# Patient Record
Sex: Male | Born: 1965
Health system: Southern US, Community
[De-identification: ages and names within clinical notes are randomized; demographics above are authoritative.]

## PROBLEM LIST (undated history)

## (undated) DIAGNOSIS — J309 Allergic rhinitis, unspecified: Secondary | ICD-10-CM

## (undated) DIAGNOSIS — IMO0002 Reserved for concepts with insufficient information to code with codable children: Secondary | ICD-10-CM

## (undated) DIAGNOSIS — K649 Unspecified hemorrhoids: Secondary | ICD-10-CM

## (undated) DIAGNOSIS — M722 Plantar fascial fibromatosis: Secondary | ICD-10-CM

## (undated) DIAGNOSIS — T7840XA Allergy, unspecified, initial encounter: Secondary | ICD-10-CM

## (undated) HISTORY — DX: Reserved for concepts with insufficient information to code with codable children: IMO0002

## (undated) HISTORY — PX: WISDOM TOOTH EXTRACTION: SHX21

## (undated) HISTORY — DX: Allergic rhinitis, unspecified: J30.9

## (undated) HISTORY — DX: Allergy, unspecified, initial encounter: T78.40XA

## (undated) HISTORY — DX: Unspecified hemorrhoids: K64.9

## (undated) HISTORY — DX: Plantar fascial fibromatosis: M72.2

---

## 2009-05-26 ENCOUNTER — Ambulatory Visit: Payer: Self-pay | Admitting: Family Medicine

## 2009-05-26 DIAGNOSIS — Z8711 Personal history of peptic ulcer disease: Secondary | ICD-10-CM

## 2009-05-26 DIAGNOSIS — Z87898 Personal history of other specified conditions: Secondary | ICD-10-CM | POA: Insufficient documentation

## 2009-05-26 DIAGNOSIS — R519 Headache, unspecified: Secondary | ICD-10-CM | POA: Insufficient documentation

## 2009-05-26 DIAGNOSIS — R51 Headache: Secondary | ICD-10-CM

## 2009-05-26 DIAGNOSIS — J309 Allergic rhinitis, unspecified: Secondary | ICD-10-CM | POA: Insufficient documentation

## 2010-03-21 NOTE — Assessment & Plan Note (Signed)
Summary: NEW PT TO EST/CLE   Vital Signs:  Patient profile:   45 year old male Height:      73 inches Weight:      183 pounds BMI:     24.23 Temp:     98.2 degrees F oral Pulse rate:   72 / minute Pulse rhythm:   regular BP sitting:   120 / 78  (left arm) Cuff size:   regular  Vitals Entered By: Benny Lennert CMA Duncan Dull) (May 26, 2009 9:57 AM)  History of Present Illness: Chief complaint new patient to be established   Emergency planning/management officer.  Allergies  Migraines  Very bad allergies. Took allergy shots. Started to see Dr.  Callas. Still having a lot of drainage and sneezing.  A lot of blood and dryness.   Right now doing some saline. OTC sprays. Has been on Claritin of Allegra. Zyrtec.   Has tried some of the presciption nasal. Ongoing all year long.     Preventive Screening-Counseling & Management  Alcohol-Tobacco     Smoking Status: never  Caffeine-Diet-Exercise     Does Patient Exercise: no      Drug Use:  no.    Allergies (verified): No Known Drug Allergies  Past History:  Past Medical History: ALLERGIC RHINITIS (ICD-477.9) GASTRIC ULCER, HX OF (ICD-V12.71) Migraine  Past Surgical History: none  Family History: Family History Hypertension Family History of Cardiovascular disorder  Social History: Occupation: city of Armed forces operational officer, Cabin crew Married Never Smoked Alcohol use-no Drug use-no Regular exercise-no Occupation:  employed Smoking Status:  never Drug Use:  no Does Patient Exercise:  no  Review of Systems       allergies as above ROS: GEN: No acute illnesses, no fevers, chills, sweats, fatigue, weight loss, or URI sx. GI: No n/v/d Pulm: No SOB, cough, wheezing Interactive and getting along well at home.  Otherwise, ROS is as per the HPI.   Physical Exam  General:  Well-developed,well-nourished,in no acute distress; alert,appropriate and cooperative throughout examination Head:  Normocephalic and atraumatic without obvious  abnormalities. No apparent alopecia or balding. Ears:  External ear exam shows no significant lesions or deformities.  Otoscopic examination reveals clear canals, tympanic membranes are intact bilaterally without bulging, retraction, inflammation or discharge. Hearing is grossly normal bilaterally. Nose:  boggy swollen turbinates Mouth:  Oral mucosa and oropharynx without lesions or exudates.  Teeth in good repair. Neck:  No deformities, masses, or tenderness noted. Lungs:  Normal respiratory effort, chest expands symmetrically. Lungs are clear to auscultation, no crackles or wheezes. Heart:  Normal rate and regular rhythm. S1 and S2 normal without gallop, murmur, click, rub or other extra sounds. Extremities:  No clubbing, cyanosis, edema, or deformity noted with normal full range of motion of all joints.   Neurologic:  alert & oriented X3 and gait normal.   Cervical Nodes:  No lymphadenopathy noted Psych:  Cognition and judgment appear intact. Alert and cooperative with normal attention span and concentration. No apparent delusions, illusions, hallucinations   Impression & Recommendations:  Problem # 1:  ALLERGIC RHINITIS (ICD-477.9) Assessment New poor control, vasomotor rhinitis  Clarinex, Singular, Astepro  We will obtain records from the patient's prior physicians, reviewed Dr. Blair Heys records  His updated medication list for this problem includes:    Clarinex 5 Mg Tabs (Desloratadine) .Marland Kitchen... 1 by mouth daily (failed multiple meds, zyrtec, allegra, claritin, benadryl, nasocort, flonase)    Astepro 0.15 % Soln (Azelastine hcl) .Marland Kitchen... 2 sprays each nostril two times a day  Problem # 2:  HEADACHE (ICD-784.0) Assessment: New  His updated medication list for this problem includes:    Imitrex 50 Mg Tabs (Sumatriptan succinate) .Marland Kitchen... As needed for headache  Complete Medication List: 1)  Imitrex 50 Mg Tabs (Sumatriptan succinate) .... As needed for headache 2)  Singulair 10 Mg Tabs  (Montelukast sodium) .Marland Kitchen.. 1 by mouth daily 3)  Clarinex 5 Mg Tabs (Desloratadine) .Marland Kitchen.. 1 by mouth daily (failed multiple meds, zyrtec, allegra, claritin, benadryl, nasocort, flonase) 4)  Astepro 0.15 % Soln (Azelastine hcl) .... 2 sprays each nostril two times a day  Patient Instructions: 1)  CPX at your convenience 2)  Prephysical Labs, several days before, fasting 3)  BMP, HFP, FLP, CBC with diff, TSH, PSA: v77.91, v77.1, ,780.79, v76.44  Prescriptions: ASTEPRO 0.15 % SOLN (AZELASTINE HCL) 2 sprays each nostril two times a day  #1 x 11   Entered and Authorized by:   Hannah Beat MD   Signed by:   Hannah Beat MD on 05/26/2009   Method used:   Print then Give to Patient   RxID:   [id number] CLARINEX 5 MG TABS (DESLORATADINE) 1 by mouth daily (failed multiple meds, Zyrtec, Allegra, Claritin, Benadryl, Nasocort, FLonase)  #30 x 11   Entered and Authorized by:   Hannah Beat MD   Signed by:   Hannah Beat MD on 05/26/2009   Method used:   Print then Give to Patient   RxID:   [id number] SINGULAIR 10 MG TABS (MONTELUKAST SODIUM) 1 by mouth daily  #30 x 11   Entered and Authorized by:   Hannah Beat MD   Signed by:   Hannah Beat MD on 05/26/2009   Method used:   Print then Give to Patient   RxID:   [id number]   Current Allergies (reviewed today): No known allergies

## 2010-03-21 NOTE — Letter (Signed)
Summary: Records Dated 09-11-99 thru 08-27-07/James Kindl MD  Records Dated 09-11-99 thru 08-27-07/James Kindl MD   Imported By: Lanelle Bal 10/20/2009 11:49:37  _____________________________________________________________________  External Attachment:    Type:   Image     Comment:   External Document

## 2010-05-03 ENCOUNTER — Ambulatory Visit (INDEPENDENT_AMBULATORY_CARE_PROVIDER_SITE_OTHER): Payer: Self-pay | Admitting: Family Medicine

## 2010-05-03 ENCOUNTER — Encounter: Payer: Self-pay | Admitting: Family Medicine

## 2010-05-03 DIAGNOSIS — G43009 Migraine without aura, not intractable, without status migrainosus: Secondary | ICD-10-CM | POA: Insufficient documentation

## 2010-05-03 DIAGNOSIS — J309 Allergic rhinitis, unspecified: Secondary | ICD-10-CM

## 2010-05-09 NOTE — Assessment & Plan Note (Signed)
Summary: migraines/alc   Vital Signs:  Patient profile:   45 year old male Height:      73 inches Weight:      187 pounds BMI:     24.76 Temp:     97.9 degrees F oral Pulse rate:   72 / minute Pulse rhythm:   regular BP sitting:   120 / 78  (left arm) Cuff size:   regular  Vitals Entered By: Benny Lennert CMA Duncan Dull) (May 03, 2010 10:03 AM)  History of Present Illness: Chief complaint migraines  45 year old male:  migraines recently had a migraine, out of imitrex  allergic rhinitis: has had bleeding in his nose, occ. bloody discharge. singulair did not help.  zyrtec helps, astepro helped  Headache HPI:      The patient comes in for chronic management of stable headaches.  Since the last visit, the frequency of headaches have increased.  He has approximately 1 headaches per month.        Headache quality is throbbing or pulsating.  Precipitating factors consist of stress.  The headaches are associated with nausea, vomiting, and photophobia.        Positive alarm features include change in frequency from prior H/A's.  The patient denies first or worst H/A of life, change in severity from prior H/A's, change in features from prior H/A's, new onset H/A's in middle-age or later, new or progressive H/A lasting days, H/A's with Valsalva (cough/sneeze), mylagia, fever, malaise, weight loss, scalp tenderness, jaw claudication, focal neurologic symptoms, confusion, seizures, and impaired level of consciousness.         Headache Treatment History:      He has tried sumatriptan (Imitrex) which was effective.    Allergies (verified): No Known Drug Allergies  Past History:  Past medical, surgical, family and social histories (including risk factors) reviewed, and no changes noted (except as noted below).  Past Medical History: Reviewed history from 05/26/2009 and no changes required. ALLERGIC RHINITIS (ICD-477.9) GASTRIC ULCER, HX OF (ICD-V12.71) Migraine  Past Surgical  History: Reviewed history from 05/26/2009 and no changes required. none  Family History: Reviewed history from 05/26/2009 and no changes required. Family History Hypertension Family History of Cardiovascular disorder  Social History: Reviewed history from 05/26/2009 and no changes required. Occupation: city of Armed forces operational officer, Police Married Never Smoked Alcohol use-no Drug use-no Regular exercise-no  Review of Systems       REVIEW OF SYSTEMS GEN: No acute illnesses, no fever, chills, sweats. CV: No chest pain or SOB GI: n with ha Otherwise, pertinent positives and negatives are noted in the HPI.   Physical Exam  Additional Exam:  GEN: WDWN, NAD, Non-toxic, A & O x 3 HEENT: Atraumatic, Normocephalic. Neck supple. No masses, No LAD. Ears and Nose: No external deformity. CV: RRR, No M/G/R. No JVD. No thrill. No extra heart sounds. PULM: CTA B, no wheezes, crackles, rhonchi. No retractions. No resp. distress. No accessory muscle use. EXTR: No c/c/e NEURO: Normal gait.  PSYCH: Normally interactive. Conversant. Not depressed or anxious appearing.  Calm demeanor.     Impression & Recommendations:  Problem # 1:  MIGRAINE WITHOUT AURA (ICD-346.10) Assessment Deteriorated imitrex subcutaneously working well for acute 1 ha in the last year  The following medications were removed from the medication list:    Imitrex 50 Mg Tabs (Sumatriptan succinate) .Marland Kitchen... As needed for headache His updated medication list for this problem includes:    Sumatriptan Succinate Refill 6 Mg/0.106ml Soln (Sumatriptan succinate) .Marland KitchenMarland KitchenMarland KitchenMarland Kitchen 1  subq injection at onset of ha, may repeat in 2 hours  Problem # 2:  ALLERGIC RHINITIS (ICD-477.9) Assessment: Deteriorated c/w below refilled astelin  The following medications were removed from the medication list:    Clarinex 5 Mg Tabs (Desloratadine) .Marland Kitchen... 1 by mouth daily (failed multiple meds, zyrtec, allegra, claritin, benadryl, nasocort, flonase)    Astepro 0.15  % Soln (Azelastine hcl) .Marland Kitchen... 2 sprays each nostril two times a day His updated medication list for this problem includes:    Zyrtec Hives Relief 10 Mg Tabs (Cetirizine hcl) .Marland Kitchen... Take one tablet daily for allergies    Azelastine Hcl 137 Mcg/spray Soln (Azelastine hcl) .Marland Kitchen... 1 spray each nostril two times a day  Complete Medication List: 1)  Zyrtec Hives Relief 10 Mg Tabs (Cetirizine hcl) .... Take one tablet daily for allergies 2)  Sumatriptan Succinate Refill 6 Mg/0.37ml Soln (Sumatriptan succinate) .Marland Kitchen.. 1 subq injection at onset of ha, may repeat in 2 hours 3)  Azelastine Hcl 137 Mcg/spray Soln (Azelastine hcl) .Marland Kitchen.. 1 spray each nostril two times a day  Patient Instructions: 1)  CLARITIN, ZYRTEC, OR ALLEGRA. 2)  f/u CPX Prescriptions: AZELASTINE HCL 137 MCG/SPRAY SOLN (AZELASTINE HCL) 1 spray each nostril two times a day  #1 x 11   Entered and Authorized by:   Hannah Beat MD   Signed by:   Hannah Beat MD on 05/03/2010   Method used:   Print then Give to Patient   RxID:   [id number] SUMATRIPTAN SUCCINATE REFILL 6 MG/0.5ML SOLN (SUMATRIPTAN SUCCINATE) 1 subq injection at onset of HA, may repeat in 2 hours  #11 x 11   Entered and Authorized by:   Hannah Beat MD   Signed by:   Hannah Beat MD on 05/03/2010   Method used:   Electronically to        [facility]* (retail)       [address]       [place], [place]  [address]       Ph: [phone]       Fax: [phone]   RxID:   [id number]    Orders Added: 1)  Est. Patient Level IV [06301]    Current Allergies (reviewed today): No known allergies

## 2011-04-02 ENCOUNTER — Telehealth: Payer: Self-pay | Admitting: Family Medicine

## 2011-04-02 DIAGNOSIS — Z0000 Encounter for general adult medical examination without abnormal findings: Secondary | ICD-10-CM

## 2011-04-02 DIAGNOSIS — Z209 Contact with and (suspected) exposure to unspecified communicable disease: Secondary | ICD-10-CM

## 2011-04-02 NOTE — Telephone Encounter (Signed)
Same as husband

## 2011-04-02 NOTE — Telephone Encounter (Signed)
Labs ordered for future, patient notified

## 2011-04-02 NOTE — Telephone Encounter (Signed)
Patient has a physical appointment scheduled for 04/23/11 with labs prior.  Patient does not know what labs are ordered for physicals, but he needs the following labs done for an adoption.  Could you please add these labs to the order if necessary:  HB Antigen; HIV; Blood Count HB HCT WBC; Urinalysis

## 2011-04-02 NOTE — Telephone Encounter (Signed)
Can you help with this? For the HIV, etc. Can you use  "exposure communicable disease"

## 2011-04-16 ENCOUNTER — Other Ambulatory Visit (INDEPENDENT_AMBULATORY_CARE_PROVIDER_SITE_OTHER): Payer: 59

## 2011-04-16 DIAGNOSIS — Z209 Contact with and (suspected) exposure to unspecified communicable disease: Secondary | ICD-10-CM

## 2011-04-16 DIAGNOSIS — Z0000 Encounter for general adult medical examination without abnormal findings: Secondary | ICD-10-CM

## 2011-04-16 LAB — CBC WITH DIFFERENTIAL/PLATELET
Basophils Absolute: 0 10*3/uL (ref 0.0–0.1)
HCT: 42.8 % (ref 39.0–52.0)
Lymphs Abs: 1.5 10*3/uL (ref 0.7–4.0)
Monocytes Relative: 9.6 % (ref 3.0–12.0)
Neutrophils Relative %: 55.3 % (ref 43.0–77.0)
Platelets: 174 10*3/uL (ref 150.0–400.0)
RDW: 13.5 % (ref 11.5–14.6)

## 2011-04-16 LAB — URINALYSIS, ROUTINE W REFLEX MICROSCOPIC
Bilirubin Urine: NEGATIVE
Ketones, ur: NEGATIVE
Leukocytes, UA: NEGATIVE
Specific Gravity, Urine: 1.025 (ref 1.000–1.030)
Urobilinogen, UA: 0.2 (ref 0.0–1.0)

## 2011-04-17 LAB — HIV ANTIBODY (ROUTINE TESTING W REFLEX): HIV: NONREACTIVE

## 2011-04-20 ENCOUNTER — Encounter: Payer: Self-pay | Admitting: Family Medicine

## 2011-04-23 ENCOUNTER — Encounter: Payer: Self-pay | Admitting: Family Medicine

## 2011-04-23 ENCOUNTER — Ambulatory Visit (INDEPENDENT_AMBULATORY_CARE_PROVIDER_SITE_OTHER): Payer: 59 | Admitting: Family Medicine

## 2011-04-23 VITALS — BP 120/72 | HR 61 | Temp 97.8°F | Ht 74.0 in | Wt 182.0 lb

## 2011-04-23 DIAGNOSIS — Z0000 Encounter for general adult medical examination without abnormal findings: Secondary | ICD-10-CM

## 2011-04-23 DIAGNOSIS — Z125 Encounter for screening for malignant neoplasm of prostate: Secondary | ICD-10-CM

## 2011-04-23 LAB — PSA: PSA: 0.58 ng/mL (ref 0.10–4.00)

## 2011-04-23 LAB — LIPID PANEL
Cholesterol: 232 mg/dL — ABNORMAL HIGH (ref 0–200)
Total CHOL/HDL Ratio: 6
Triglycerides: 121 mg/dL (ref 0.0–149.0)
VLDL: 24.2 mg/dL (ref 0.0–40.0)

## 2011-04-23 LAB — LDL CHOLESTEROL, DIRECT: Direct LDL: 166.2 mg/dL

## 2011-04-23 LAB — BASIC METABOLIC PANEL
CO2: 28 mEq/L (ref 19–32)
Calcium: 9.5 mg/dL (ref 8.4–10.5)
Creatinine, Ser: 1 mg/dL (ref 0.4–1.5)

## 2011-04-23 NOTE — Progress Notes (Signed)
Patient Name: Ian Golden Date of Birth: 01-26-1966 Age: 46 y.o. Medical Record Number: [id number] Gender: male Date of Encounter: 04/23/2011  History of Present Illness:  Ian Golden is a 46 y.o. very pleasant male patient who presents with the following:  Flare up of some hemorrhoids  Last week back was aching some.   Father had colon polyps but no cancer - and removed frequently.  92 and still alive  Preventative Health Maintenance Visit:  Health Maintenance Summary Reviewed and updated, unless pt declines services.  Tobacco History Reviewed. Alcohol: No concerns, no excessive use Exercise Habits: Some activity, rec at least 30 mins 5 times a week STD concerns: no risk or activity to increase risk Drug Use: None Encouraged self-testicular check  Labs reviewed with the patient.   Lipids:    Component Value Date/Time   CHOL 232* 04/23/2011 0904   TRIG 121.0 04/23/2011 0904   HDL 39.40 04/23/2011 0904   LDLDIRECT 166.2 04/23/2011 0904   VLDL 24.2 04/23/2011 0904   CHOLHDL 6 04/23/2011 0904    CBC:    Component Value Date/Time   WBC 5.1 04/16/2011 0752   HGB 14.5 04/16/2011 0752   HCT 42.8 04/16/2011 0752   PLT 174.0 04/16/2011 0752   MCV 88.5 04/16/2011 0752   NEUTROABS 2.8 04/16/2011 0752   LYMPHSABS 1.5 04/16/2011 0752   MONOABS 0.5 04/16/2011 0752   EOSABS 0.3 04/16/2011 0752   BASOSABS 0.0 04/16/2011 0752    Basic Metabolic Panel:    Component Value Date/Time   NA 141 04/23/2011 0904   K 4.3 04/23/2011 0904   CL 106 04/23/2011 0904   CO2 28 04/23/2011 0904   BUN 20 04/23/2011 0904   CREATININE 1.0 04/23/2011 0904   GLUCOSE 86 04/23/2011 0904   CALCIUM 9.5 04/23/2011 0904    Lab Results  Component Value Date   PSA 0.58 04/23/2011   Patient Active Problem List  Diagnoses  . ALLERGIC RHINITIS  . GASTRIC ULCER, HX OF  . SHINGLES, HX OF  . MIGRAINE WITHOUT AURA   Past Medical History  Diagnosis Date  . Allergic rhinitis, cause unspecified   . Ulcer   . Migraine    No  past surgical history on file. History  Substance Use Topics  . Smoking status: Never Smoker   . Smokeless tobacco: Not on file  . Alcohol Use: No   No family history on file. No Known Allergies No current outpatient prescriptions on file prior to visit.     Past Medical History, Surgical History, Social History, Family History, Problem List, Medications, and Allergies have been reviewed and updated if relevant.  Review of Systems:  General: Denies fever, chills, sweats. No significant weight loss. Eyes: Denies blurring,significant itching ENT: Denies earache, sore throat, and hoarseness. Cardiovascular: Denies chest pains, palpitations, dyspnea on exertion Respiratory: Denies cough, dyspnea at rest,wheeezing Breast: no concerns about lumps GI: Denies nausea, vomiting, diarrhea, constipation, change in bowel habits, abdominal pain, melena, hematochezia GU: Denies penile discharge, ED, urinary flow / outflow problems. No STD concerns. Musculoskeletal: Denies back pain, joint pain - back pain last week Derm: Denies rash, itching Neuro: Denies  paresthesias, frequent falls, frequent headaches Psych: Denies depression, anxiety Endocrine: Denies cold intolerance, heat intolerance, polydipsia Heme: Denies enlarged lymph nodes Allergy: No hayfever  Physical Examination: Filed Vitals:   04/23/11 0810  BP: 120/72  Pulse: 61  Temp: 97.8 F (36.6 C)  TempSrc: Oral  Height: 6\' 2"  (1.88 m)  Weight: 182 lb (82.555  kg)  SpO2: 100%    Body mass index is 23.37 kg/(m^2).   Wt Readings from Last 3 Encounters:  04/23/11 182 lb (82.555 kg)  05/03/10 187 lb (84.823 kg)  05/26/09 183 lb (83.008 kg)    GEN: well developed, well nourished, no acute distress Eyes: conjunctiva and lids normal, PERRLA, EOMI ENT: TM clear, nares clear, oral exam WNL Neck: supple, no lymphadenopathy, no thyromegaly, no JVD Pulm: clear to auscultation and percussion, respiratory effort normal CV: regular  rate and rhythm, S1-S2, no murmur, rub or gallop, no bruits, peripheral pulses normal and symmetric, no cyanosis, clubbing, edema or varicosities Chest: no scars, masses GI: soft, non-tender; no hepatosplenomegaly, masses; active bowel sounds all quadrants GU: no hernia, testicular mass, penile discharge, or prostate enlargement Lymph: no cervical, axillary or inguinal adenopathy MSK: gait normal, muscle tone and strength WNL, no joint swelling, effusions, discoloration, crepitus  SKIN: clear, good turgor, color WNL, no rashes, lesions, or ulcerations Neuro: normal mental status, normal strength, sensation, and motion Psych: alert; oriented to person, place and time, normally interactive and not anxious or depressed in appearance.   Assessment and Plan: 1. Routine general medical examination at a health care facility  Lipid panel, Basic metabolic panel, PSA  2. Special screening for malignant neoplasm of prostate  PSA    The patient's preventative maintenance and recommended screening tests for an annual wellness exam were reviewed in full today. Brought up to date unless services declined.  Counselled on the importance of diet, exercise, and its role in overall health and mortality. The patient's FH and SH was reviewed, including their home life, tobacco status, and drug and alcohol status.  Doing well. Cont to exercise.  All paperwork for adoption CPX completed and scanned.

## 2011-05-31 ENCOUNTER — Other Ambulatory Visit: Payer: Self-pay | Admitting: *Deleted

## 2011-05-31 MED ORDER — SUMATRIPTAN SUCCINATE REFILL 6 MG/0.5ML ~~LOC~~ SOCT: 1.0000 | SUBCUTANEOUS | Status: DC | PRN

## 2011-06-13 ENCOUNTER — Ambulatory Visit (INDEPENDENT_AMBULATORY_CARE_PROVIDER_SITE_OTHER): Payer: 59 | Admitting: Family Medicine

## 2011-06-13 ENCOUNTER — Encounter: Payer: Self-pay | Admitting: Family Medicine

## 2011-06-13 ENCOUNTER — Telehealth: Payer: Self-pay | Admitting: Family Medicine

## 2011-06-13 VITALS — BP 120/86 | HR 83 | Temp 98.7°F | Ht 74.0 in | Wt 193.0 lb

## 2011-06-13 DIAGNOSIS — M6788 Other specified disorders of synovium and tendon, other site: Secondary | ICD-10-CM

## 2011-06-13 DIAGNOSIS — M7660 Achilles tendinitis, unspecified leg: Secondary | ICD-10-CM

## 2011-06-13 MED ORDER — NITROGLYCERIN 0.2 MG/HR TD PT24: MEDICATED_PATCH | TRANSDERMAL | Status: DC

## 2011-06-13 NOTE — Telephone Encounter (Signed)
Billing questions with labs to be addressed.

## 2011-06-13 NOTE — Progress Notes (Signed)
  Patient Name: Ian Golden Date of Birth: [birth year] Age: 46 y.o. Medical Record Number: [id number] Gender: male Date of Encounter: 06/13/2011  History of Present Illness:  Ian Golden is a 46 y.o. very pleasant male patient who presents with the following:  R achilles tendinopathy:  Pleasant patient who presents with a several month history of posterior heel pain.  No occult, abrupt onset. Has been more insidious in character. There is a dull ache present and worse with activity:  Prior home rehab: none Prior meds: none, occ prn tylenol and nsaids Orthosis / Braces: none  He does have a prominent posterior heel bone.  Past Medical History, Surgical History, Social History, Family History, Problem List, Medications, and Allergies have been reviewed and updated if relevant.  Review of Systems:  GEN: No fevers, chills. Nontoxic. Primarily MSK c/o today. MSK: Detailed in the HPI GI: tolerating PO intake without difficulty Neuro: No numbness, parasthesias, or tingling associated. Otherwise the pertinent positives of the ROS are noted above.    Physical Examination: Filed Vitals:   06/13/11 1522  BP: 120/86  Pulse: 83  Temp: 98.7 F (37.1 C)  TempSrc: Oral  Height: 6\' 2"  (1.88 m)  Weight: 193 lb (87.544 kg)  SpO2: 98%    Body mass index is 24.78 kg/(m^2).   GEN: Well-developed,well-nourished,in no acute distress; alert,appropriate and cooperative throughout examination HEENT: Normocephalic and atraumatic without obvious abnormalities. Ears, externally no deformities PULM: Breathing comfortably in no respiratory distress EXT: No clubbing, cyanosis, or edema PSYCH: Normally interactive. Cooperative during the interview. Pleasant. Friendly and conversant. Not anxious or depressed appearing. Normal, full affect.  Foot: b Echymosis: no Edema: no ROM: full LE B Gait: heel toe, non-antalgic MT pain: no Callus pattern: none Lateral Mall: NT Medial Mall: NT Talus:  NT Navicular: NT Cuboid: NT Calcaneous: NT Metatarsals: NT 5th MT: NT Phalanges: NT Achilles: PAINFUL TO PALPATE AT INSERTION ON LEFT, moderate NODULE on the R Plantar Fascia: NT Fat Pad: NT Peroneals: NT Post Tib: NT Great Toe: Nml motion Ant Drawer: neg ATFL: NT CFL: NT Deltoid: NT Sensation: intact   Assessment and Plan:  1. Achilles tendinosis    Achilles Tendinopathy:  Pathophysiology of achilles tendinopathy reviewed.  Additionally, I have given the patient the program emphasizing eccentric overloading detailed in the instructions based on Dr. Renato Gails work and protocols.  NTG patches  Recheck 6 weeks.   Orders Today: No orders of the defined types were placed in this encounter.    Medications Today: Meds ordered this encounter  Medications  . nitroGLYCERIN (NITRODUR - DOSED IN MG/24 HR) 0.2 mg/hr    Sig: NITRODUR brand. :Apply 1/4 of a patch to the affected area and change every 24 hours (re: tendinopathy)    Dispense:  30 patch    Refill:  4

## 2011-06-13 NOTE — Patient Instructions (Signed)
Achilles Rehab  Begin with easy walking, heel, toe and backwards  Calf raises on a step First lower and then raise on 1 foot If this is painful lower on 1 foot but do the heel raise on both feet  Begin with 3 sets of 10 repetitions  Increase by 5 repetitions every 3 days  Goal is 3 sets of 30 repetitions  Do with both knee straight and knee at 20 degrees of flexion  If pain persists at 3 sets of 30 - add backpack with 5 lbs Increase by 5 lbs per week to max of 30 lbs  

## 2011-06-18 ENCOUNTER — Telehealth: Payer: Self-pay | Admitting: Family Medicine

## 2011-07-05 ENCOUNTER — Telehealth: Payer: Self-pay | Admitting: Family Medicine

## 2011-07-05 NOTE — Telephone Encounter (Signed)
The patient's questions were forwarded to billing for review and awaiting response.  Meantime, patient has called to check on status and I explained I would send to another person/supervisor for response.  Sent to Anabell Diaz and Susan Large for review. 

## 2011-07-26 ENCOUNTER — Telehealth: Payer: Self-pay | Admitting: Family Medicine

## 2011-07-26 NOTE — Telephone Encounter (Signed)
Terri assisted with contacting [facility] and their labs were going to recode and rebill his and his wife's and said it was taken care of.  Informed patient of the updates and suggested that he may want to call [facility].  Patient was appreciative and verbalized understanding.

## 2011-10-17 ENCOUNTER — Encounter: Payer: Self-pay | Admitting: Family Medicine

## 2011-10-17 ENCOUNTER — Ambulatory Visit (INDEPENDENT_AMBULATORY_CARE_PROVIDER_SITE_OTHER): Payer: 59 | Admitting: Family Medicine

## 2011-10-17 VITALS — BP 138/82 | HR 76 | Temp 98.0°F | Wt 189.0 lb

## 2011-10-17 DIAGNOSIS — M7660 Achilles tendinitis, unspecified leg: Secondary | ICD-10-CM

## 2011-10-17 MED ORDER — SUMATRIPTAN SUCCINATE REFILL 6 MG/0.5ML ~~LOC~~ SOCT: 1.0000 | SUBCUTANEOUS | Status: DC | PRN

## 2011-10-17 NOTE — Patient Instructions (Addendum)
[facility]'s HEEL CUPS (I get them on [employer])  REFERRAL: GO THE THE FRONT ROOM AT THE ENTRANCE OF OUR CLINIC, NEAR CHECK IN. ASK FOR MARION. SHE WILL HELP YOU SET UP YOUR REFERRAL. DATE: TIME:

## 2011-10-17 NOTE — Progress Notes (Signed)
Nature conservation officer at [facility] [address] [place] [address] Phone: [phone] Fax: [phone]  Date:  10/17/2011   Name:  Ian Golden   DOB:  Nov 21, 1965   MRN:  [id number] Gender: male Age: 46 y.o.  PCP:  Hannah Beat, MD    Chief Complaint: Follow up for achilles heel pain   History of Present Illness:  Ian Golden is a 46 y.o. very pleasant male patient who presents with the following:  F/u on achilles tendinopathy, seen in 05/2011 with a prominent nodule, has been doing NTG patches, rehab with some eccentrics, trying to stretch, with continued significant pain and nodule. Limiting his activity  Past Medical History, Surgical History, Social History, Family History, Problem List, Medications, and Allergies have been reviewed and updated if relevant.  Current Outpatient Prescriptions on File Prior to Visit  Medication Sig Dispense Refill  . nitroGLYCERIN (NITRODUR - DOSED IN MG/24 HR) 0.2 mg/hr NITRODUR brand. :Apply 1/4 of a patch to the affected area and change every 24 hours (re: tendinopathy)  30 patch  4  . SUMAtriptan Succinate Refill 6 MG/0.5ML SOLN Inject 1 Syringe into the skin as needed. Inject one syringe subcutaneously at onset of headache. May repeat in 2 hours if needed as directedInject one syringe subcutaneously at onset of headache. May repeat in 2 hours if needed as directed  5 Syringe  0    Review of Systems:  GEN: No fevers, chills. Nontoxic. Primarily MSK c/o today. MSK: Detailed in the HPI GI: tolerating PO intake without difficulty Neuro: No numbness, parasthesias, or tingling associated. Otherwise the pertinent positives of the ROS are noted above.    Physical Examination: Filed Vitals:   10/17/11 1520  BP: 138/82  Pulse: 76  Temp: 98 F (36.7 C)   Filed Vitals:   10/17/11 1520  Weight: 189 lb (85.73 kg)   There is no height on file to calculate BMI. Ideal Body Weight:     GEN: Well-developed,well-nourished,in no  acute distress; alert,appropriate and cooperative throughout examination HEENT: Normocephalic and atraumatic without obvious abnormalities. Ears, externally no deformities PULM: Breathing comfortably in no respiratory distress EXT: No clubbing, cyanosis, or edema PSYCH: Normally interactive. Cooperative during the interview. Pleasant. Friendly and conversant. Not anxious or depressed appearing. Normal, full affect.  Foot: R Echymosis: no Edema: no ROM: full LE B Gait: heel toe, non-antalgic MT pain: no Callus pattern: none Lateral Mall: NT Medial Mall: NT Talus: NT Navicular: NT Cuboid: NT Calcaneous: NT Metatarsals: NT 5th MT: NT Phalanges: NT Achilles: PAINFUL TO PALPATE AT INSERTION ON LEFT, large NODULE Plantar Fascia: NT Fat Pad: NT Peroneals: NT Post Tib: NT Great Toe: Nml motion Ant Drawer: neg ATFL: NT CFL: NT Deltoid: NT Cavus long arch Sensation: intact  Diagnostic Ultrasound Evaluation Terason t3000, MSK ultrasound, MSK probe Anatomy scanned: R achilles Indication: Pain Findings: no evidence of tearing. There is a large nodule near the insertion. There is also some evidence of a small amount of retrocalcaneal bursitis visualized. On transverse view, there is a classical bull's-eye appearance around the nodule compared to the tendon more proximally.   Assessment and Plan:  1. Achilles tendinitis  Ambulatory referral to Physical Therapy   Six-month history of Achilles tendinopathy. Large nodule. Discussed at it will take some time on the order of months for the nodule to progress and become normal.  Physical therapy assistance.  Focus rehabilitation on eccentric draining at this point.  I made a Poron lift and both  of his shoes he was wearing advised him to get some heel cups for his others.  Orders Today:  Orders Placed This Encounter  Procedures  . Ambulatory referral to Physical Therapy    Referral Priority:  Routine    Referral Type:  Physical  Medicine    Referral Reason:  Specialty Services Required    Requested Specialty:  Physical Therapy    Number of Visits Requested:  1    Medications Today: (Includes new updates added during medication reconciliation) Meds ordered this encounter  Medications  . SUMAtriptan Succinate Refill 6 MG/0.5ML SOLN    Sig: Inject 1 Syringe into the skin as needed. Inject one syringe subcutaneously at onset of headache. May repeat in 2 hours if needed as directedInject one syringe subcutaneously at onset of headache. May repeat in 2 hours if needed as directed    Dispense:  5 Syringe    Refill:  5    Medications Discontinued: Medications Discontinued During This Encounter  Medication Reason  . SUMAtriptan Succinate Refill 6 MG/0.5ML SOLN Reorder     Hannah Beat, MD

## 2011-11-08 ENCOUNTER — Ambulatory Visit (INDEPENDENT_AMBULATORY_CARE_PROVIDER_SITE_OTHER): Payer: 59 | Admitting: Pediatrics

## 2011-11-08 DIAGNOSIS — Z7681 Expectant parent(s) prebirth pediatrician visit: Secondary | ICD-10-CM

## 2011-11-09 NOTE — Progress Notes (Signed)
Counseled on physical findings and interpretatoin of labs for possible adoptee

## 2011-12-19 ENCOUNTER — Telehealth: Payer: Self-pay

## 2011-12-19 NOTE — Telephone Encounter (Signed)
Pt has billing issue and request contact # ;pt given [phone].

## 2012-12-19 ENCOUNTER — Other Ambulatory Visit: Payer: Self-pay | Admitting: Family Medicine

## 2012-12-19 NOTE — Telephone Encounter (Signed)
Last office visit 10/17/2011.  Ok to refill?

## 2013-04-09 ENCOUNTER — Ambulatory Visit: Payer: 59 | Admitting: Family Medicine

## 2013-05-18 ENCOUNTER — Encounter: Payer: Self-pay | Admitting: Family Medicine

## 2013-05-18 ENCOUNTER — Ambulatory Visit (INDEPENDENT_AMBULATORY_CARE_PROVIDER_SITE_OTHER): Payer: 59 | Admitting: Family Medicine

## 2013-05-18 VITALS — BP 128/92 | HR 72 | Temp 98.3°F | Ht 73.5 in | Wt 188.2 lb

## 2013-05-18 DIAGNOSIS — K649 Unspecified hemorrhoids: Secondary | ICD-10-CM

## 2013-05-18 DIAGNOSIS — Z125 Encounter for screening for malignant neoplasm of prostate: Secondary | ICD-10-CM

## 2013-05-18 DIAGNOSIS — L239 Allergic contact dermatitis, unspecified cause: Secondary | ICD-10-CM

## 2013-05-18 DIAGNOSIS — Z131 Encounter for screening for diabetes mellitus: Secondary | ICD-10-CM

## 2013-05-18 DIAGNOSIS — Z13220 Encounter for screening for lipoid disorders: Secondary | ICD-10-CM

## 2013-05-18 DIAGNOSIS — R5383 Other fatigue: Secondary | ICD-10-CM

## 2013-05-18 DIAGNOSIS — L259 Unspecified contact dermatitis, unspecified cause: Secondary | ICD-10-CM

## 2013-05-18 DIAGNOSIS — Z0000 Encounter for general adult medical examination without abnormal findings: Secondary | ICD-10-CM

## 2013-05-18 DIAGNOSIS — R5381 Other malaise: Secondary | ICD-10-CM

## 2013-05-18 MED ORDER — FLUOCINONIDE 0.05 % EX OINT: 1.0000 "application " | TOPICAL_OINTMENT | Freq: Two times a day (BID) | CUTANEOUS | Status: DC

## 2013-05-18 MED ORDER — LIDOCAINE-HYDROCORTISONE ACE 3-0.5 % RE CREA: 1.0000 | TOPICAL_CREAM | Freq: Two times a day (BID) | RECTAL | Status: DC

## 2013-05-18 NOTE — Progress Notes (Signed)
Date:  05/18/2013   Name:  Ian Golden   DOB:  02-Jan-1966   MRN:  [id number] Gender: male Age: [age] y.o.  Primary Physician:  Hannah BeatSpencer Katrell Milhorn, MD   Chief Complaint: Annual Exam   Subjective:   History of Present Illness:  Ian Golden is a 48 y.o. pleasant patient who presents with the following:  Preventative Health Maintenance Visit and acute issues.  Health Maintenance Summary Reviewed and updated, unless pt declines services.  Tobacco History Reviewed. 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 Tetanus/tdap  10/02/1983  . Influenza Vaccine  09/19/2013     There is no immunization history on file for this patient.  Feeling like going to fall apart.   Tdap - ask the city. Flu - city provides.   Hemorrhoids. Anumantle. He is having a flare-up, and he does sometimes. Tried some preparation H, which has not helped all that much.  Rash - abd. This has been there many months. He did used to wear a gun belt in the region, but no direct contact. No historic eczema.    Patient Active Problem List   Diagnosis Date Noted  . Hemorrhoids 05/19/2013  . MIGRAINE WITHOUT AURA 05/03/2010  . ALLERGIC RHINITIS 05/26/2009  . GASTRIC ULCER, HX OF 05/26/2009  . SHINGLES, HX OF 05/26/2009    Past Medical History  Diagnosis Date  . Allergic rhinitis, cause unspecified   . Ulcer   . Migraine   . Hemorrhoids     No past surgical history on file.  History   Social History  . Marital Status: Single    Spouse Name: N/A    Number of Children: 2  . Years of Education: N/A   [place]up[place] [employer] [place]    [employer] Enforcement   Social History Main Topics  . Smoking status: Never Smoker   . Smokeless tobacco: Never Used    . Alcohol Use: No  . Drug Use: No  . Sexual Activity: Yes    Partners: Female   Other Topics Concern  . Not on file   Social History Narrative   Married, 2 adopted children   Hydrographic surveyorLaw Enforcement officer   Regular exercise- no    No family history on file.  No Known Allergies  Medication list has been reviewed and updated.  Review of Systems:  General: Denies fever, chills, sweats. No significant weight loss. Eyes: Denies blurring,significant itching ENT: Denies earache, sore throat, and hoarseness. Cardiovascular: Denies chest pains, palpitations, dyspnea on exertion Respiratory: Denies cough, dyspnea at rest,wheeezing Breast: no concerns about lumps GI: Denies nausea, vomiting, diarrhea, constipation, change in bowel habits, abdominal pain, melena, hematochezia. HEMORRHOIDS GU: Denies penile discharge, ED, urinary flow / outflow problems. No STD concerns. Musculoskeletal: Denies back pain, joint pain Derm: as above Neuro: Denies  paresthesias, frequent falls, frequent headaches Psych: Denies depression, anxiety Endocrine: Denies cold intolerance, heat intolerance, polydipsia Heme: Denies enlarged lymph nodes Allergy: No hayfever  Objective:   Physical Examination: BP 128/92  Pulse 72  Temp(Src) 98.3 F (36.8 C) (Oral)  Ht 6' 1.5" (1.867 m)  Wt 188 lb 4 oz (85.39 kg)  BMI 24.50 kg/m2 Ideal Body Weight: Weight in (lb) to have BMI = 25: 191.7  GEN: well developed, well nourished, no acute distress Eyes: conjunctiva and lids normal, PERRLA, EOMI ENT: TM clear, nares clear, oral exam WNL Neck: supple, no lymphadenopathy, no thyromegaly, no JVD Pulm: clear to auscultation and percussion, respiratory effort normal CV: regular rate and rhythm, S1-S2, no murmur, rub or gallop, no bruits, peripheral pulses normal and symmetric, no cyanosis, clubbing, edema or varicosities GI: soft, non-tender; no hepatosplenomegaly, masses; active bowel sounds all quadrants GU: no  hernia, testicular mass, penile discharge Lymph: no cervical, axillary or inguinal adenopathy MSK: gait normal, muscle tone and strength WNL, no joint swelling, effusions, discoloration, crepitus  SKIN: flat, scaly rash periumbilical region, 1 small area on L leg Neuro: normal mental status, normal strength, sensation, and motion Psych: alert; oriented to person, place and time, normally interactive and not anxious or depressed in appearance.  All labs reviewed with patient.  Lipids:    Component Value Date/Time   CHOL 232* 04/23/2011 0904   TRIG 121.0 04/23/2011 0904   HDL 39.40 04/23/2011 0904   LDLDIRECT 166.2 04/23/2011 0904   VLDL 24.2 04/23/2011 0904   CHOLHDL 6 04/23/2011 0904    CBC:    Component Value Date/Time   WBC 5.1 04/16/2011 0752   HGB 14.5 04/16/2011 0752   HCT 42.8 04/16/2011 0752   PLT 174.0 04/16/2011 0752   MCV 88.5 04/16/2011 0752   NEUTROABS 2.8 04/16/2011 0752   LYMPHSABS 1.5 04/16/2011 0752   MONOABS 0.5 04/16/2011 0752   EOSABS 0.3 04/16/2011 0752   BASOSABS 0.0 04/16/2011 0752    Basic Metabolic Panel:    Component Value Date/Time   NA 141 04/23/2011 0904   K 4.3 04/23/2011 0904   CL 106 04/23/2011 0904   CO2 28 04/23/2011 0904   BUN 20 04/23/2011 0904   CREATININE 1.0 04/23/2011 0904   GLUCOSE 86 04/23/2011 0904   CALCIUM 9.5 04/23/2011 0904    No results found for this basename: ALT, AST, GGT, ALKPHOS, BILITOT    No results found for this basename: TSH    Lab Results  Component Value Date   PSA 0.58 04/23/2011    Assessment & Plan:   Health Maintenance Exam: The patient's preventative maintenance and recommended screening tests for an annual wellness exam were reviewed in full today. Brought up to date unless services declined.  Counselled on the importance of diet, exercise, and its role in overall health and mortality. The patient's FH and SH was reviewed, including their home life, tobacco status, and drug and alcohol status.  Routine general medical  examination at a health care facility  Special screening for malignant neoplasm of prostate - Plan: PSA  Screening for lipoid disorders - Plan: Lipid panel  Screening for diabetes mellitus - Plan: Basic metabolic panel  Other malaise and fatigue - Plan: CBC with Differential, Hepatic function panel  Hemorrhoids  Allergic dermatitis  Overall doing well  Likely rash dermatitis, less likely fungal. Lidex  Anamantle for hemorrhoids.  Increase exercise, work on weight.  Return in about 1 year (around 05/19/2014).  Orders Placed This Encounter  Procedures  . Basic metabolic panel  . CBC with Differential  . Hepatic function panel  . Lipid panel  . PSA   Patient's Medications  New Prescriptions   FLUOCINONIDE OINTMENT (LIDEX) 0.05 %    Apply 1 application topically 2 (two) times daily.   LIDOCAINE-HYDROCORTISONE (ANAMANTEL HC) 3-0.5 % CREA    Place 1 Applicatorful rectally 2 (two) times daily.  Previous Medications   SUMATRIPTAN SUCCINATE  REFILL 6 MG/0.5ML SOLN    Inject 1 Syringe into the skin as needed. Inject one syringe subcutaneously at onset of headache. May repeat in 2 hours if needed as directedInject one syringe subcutaneously at onset of headache. May repeat in 2 hours if needed as directed  Modified Medications   No medications on file  Discontinued Medications   NITROGLYCERIN (NITRODUR - DOSED IN MG/24 HR) 0.2 MG/HR    NITRODUR brand. :Apply 1/4 of a patch to the affected area and change every 24 hours (re: tendinopathy)   SUMATRIPTAN 6 MG/0.5ML SOAJ    INJECT ONE (1) SYRINGE SUBCUTANEOUSLY ATONSET OF HEADACHE. MAY REPEATIN TWO (2)HOURS IF NEEDED AS DIRECTED.   Patient Instructions  F/u for labs   Signed,  Karleen Hampshire T. Wynona Duhamel, MD, CAQ Sports Medicine  [employer] at [facility] [address] [place] [address] Phone: [phone] Fax: [phone]

## 2013-05-18 NOTE — Patient Instructions (Signed)
F/u for labs.

## 2013-05-18 NOTE — Progress Notes (Signed)
Pre visit review using our clinic review tool, if applicable. No additional management support is needed unless otherwise documented below in the visit note. 

## 2013-05-19 ENCOUNTER — Other Ambulatory Visit (INDEPENDENT_AMBULATORY_CARE_PROVIDER_SITE_OTHER): Payer: 59

## 2013-05-19 ENCOUNTER — Encounter: Payer: Self-pay | Admitting: Family Medicine

## 2013-05-19 DIAGNOSIS — Z131 Encounter for screening for diabetes mellitus: Secondary | ICD-10-CM

## 2013-05-19 DIAGNOSIS — K649 Unspecified hemorrhoids: Secondary | ICD-10-CM | POA: Insufficient documentation

## 2013-05-19 DIAGNOSIS — R5381 Other malaise: Secondary | ICD-10-CM

## 2013-05-19 DIAGNOSIS — Z125 Encounter for screening for malignant neoplasm of prostate: Secondary | ICD-10-CM

## 2013-05-19 DIAGNOSIS — Z13220 Encounter for screening for lipoid disorders: Secondary | ICD-10-CM

## 2013-05-19 DIAGNOSIS — R5383 Other fatigue: Principal | ICD-10-CM

## 2013-05-19 LAB — CBC WITH DIFFERENTIAL/PLATELET
Basophils Absolute: 0 10*3/uL (ref 0.0–0.1)
Basophils Relative: 0.5 % (ref 0.0–3.0)
EOS PCT: 6.5 % — AB (ref 0.0–5.0)
Eosinophils Absolute: 0.4 10*3/uL (ref 0.0–0.7)
HEMATOCRIT: 42 % (ref 39.0–52.0)
HEMOGLOBIN: 14.2 g/dL (ref 13.0–17.0)
LYMPHS ABS: 1.6 10*3/uL (ref 0.7–4.0)
LYMPHS PCT: 28.4 % (ref 12.0–46.0)
MCHC: 33.8 g/dL (ref 30.0–36.0)
MCV: 87.4 fl (ref 78.0–100.0)
MONOS PCT: 8.3 % (ref 3.0–12.0)
Monocytes Absolute: 0.5 10*3/uL (ref 0.1–1.0)
Neutro Abs: 3.2 10*3/uL (ref 1.4–7.7)
Neutrophils Relative %: 56.3 % (ref 43.0–77.0)
PLATELETS: 183 10*3/uL (ref 150.0–400.0)
RBC: 4.81 Mil/uL (ref 4.22–5.81)
RDW: 13.2 % (ref 11.5–14.6)
WBC: 5.7 10*3/uL (ref 4.5–10.5)

## 2013-05-19 LAB — HEPATIC FUNCTION PANEL
ALT: 22 U/L (ref 0–53)
AST: 25 U/L (ref 0–37)
Albumin: 3.9 g/dL (ref 3.5–5.2)
Alkaline Phosphatase: 79 U/L (ref 39–117)
BILIRUBIN TOTAL: 0.7 mg/dL (ref 0.3–1.2)
Bilirubin, Direct: 0.1 mg/dL (ref 0.0–0.3)
Total Protein: 6.7 g/dL (ref 6.0–8.3)

## 2013-05-19 LAB — LIPID PANEL
CHOL/HDL RATIO: 6
CHOLESTEROL: 203 mg/dL — AB (ref 0–200)
HDL: 36.7 mg/dL — ABNORMAL LOW (ref >39.00)
LDL Cholesterol: 155 mg/dL — ABNORMAL HIGH (ref 0–99)
Triglycerides: 57 mg/dL (ref 0.0–149.0)
VLDL: 11.4 mg/dL (ref 0.0–40.0)

## 2013-05-19 LAB — PSA: PSA: 0.58 ng/mL (ref 0.10–4.00)

## 2013-05-19 LAB — BASIC METABOLIC PANEL
BUN: 26 mg/dL — ABNORMAL HIGH (ref 6–23)
CALCIUM: 9.3 mg/dL (ref 8.4–10.5)
CO2: 25 mEq/L (ref 19–32)
Chloride: 106 mEq/L (ref 96–112)
Creatinine, Ser: 1 mg/dL (ref 0.4–1.5)
GFR: 80.8 mL/min (ref >60.00)
GLUCOSE: 87 mg/dL (ref 70–99)
POTASSIUM: 4.1 meq/L (ref 3.5–5.1)
SODIUM: 140 meq/L (ref 135–145)

## 2014-09-12 ENCOUNTER — Other Ambulatory Visit: Payer: Self-pay | Admitting: Family Medicine

## 2014-09-12 NOTE — Telephone Encounter (Signed)
Last office visit 05/18/2013. No future appointments scheduled.  Last refilled 05/18/2013 60 g with 3 refills.  Ok to refill?

## 2014-10-29 ENCOUNTER — Other Ambulatory Visit: Payer: Self-pay | Admitting: Family Medicine

## 2014-10-29 NOTE — Telephone Encounter (Signed)
Ok to refill once only, cpx in the next few months

## 2014-10-29 NOTE — Telephone Encounter (Signed)
Please call and schedule CPE with fasting labs for Dr. Copland. 

## 2014-10-29 NOTE — Telephone Encounter (Signed)
Last office visit 05/18/2013.  No future appointments scheduled. Last refilled 09/13/2014 for 60 g with no refills.  Refill?

## 2014-12-13 ENCOUNTER — Other Ambulatory Visit (INDEPENDENT_AMBULATORY_CARE_PROVIDER_SITE_OTHER): Payer: Commercial Managed Care - HMO

## 2014-12-13 DIAGNOSIS — Z0000 Encounter for general adult medical examination without abnormal findings: Secondary | ICD-10-CM

## 2014-12-13 DIAGNOSIS — Z79899 Other long term (current) drug therapy: Secondary | ICD-10-CM

## 2014-12-13 DIAGNOSIS — Z125 Encounter for screening for malignant neoplasm of prostate: Secondary | ICD-10-CM | POA: Diagnosis not present

## 2014-12-13 DIAGNOSIS — Z13220 Encounter for screening for lipoid disorders: Secondary | ICD-10-CM | POA: Diagnosis not present

## 2014-12-13 LAB — BASIC METABOLIC PANEL
BUN: 25 mg/dL — AB (ref 6–23)
CO2: 28 mEq/L (ref 19–32)
Calcium: 9.5 mg/dL (ref 8.4–10.5)
Chloride: 107 mEq/L (ref 96–112)
Creatinine, Ser: 1.01 mg/dL (ref 0.40–1.50)
GFR: 83.04 mL/min (ref >60.00)
GLUCOSE: 87 mg/dL (ref 70–99)
Potassium: 4.3 mEq/L (ref 3.5–5.1)
Sodium: 141 mEq/L (ref 135–145)

## 2014-12-13 LAB — CBC WITH DIFFERENTIAL/PLATELET
BASOS ABS: 0 10*3/uL (ref 0.0–0.1)
Basophils Relative: 0.6 % (ref 0.0–3.0)
Eosinophils Absolute: 0.3 10*3/uL (ref 0.0–0.7)
Eosinophils Relative: 5.3 % — ABNORMAL HIGH (ref 0.0–5.0)
HCT: 43.7 % (ref 39.0–52.0)
HEMOGLOBIN: 14.6 g/dL (ref 13.0–17.0)
LYMPHS ABS: 1.6 10*3/uL (ref 0.7–4.0)
Lymphocytes Relative: 29.7 % (ref 12.0–46.0)
MCHC: 33.4 g/dL (ref 30.0–36.0)
MCV: 88.2 fl (ref 78.0–100.0)
MONO ABS: 0.5 10*3/uL (ref 0.1–1.0)
Monocytes Relative: 9.3 % (ref 3.0–12.0)
Neutro Abs: 2.9 10*3/uL (ref 1.4–7.7)
Neutrophils Relative %: 55.1 % (ref 43.0–77.0)
Platelets: 181 10*3/uL (ref 150.0–400.0)
RBC: 4.95 Mil/uL (ref 4.22–5.81)
RDW: 12.8 % (ref 11.5–15.5)
WBC: 5.2 10*3/uL (ref 4.0–10.5)

## 2014-12-13 LAB — HEPATIC FUNCTION PANEL
ALT: 20 U/L (ref 0–53)
AST: 22 U/L (ref 0–37)
Albumin: 4 g/dL (ref 3.5–5.2)
Alkaline Phosphatase: 76 U/L (ref 39–117)
BILIRUBIN TOTAL: 0.4 mg/dL (ref 0.2–1.2)
Bilirubin, Direct: 0.1 mg/dL (ref 0.0–0.3)
Total Protein: 6.6 g/dL (ref 6.0–8.3)

## 2014-12-13 LAB — LIPID PANEL
Cholesterol: 217 mg/dL — ABNORMAL HIGH (ref 0–200)
HDL: 37.1 mg/dL — ABNORMAL LOW (ref >39.00)
LDL Cholesterol: 163 mg/dL — ABNORMAL HIGH (ref 0–99)
NonHDL: 180.13
TRIGLYCERIDES: 88 mg/dL (ref 0.0–149.0)
Total CHOL/HDL Ratio: 6
VLDL: 17.6 mg/dL (ref 0.0–40.0)

## 2014-12-13 LAB — PSA: PSA: 0.56 ng/mL (ref 0.10–4.00)

## 2014-12-13 NOTE — Addendum Note (Signed)
Addended by: Alvina ChouWALSH, TERRI J on: 12/13/2014 02:53 PM   Modules accepted: Orders

## 2014-12-14 ENCOUNTER — Other Ambulatory Visit (INDEPENDENT_AMBULATORY_CARE_PROVIDER_SITE_OTHER): Payer: Commercial Managed Care - HMO

## 2014-12-14 DIAGNOSIS — Z0000 Encounter for general adult medical examination without abnormal findings: Secondary | ICD-10-CM

## 2014-12-14 LAB — URINALYSIS, ROUTINE W REFLEX MICROSCOPIC
BILIRUBIN URINE: NEGATIVE
HGB URINE DIPSTICK: NEGATIVE
Ketones, ur: NEGATIVE
Leukocytes, UA: NEGATIVE
NITRITE: NEGATIVE
RBC / HPF: NONE SEEN (ref 0–?)
Specific Gravity, Urine: >=1.03 — AB (ref 1.000–1.030)
Total Protein, Urine: NEGATIVE
UROBILINOGEN UA: 0.2 (ref 0.0–1.0)
Urine Glucose: NEGATIVE
pH: 5.5 (ref 5.0–8.0)

## 2014-12-14 LAB — HEPATITIS B SURFACE ANTIGEN: HEP B S AG: NEGATIVE

## 2014-12-14 LAB — HIV ANTIBODY (ROUTINE TESTING W REFLEX): HIV: NONREACTIVE

## 2014-12-22 ENCOUNTER — Encounter: Payer: Self-pay | Admitting: Family Medicine

## 2014-12-22 ENCOUNTER — Ambulatory Visit (INDEPENDENT_AMBULATORY_CARE_PROVIDER_SITE_OTHER): Payer: Commercial Managed Care - HMO | Admitting: Family Medicine

## 2014-12-22 VITALS — BP 116/86 | HR 78 | Temp 98.2°F | Ht 73.25 in | Wt 194.0 lb

## 2014-12-22 DIAGNOSIS — Z0000 Encounter for general adult medical examination without abnormal findings: Secondary | ICD-10-CM | POA: Diagnosis not present

## 2014-12-22 NOTE — Progress Notes (Signed)
Pre visit review using our clinic review tool, if applicable. No additional management support is needed unless otherwise documented below in the visit note. 

## 2014-12-22 NOTE — Progress Notes (Signed)
Dr. Frederico Hamman T. Jordany Russett, MD, [facility] [place], [address] Phone: [phone] Fax: [phone]  12/22/2014  Patient: Ian Golden, MRN: [id number], DOB: 04/11/1965, 49 y.o.  Primary Physician:  Owens Loffler, MD   Chief Complaint  Patient presents with  . Annual Exam   Subjective:   MIHRAN LEBARRON is a 49 y.o. pleasant patient who presents with the following:  Preventative Health Maintenance Visit:  Health Maintenance Summary Reviewed and updated, unless pt declines services.  Tobacco History Reviewed. Alcohol: No concerns, no excessive use Exercise Habits: Some activity, rec at least 30 mins 5 times a week STD concerns: no risk or activity to increase risk Drug Use: None Encouraged self-testicular check  Health Maintenance  Topic Date Due  . TETANUS/TDAP  10/02/1983  . INFLUENZA VACCINE  12/23/2015 (Originally 09/20/2014)  . COLONOSCOPY  12/23/2015 (Originally 10/02/2014)  . HIV Screening  Completed    There is no immunization history on file for this patient. Patient Active Problem List   Diagnosis Date Noted  . Hemorrhoids 05/19/2013  . MIGRAINE WITHOUT AURA 05/03/2010  . ALLERGIC RHINITIS 05/26/2009  . GASTRIC ULCER, HX OF 05/26/2009  . SHINGLES, HX OF 05/26/2009   Past Medical History  Diagnosis Date  . Allergic rhinitis, cause unspecified   . Ulcer   . Migraine   . Hemorrhoids    No past surgical history on file. Social History   Social History  . Marital Status: Single    Spouse Name: N/A  . Number of Children: 2  . Years of Education: N/A   Occupational History  .  [employer] Enforcement   Social History Main Topics  . Smoking status: Never Smoker   . Smokeless tobacco: Never Used  . Alcohol Use: No  . Drug Use: No  . Sexual Activity:    Partners: Female   Other Topics Concern  . 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 No Known Allergies  Medication list has been reviewed and updated.   General: Denies fever, chills, sweats. No significant weight loss. Eyes: Denies blurring,significant itching ENT: Denies earache, sore throat, and hoarseness. Cardiovascular: Denies chest pains, palpitations, dyspnea on exertion Respiratory: Denies cough, dyspnea at rest,wheeezing Breast: no concerns about lumps GI: Denies nausea, vomiting, diarrhea, constipation, change in bowel habits, abdominal pain, melena, hematochezia GU: Denies penile discharge, ED, urinary flow / outflow problems. No STD concerns. Musculoskeletal: Denies back pain, joint pain Derm: Denies rash, itching Neuro: Denies  paresthesias, frequent falls, frequent headaches Psych: Denies depression, anxiety Endocrine: Denies cold intolerance, heat intolerance, polydipsia Heme: Denies enlarged lymph nodes Allergy: No hayfever  Objective:   BP 116/86 mmHg  Pulse 78  Temp(Src) 98.2 F (36.8 C) (Oral)  Ht 6' 1.25" (1.861 m)  Wt 194 lb (87.998 kg)  BMI 25.41 kg/m2 Ideal Body Weight: Weight in (lb) to have BMI = 25: 190.4  No exam data present  GEN: well developed, well nourished, no acute distress Eyes: conjunctiva and lids normal, PERRLA, EOMI ENT: TM clear, nares clear, oral exam WNL Neck: supple, no lymphadenopathy, no thyromegaly, no JVD Pulm: clear to auscultation and percussion, respiratory effort normal CV: regular rate and rhythm, S1-S2, no murmur, rub or gallop, no bruits, peripheral pulses normal and symmetric, no cyanosis, clubbing, edema or varicosities  GI: soft, non-tender; no hepatosplenomegaly, masses; active bowel sounds all quadrants GU: no hernia, testicular mass, penile discharge Lymph: no cervical, axillary or inguinal adenopathy MSK: gait normal, muscle tone and strength WNL, no joint swelling, effusions, discoloration, crepitus  SKIN: clear, good  turgor, color WNL, no rashes, lesions, or ulcerations Neuro: normal mental status, normal strength, sensation, and motion Psych: alert; oriented to person, place and time, normally interactive and not anxious or depressed in appearance. All labs reviewed with patient.  Lipids:    Component Value Date/Time   CHOL 217* 12/13/2014 0842   TRIG 88.0 12/13/2014 0842   HDL 37.10* 12/13/2014 0842   LDLDIRECT 166.2 04/23/2011 0904   VLDL 17.6 12/13/2014 0842   CHOLHDL 6 12/13/2014 0842   CBC: CBC Latest Ref Rng 12/13/2014 05/19/2013 04/16/2011  WBC 4.0 - 10.5 K/uL 5.2 5.7 5.1  Hemoglobin 13.0 - 17.0 g/dL 14.6 14.2 14.5  Hematocrit 39.0 - 52.0 % 43.7 42.0 42.8  Platelets 150.0 - 400.0 K/uL 181.0 183.0 102.5    Basic Metabolic Panel:    Component Value Date/Time   NA 141 12/13/2014 0842   K 4.3 12/13/2014 0842   CL 107 12/13/2014 0842   CO2 28 12/13/2014 0842   BUN 25* 12/13/2014 0842   CREATININE 1.01 12/13/2014 0842   GLUCOSE 87 12/13/2014 0842   CALCIUM 9.5 12/13/2014 0842   Hepatic Function Latest Ref Rng 12/13/2014 05/19/2013  Total Protein 6.0 - 8.3 g/dL 6.6 6.7  Albumin 3.5 - 5.2 g/dL 4.0 3.9  AST 0 - 37 U/L 22 25  ALT 0 - 53 U/L 20 22  Alk Phosphatase 39 - 117 U/L 76 79  Total Bilirubin 0.2 - 1.2 mg/dL 0.4 0.7  Bilirubin, Direct 0.0 - 0.3 mg/dL 0.1 0.1    No results found for: TSH Lab Results  Component Value Date   PSA 0.56 12/13/2014   PSA 0.58 05/19/2013   PSA 0.58 04/23/2011    Assessment and Plan:   Routine general medical examination at a health care facility  Health Maintenance Exam: The patient's preventative maintenance and recommended screening tests for an annual wellness exam were reviewed in full today. Brought up to date unless services declined.  Counselled on the importance of diet, exercise, and its role in overall health and mortality. The patient's FH and SH was reviewed, including their home life, tobacco status, and drug and alcohol  status.  Adoption paperwork completed.  Follow-up: No Follow-up on file. Unless noted, follow-up in 1 year for Health Maintenance Exam.  New Prescriptions   No medications on file   Modified Medications   No medications on file   No orders of the defined types were placed in this encounter.    Signed,  Maud Deed. Lakara Weiland, MD   Patient's Medications  New Prescriptions   No medications on file  Previous Medications   FLUOCINONIDE OINTMENT (LIDEX) 0.05 %    APPLY TO AFFECTED AREA TWICE A DAY   LIDOCAINE-HYDROCORTISONE (ANAMANTEL HC) 3-0.5 % CREA    Place 1 Applicatorful rectally 2 (two) times daily.   SUMATRIPTAN SUCCINATE REFILL 6 MG/0.5ML SOLN    Inject 1 Syringe into the skin as needed. Inject one syringe subcutaneously at onset of headache. May repeat in 2 hours if needed as directedInject one syringe subcutaneously at onset of headache. May repeat in 2 hours if needed as directed  Modified Medications   No medications on file  Discontinued Medications   No medications on file

## 2015-01-19 ENCOUNTER — Encounter: Payer: Self-pay | Admitting: Internal Medicine

## 2015-03-14 ENCOUNTER — Ambulatory Visit (AMBULATORY_SURGERY_CENTER): Payer: Self-pay | Admitting: *Deleted

## 2015-03-14 VITALS — Ht 73.0 in | Wt 200.0 lb

## 2015-03-14 DIAGNOSIS — Z1211 Encounter for screening for malignant neoplasm of colon: Secondary | ICD-10-CM

## 2015-03-14 NOTE — Progress Notes (Signed)
No egg or soy allergy known to patient  No issues with past sedation with any surgeries  or procedures, no intubation problems  No diet pills per patient No home 02 use per patient  No blood thinners   emmi video to  [email or web address]

## 2015-03-28 ENCOUNTER — Ambulatory Visit (AMBULATORY_SURGERY_CENTER): Payer: Commercial Managed Care - HMO | Admitting: Internal Medicine

## 2015-03-28 ENCOUNTER — Encounter: Payer: Self-pay | Admitting: Internal Medicine

## 2015-03-28 VITALS — BP 98/63 | HR 63 | Temp 97.1°F | Resp 13 | Ht 73.25 in | Wt 194.0 lb

## 2015-03-28 DIAGNOSIS — K5000 Crohn's disease of small intestine without complications: Secondary | ICD-10-CM | POA: Diagnosis not present

## 2015-03-28 DIAGNOSIS — K633 Ulcer of intestine: Secondary | ICD-10-CM | POA: Diagnosis not present

## 2015-03-28 DIAGNOSIS — Z1211 Encounter for screening for malignant neoplasm of colon: Secondary | ICD-10-CM | POA: Diagnosis present

## 2015-03-28 MED ORDER — SODIUM CHLORIDE 0.9 % IV SOLN: 500.0000 mL | INTRAVENOUS | Status: DC

## 2015-03-28 NOTE — Progress Notes (Signed)
Report to PACU, RN, vss, BBS= Clear.  

## 2015-03-28 NOTE — Progress Notes (Signed)
Called to room to assist during endoscopic procedure.  Patient ID and intended procedure confirmed with present staff. Received instructions for my participation in the procedure from the performing physician.  

## 2015-03-28 NOTE — Op Note (Signed)
[facility] [address] [place], [address]   COLONOSCOPY PROCEDURE REPORT  PATIENT: Ian Golden, Ian Golden  MR#: [id number] BIRTHDATE: July 23, 1965 , 49  yrs. old GENDER: male ENDOSCOPIST: Iva Boop, MD, [facility] PROCEDURE DATE:  03/28/2015 PROCEDURE:   Colonoscopy, screening and Colonoscopy with biopsy First Screening Colonoscopy - Avg.  risk and is 50 yrs.  old or older Yes.  Prior Negative Screening - Now for repeat screening. N/A  History of Adenoma - Now for follow-up colonoscopy & has been > or = to 3 yrs.  N/A  Polyps removed today? No Recommend repeat exam, <10 yrs? No ASA CLASS:   Class II INDICATIONS:Screening for colonic neoplasia and Colorectal Neoplasm Risk Assessment for this procedure is average risk. MEDICATIONS: Propofol 230 mg IV and Monitored anesthesia care  DESCRIPTION OF PROCEDURE:   After the risks benefits and alternatives of the procedure were thoroughly explained, informed consent was obtained.  The digital rectal exam revealed no abnormalities of the rectum, revealed the prostate was not enlarged, and revealed no prostatic nodules.   The [id number]  endoscope was introduced through the anus and advanced to the terminal ileum which was intubated for a short distance. No adverse events experienced.   The quality of the prep was good. (MiraLax was used)  The instrument was then slowly withdrawn as the colon was fully examined. Estimated blood loss is zero unless otherwise noted in this procedure report.    COLON FINDINGS: Multiple small non-bleeding and shallow ulcers were found at the ileocecal valve and in the terminal ileum.  Biopsies were taken at the center of the ulcers and at edge of the ulcers. The examination was otherwise normal.  Retroflexed views revealed no abnormalities. The time to cecum = 3.7 Withdrawal time = 9.5 The scope was withdrawn and the procedure completed. COMPLICATIONS: There were no immediate  complications.  ENDOSCOPIC IMPRESSION: 1.   Multiple small ulcers were found at the ileocecal valve and in the terminal ileum; biopsies were taken 2.   The examination was otherwise normal  RECOMMENDATIONS: 1.  Await pathology results - ? occult IBD, NSAID's are NOt on med list but could  be that or could be prep effect 2.  Repeat colonoscopy 10 years.  eSigned:  Iva Boop, MD, [facility] 03/28/2015 11:05 AM   cc: Dr. Hannah Beat  and The Patient

## 2015-03-28 NOTE — Patient Instructions (Addendum)
I did not find any polyps but there were some ulcers at the end of the small intestine - i took biopsies and will let you know results.  Next routine colonoscopy in 10 years - 2027  I appreciate the opportunity to care for you. Iva Boop, MD, FACG   YOU HAD AN ENDOSCOPIC PROCEDURE TODAY AT THE [facility]:   Refer to the procedure report that was given to you for any specific questions about what was found during the examination.  If the procedure report does not answer your questions, please call your gastroenterologist to clarify.  If you requested that your care partner not be given the details of your procedure findings, then the procedure report has been included in a sealed envelope for you to review at your convenience later.  YOU SHOULD EXPECT: Some feelings of bloating in the abdomen. Passage of more gas than usual.  Walking can help get rid of the air that was put into your GI tract during the procedure and reduce the bloating. If you had a lower endoscopy (such as a colonoscopy or flexible sigmoidoscopy) you may notice spotting of blood in your stool or on the toilet paper. If you underwent a bowel prep for your procedure, you may not have a normal bowel movement for a few days.  Please Note:  You might notice some irritation and congestion in your nose or some drainage.  This is from the oxygen used during your procedure.  There is no need for concern and it should clear up in a day or so.  SYMPTOMS TO REPORT IMMEDIATELY:   Following lower endoscopy (colonoscopy or flexible sigmoidoscopy):  Excessive amounts of blood in the stool  Significant tenderness or worsening of abdominal pains  Swelling of the abdomen that is new, acute  Fever of 100F or higher   For urgent or emergent issues, a gastroenterologist can be reached at any hour by calling [phone].   DIET: Your first meal following the procedure should be a small meal and then it is ok to  progress to your normal diet. Heavy or fried foods are harder to digest and may make you feel nauseous or bloated.  Likewise, meals heavy in dairy and vegetables can increase bloating.  Drink plenty of fluids but you should avoid alcoholic beverages for 24 hours.  ACTIVITY:  You should plan to take it easy for the rest of today and you should NOT DRIVE or use heavy machinery until tomorrow (because of the sedation medicines used during the test).    FOLLOW UP: Our staff will call the number listed on your records the next business day following your procedure to check on you and address any questions or concerns that you may have regarding the information given to you following your procedure. If we do not reach you, we will leave a message.  However, if you are feeling well and you are not experiencing any problems, there is no need to return our call.  We will assume that you have returned to your regular daily activities without incident.  If any biopsies were taken you will be contacted by phone or by letter within the next 1-3 weeks.  Please call us at [phone] if you have not heard about the biopsies in 3 weeks.    SIGNATURES/CONFIDENTIALITY: You and/or your care partner have signed paperwork which will be entered into your electronic medical record.  These signatures attest to the fact that that the  information above on your After Visit Summary has been reviewed and is understood.  Full responsibility of the confidentiality of this discharge information lies with you and/or your care-partner.

## 2015-03-29 ENCOUNTER — Telehealth: Payer: Self-pay

## 2015-03-29 NOTE — Telephone Encounter (Signed)
  Follow up Call-  Call back number 03/28/2015  Post procedure Call Back phone  # [phone]  Permission to leave phone message Yes     Patient questions:  Do you have a fever, pain , or abdominal swelling? No. Pain Score  0 *  Have you tolerated food without any problems? Yes.    Have you been able to return to your normal activities? Yes.    Do you have any questions about your discharge instructions: Diet   No. Medications  No. Follow up visit  No.  Do you have questions or concerns about your Care? No.  Actions: * If pain score is 4 or above: No action needed, pain <4.

## 2015-04-01 ENCOUNTER — Encounter: Payer: Self-pay | Admitting: Internal Medicine

## 2015-04-01 NOTE — Progress Notes (Signed)
Quick Note:  Ileal ulcers = ileitis - cause unclear Will observe OV 1 year ______

## 2015-06-27 ENCOUNTER — Encounter: Payer: Self-pay | Admitting: Family Medicine

## 2015-06-27 ENCOUNTER — Ambulatory Visit (INDEPENDENT_AMBULATORY_CARE_PROVIDER_SITE_OTHER): Payer: Commercial Managed Care - HMO | Admitting: Family Medicine

## 2015-06-27 VITALS — BP 104/68 | HR 75 | Temp 98.3°F | Ht 73.25 in | Wt 200.8 lb

## 2015-06-27 DIAGNOSIS — M7661 Achilles tendinitis, right leg: Secondary | ICD-10-CM | POA: Diagnosis not present

## 2015-06-27 MED ORDER — SUMATRIPTAN SUCCINATE REFILL 6 MG/0.5ML ~~LOC~~ SOCT: 1.0000 | SUBCUTANEOUS | Status: DC | PRN

## 2015-06-27 MED ORDER — NITROGLYCERIN 0.3 MG/HR TD PT24: MEDICATED_PATCH | TRANSDERMAL | Status: DC

## 2015-06-27 NOTE — Progress Notes (Signed)
Pre visit review using our clinic review tool, if applicable. No additional management support is needed unless otherwise documented below in the visit note. 

## 2015-06-27 NOTE — Progress Notes (Signed)
Dr. Karleen HampshireSpencer T. Rooney Gladwin, MD, CAQ Sports Medicine [facility] [address] [place], [id number] Phone: [phone] Fax: [phone]  06/27/2015  Patient: Ian Golden, MRN: [id number], DOB: 1965/06/05, 50 y.o.  Primary Physician:  Hannah BeatSpencer Lis Savitt, MD   Chief Complaint  Patient presents with  . Foot Pain    Right Heel x 2 months   Subjective:   Ian Golden is a 50 y.o. very pleasant male patient who presents with the following:  2 month h/o R sided achilles insertional pain. He has a history of B AT about 5 years ago, treated well with conservative measures. He was playing basketball and had pain afterwards. He is still able to plantarflex and has not had any bruising.  Past Medical History, Surgical History, Social History, Family History, Problem List, Medications, and Allergies have been reviewed and updated if relevant.  Patient Active Problem List   Diagnosis Date Noted  . Hemorrhoids 05/19/2013  . MIGRAINE WITHOUT AURA 05/03/2010  . ALLERGIC RHINITIS 05/26/2009  . GASTRIC ULCER, HX OF 05/26/2009  . SHINGLES, HX OF 05/26/2009    Past Medical History  Diagnosis Date  . Allergic rhinitis, cause unspecified   . Ulcer   . Migraine   . Hemorrhoids   . Allergy     Past Surgical History  Procedure Laterality Date  . Wisdom tooth extraction      with sedation    Social History   Social History  . Marital Status: Single    Spouse Name: N/A  . Number of Children: 2  . Years of Education: N/A   [place]up[place]  [employer]    [employer]   Social History Main Topics  . Smoking status: Never Smoker   . Smokeless tobacco: Never Used  . Alcohol Use: No  . Drug Use: No  . Sexual Activity:    Partners: Female   Other Topics Concern  . 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 Colon  polyps Father   . Colon cancer Neg Hx   . Esophageal cancer Neg Hx   . Rectal cancer Neg Hx   . Stomach cancer Neg Hx     No Known Allergies  Medication list reviewed and updated in full in [facility].  GEN: No fevers, chills. Nontoxic. Primarily MSK c/o today. MSK: Detailed in the HPI GI: tolerating PO intake without difficulty Neuro: No numbness, parasthesias, or tingling associated. Otherwise the pertinent positives of the ROS are noted above.   Objective:   BP 104/68 mmHg  Pulse 75  Temp(Src) 98.3 F (36.8 C) (Oral)  Ht 6' 1.25" (1.861 m)  Wt 200 lb 12 oz (91.06 kg)  BMI 26.29 kg/m2   GEN: Well-developed,well-nourished,in no acute distress; alert,appropriate and cooperative throughout examination HEENT: Normocephalic and atraumatic without obvious abnormalities. Ears, externally no deformities PULM: Breathing comfortably in no respiratory distress EXT: No clubbing, cyanosis, or edema PSYCH: Normally interactive. Cooperative during the interview. Pleasant. Friendly and conversant. Not anxious or depressed appearing. Normal, full affect.  Foot: R Echymosis: no Edema: no ROM: full LE B Gait: heel toe, non-antalgic MT pain: no Callus pattern: none Lateral Mall: NT Medial Mall: NT Talus: NT Navicular: NT Cuboid: NT Calcaneous: NT Metatarsals: NT 5th MT: NT Phalanges: NT Achilles: PAINFUL TO PALPATE  AT INSERTION ON R, SMALL NODULE - more laterally  Plantar Fascia: NT Fat Pad: NT Peroneals: NT Post Tib: NT Great Toe: Nml motion Ant Drawer: neg ATFL: NT CFL: NT Deltoid: NT Sensation: intact   Radiology: No results found.  Assessment and Plan:   Achilles tendinitis of right lower extremity  Based on history and exam, expect that he had a small tear or insertional injury at the outer aspect of achilles. Rest x 4 weeks. Crafted heel lift out of orthopedic felt.   In approx 4 weeks or when feeling better, start eccentric program.  Follow-up: if  having difficulties in 4-6 weeks  New Prescriptions   NITROGLYCERIN (NITRODUR - DOSED IN MG/24 HR) 0.3 MG/HR PATCH    Apply 1/4 of a patch to the affected area and change every 24 hours (re: tendinopathy)   Modified Medications   Modified Medication Previous Medication   SUMATRIPTAN SUCCINATE REFILL 6 MG/0.5ML SOCT SUMAtriptan Succinate Refill 6 MG/0.5ML SOLN      Inject 1 Syringe into the skin as needed. Inject one syringe subcutaneously at onset of headache. May repeat in 2 hours prn x 1    Inject 1 Syringe into the skin as needed. Inject one syringe subcutaneously at onset of headache. May repeat in 2 hours if needed as directedInject one syringe subcutaneously at onset of headache. May repeat in 2 hours if needed as directed   No orders of the defined types were placed in this encounter.    Signed,  Elpidio Galea. Glayds Insco, MD   Patient's Medications  New Prescriptions   NITROGLYCERIN (NITRODUR - DOSED IN MG/24 HR) 0.3 MG/HR PATCH    Apply 1/4 of a patch to the affected area and change every 24 hours (re: tendinopathy)  Previous Medications   FLUOCINONIDE OINTMENT (LIDEX) 0.05 %    APPLY TO AFFECTED AREA TWICE A DAY   LIDOCAINE-HYDROCORTISONE (ANAMANTEL HC) 3-0.5 % CREA    Place 1 Applicatorful rectally 2 (two) times daily.  Modified Medications   Modified Medication Previous Medication   SUMATRIPTAN SUCCINATE REFILL 6 MG/0.5ML SOCT SUMAtriptan Succinate Refill 6 MG/0.5ML SOLN      Inject 1 Syringe into the skin as needed. Inject one syringe subcutaneously at onset of headache. May repeat in 2 hours prn x 1    Inject 1 Syringe into the skin as needed. Inject one syringe subcutaneously at onset of headache. May repeat in 2 hours if needed as directedInject one syringe subcutaneously at onset of headache. May repeat in 2 hours if needed as directed  Discontinued Medications   No medications on file

## 2016-07-24 DIAGNOSIS — M722 Plantar fascial fibromatosis: Secondary | ICD-10-CM | POA: Diagnosis not present

## 2016-07-25 ENCOUNTER — Ambulatory Visit: Payer: Commercial Managed Care - HMO | Admitting: Family Medicine

## 2016-09-11 ENCOUNTER — Other Ambulatory Visit: Payer: Self-pay | Admitting: Nurse Practitioner

## 2016-09-11 ENCOUNTER — Ambulatory Visit
Admission: RE | Admit: 2016-09-11 | Discharge: 2016-09-11 | Disposition: A | Discharge status: Home / Self Care | Payer: Worker's Compensation | Source: Ambulatory Visit | Attending: Nurse Practitioner | Admitting: Nurse Practitioner

## 2016-09-11 DIAGNOSIS — M79672 Pain in left foot: Secondary | ICD-10-CM

## 2016-09-24 ENCOUNTER — Other Ambulatory Visit: Payer: Self-pay | Admitting: Family Medicine

## 2016-09-24 MED ORDER — FLUOCINONIDE 0.05 % EX OINT: TOPICAL_OINTMENT | Freq: Two times a day (BID) | CUTANEOUS | 0 refills | Status: DC

## 2016-09-24 NOTE — Telephone Encounter (Signed)
Last office visit 06/27/2015.  CPE scheduled for 10/28/2016.  Last refilled 10/29/2014 for 60 g with no refills.  Ok to refill?

## 2016-10-12 ENCOUNTER — Ambulatory Visit (INDEPENDENT_AMBULATORY_CARE_PROVIDER_SITE_OTHER): Payer: 59

## 2016-10-12 ENCOUNTER — Ambulatory Visit (INDEPENDENT_AMBULATORY_CARE_PROVIDER_SITE_OTHER): Payer: 59 | Admitting: Podiatry

## 2016-10-12 ENCOUNTER — Encounter: Payer: Self-pay | Admitting: Podiatry

## 2016-10-12 VITALS — BP 150/93 | HR 78 | Resp 16

## 2016-10-12 DIAGNOSIS — M722 Plantar fascial fibromatosis: Secondary | ICD-10-CM

## 2016-10-12 DIAGNOSIS — M7661 Achilles tendinitis, right leg: Secondary | ICD-10-CM

## 2016-10-12 MED ORDER — TRIAMCINOLONE ACETONIDE 10 MG/ML IJ SUSP
10.0000 mg | Freq: Once | INTRAMUSCULAR | Status: AC
  Administered 2016-10-12: 10 mg

## 2016-10-12 NOTE — Progress Notes (Signed)
   Subjective:    Patient ID: Ian Golden, male    DOB: [birth year], 51 y.o.   MRN: [id number]  HPI Chief Complaint  Patient presents with  . Foot Pain    Bilateral; Left foot-bottom of heel; Right foot; back of heel; Since March 2018; pt stated, "Thinks this is work related, I am a Emergency planning/management officer; worker's compensation was denied by the [employer]"      Review of Systems  HENT: Positive for sinus pain.   Musculoskeletal: Positive for arthralgias and gait problem.  All other systems reviewed and are negative.      Objective:   Physical Exam        Assessment & Plan:

## 2016-10-12 NOTE — Patient Instructions (Signed)
Plantar Fasciitis (Heel Spur Syndrome) with Rehab The plantar fascia is a fibrous, ligament-like, soft-tissue structure that spans the bottom of the foot. Plantar fasciitis is a condition that causes pain in the foot due to inflammation of the tissue. SYMPTOMS   Pain and tenderness on the underneath side of the foot.  Pain that worsens with standing or walking. CAUSES  Plantar fasciitis is caused by irritation and injury to the plantar fascia on the underneath side of the foot. Common mechanisms of injury include:  Direct trauma to bottom of the foot.  Damage to a small nerve that runs under the foot where the main fascia attaches to the heel bone.  Stress placed on the plantar fascia due to bone spurs. RISK INCREASES WITH:   Activities that place stress on the plantar fascia (running, jumping, pivoting, or cutting).  Poor strength and flexibility.  Improperly fitted shoes.  Tight calf muscles.  Flat feet.  Failure to warm-up properly before activity.  Obesity. PREVENTION  Warm up and stretch properly before activity.  Allow for adequate recovery between workouts.  Maintain physical fitness:  Strength, flexibility, and endurance.  Cardiovascular fitness.  Maintain a health body weight.  Avoid stress on the plantar fascia.  Wear properly fitted shoes, including arch supports for individuals who have flat feet.  PROGNOSIS  If treated properly, then the symptoms of plantar fasciitis usually resolve without surgery. However, occasionally surgery is necessary.  RELATED COMPLICATIONS   Recurrent symptoms that may result in a chronic condition.  Problems of the lower back that are caused by compensating for the injury, such as limping.  Pain or weakness of the foot during push-off following surgery.  Chronic inflammation, scarring, and partial or complete fascia tear, occurring more often from repeated injections.  TREATMENT  Treatment initially involves the  use of ice and medication to help reduce pain and inflammation. The use of strengthening and stretching exercises may help reduce pain with activity, especially stretches of the Achilles tendon. These exercises may be performed at home or with a therapist. Your caregiver may recommend that you use heel cups of arch supports to help reduce stress on the plantar fascia. Occasionally, corticosteroid injections are given to reduce inflammation. If symptoms persist for greater than 6 months despite non-surgical (conservative), then surgery may be recommended.   MEDICATION   If pain medication is necessary, then nonsteroidal anti-inflammatory medications, such as aspirin and ibuprofen, or other minor pain relievers, such as acetaminophen, are often recommended.  Do not take pain medication within 7 days before surgery.  Prescription pain relievers may be given if deemed necessary by your caregiver. Use only as directed and only as much as you need.  Corticosteroid injections may be given by your caregiver. These injections should be reserved for the most serious cases, because they may only be given a certain number of times.  HEAT AND COLD  Cold treatment (icing) relieves pain and reduces inflammation. Cold treatment should be applied for 10 to 15 minutes every 2 to 3 hours for inflammation and pain and immediately after any activity that aggravates your symptoms. Use ice packs or massage the area with a piece of ice (ice massage).  Heat treatment may be used prior to performing the stretching and strengthening activities prescribed by your caregiver, physical therapist, or athletic trainer. Use a heat pack or soak the injury in warm water.  SEEK IMMEDIATE MEDICAL CARE IF:  Treatment seems to offer no benefit, or the condition worsens.  Any medications   produce adverse side effects.  EXERCISES- RANGE OF MOTION (ROM) AND STRETCHING EXERCISES - Plantar Fasciitis (Heel Spur Syndrome) These exercises  may help you when beginning to rehabilitate your injury. Your symptoms may resolve with or without further involvement from your physician, physical therapist or athletic trainer. While completing these exercises, remember:   Restoring tissue flexibility helps normal motion to return to the joints. This allows healthier, less painful movement and activity.  An effective stretch should be held for at least 30 seconds.  A stretch should never be painful. You should only feel a gentle lengthening or release in the stretched tissue.  RANGE OF MOTION - Toe Extension, Flexion  Sit with your right / left leg crossed over your opposite knee.  Grasp your toes and gently pull them back toward the top of your foot. You should feel a stretch on the bottom of your toes and/or foot.  Hold this stretch for 10 seconds.  Now, gently pull your toes toward the bottom of your foot. You should feel a stretch on the top of your toes and or foot.  Hold this stretch for 10 seconds. Repeat  times. Complete this stretch 3 times per day.   RANGE OF MOTION - Ankle Dorsiflexion, Active Assisted  Remove shoes and sit on a chair that is preferably not on a carpeted surface.  Place right / left foot under knee. Extend your opposite leg for support.  Keeping your heel down, slide your right / left foot back toward the chair until you feel a stretch at your ankle or calf. If you do not feel a stretch, slide your bottom forward to the edge of the chair, while still keeping your heel down.  Hold this stretch for 10 seconds. Repeat 3 times. Complete this stretch 2 times per day.   STRETCH  Gastroc, Standing  Place hands on wall.  Extend right / left leg, keeping the front knee somewhat bent.  Slightly point your toes inward on your back foot.  Keeping your right / left heel on the floor and your knee straight, shift your weight toward the wall, not allowing your back to arch.  You should feel a gentle stretch  in the right / left calf. Hold this position for 10 seconds. Repeat 3 times. Complete this stretch 2 times per day.  STRETCH  Soleus, Standing  Place hands on wall.  Extend right / left leg, keeping the other knee somewhat bent.  Slightly point your toes inward on your back foot.  Keep your right / left heel on the floor, bend your back knee, and slightly shift your weight over the back leg so that you feel a gentle stretch deep in your back calf.  Hold this position for 10 seconds. Repeat 3 times. Complete this stretch 2 times per day.  STRETCH  Gastrocsoleus, Standing  Note: This exercise can place a lot of stress on your foot and ankle. Please complete this exercise only if specifically instructed by your caregiver.   Place the ball of your right / left foot on a step, keeping your other foot firmly on the same step.  Hold on to the wall or a rail for balance.  Slowly lift your other foot, allowing your body weight to press your heel down over the edge of the step.  You should feel a stretch in your right / left calf.  Hold this position for 10 seconds.  Repeat this exercise with a slight bend in your right /   left knee. Repeat 3 times. Complete this stretch 2 times per day.   STRENGTHENING EXERCISES - Plantar Fasciitis (Heel Spur Syndrome)  These exercises may help you when beginning to rehabilitate your injury. They may resolve your symptoms with or without further involvement from your physician, physical therapist or athletic trainer. While completing these exercises, remember:   Muscles can gain both the endurance and the strength needed for everyday activities through controlled exercises.  Complete these exercises as instructed by your physician, physical therapist or athletic trainer. Progress the resistance and repetitions only as guided.  STRENGTH - Towel Curls  Sit in a chair positioned on a non-carpeted surface.  Place your foot on a towel, keeping your heel  on the floor.  Pull the towel toward your heel by only curling your toes. Keep your heel on the floor. Repeat 3 times. Complete this exercise 2 times per day.  STRENGTH - Ankle Inversion  Secure one end of a rubber exercise band/tubing to a fixed object (table, pole). Loop the other end around your foot just before your toes.  Place your fists between your knees. This will focus your strengthening at your ankle.  Slowly, pull your big toe up and in, making sure the band/tubing is positioned to resist the entire motion.  Hold this position for 10 seconds.  Have your muscles resist the band/tubing as it slowly pulls your foot back to the starting position. Repeat 3 times. Complete this exercises 2 times per day.  Document Released: 02/05/2005 Document Revised: 04/30/2011 Document Reviewed: 05/20/2008 [employer] Patient Information 2014 [employer]. Achilles Tendinitis  with Rehab Achilles tendinitis is a disorder of the Achilles tendon. The Achilles tendon connects the large calf muscles (Gastrocnemius and Soleus) to the heel bone (calcaneus). This tendon is sometimes called the heel cord. It is important for pushing-off and standing on your toes and is important for walking, running, or jumping. Tendinitis is often caused by overuse and repetitive microtrauma. SYMPTOMS  Pain, tenderness, swelling, warmth, and redness may occur over the Achilles tendon even at rest.  Pain with pushing off, or flexing or extending the ankle.  Pain that is worsened after or during activity. CAUSES   Overuse sometimes seen with rapid increase in exercise programs or in sports requiring running and jumping.  Poor physical conditioning (strength and flexibility or endurance).  Running sports, especially training running down hills.  Inadequate warm-up before practice or play or failure to stretch before participation.  Injury to the tendon. PREVENTION   Warm up and stretch before practice or  competition.  Allow time for adequate rest and recovery between practices and competition.  Keep up conditioning.  Keep up ankle and leg flexibility.  Improve or keep muscle strength and endurance.  Improve cardiovascular fitness.  Use proper technique.  Use proper equipment (shoes, skates).  To help prevent recurrence, taping, protective strapping, or an adhesive bandage may be recommended for several weeks after healing is complete. PROGNOSIS   Recovery may take weeks to several months to heal.  Longer recovery is expected if symptoms have been prolonged.  Recovery is usually quicker if the inflammation is due to a direct blow as compared with overuse or sudden strain. RELATED COMPLICATIONS   Healing time will be prolonged if the condition is not correctly treated. The injury must be given plenty of time to heal.  Symptoms can reoccur if activity is resumed too soon.  Untreated, tendinitis may increase the risk of tendon rupture requiring additional time for recovery   and possibly surgery. TREATMENT   The first treatment consists of rest anti-inflammatory medication, and ice to relieve the pain.  Stretching and strengthening exercises after resolution of pain will likely help reduce the risk of recurrence. Referral to a physical therapist or athletic trainer for further evaluation and treatment may be helpful.  A walking boot or cast may be recommended to rest the Achilles tendon. This can help break the cycle of inflammation and microtrauma.  Arch supports (orthotics) may be prescribed or recommended by your caregiver as an adjunct to therapy and rest.  Surgery to remove the inflamed tendon lining or degenerated tendon tissue is rarely necessary and has shown less than predictable results. MEDICATION   Nonsteroidal anti-inflammatory medications, such as aspirin and ibuprofen, may be used for pain and inflammation relief. Do not take within 7 days before surgery. Take  these as directed by your caregiver. Contact your caregiver immediately if any bleeding, stomach upset, or signs of allergic reaction occur. Other minor pain relievers, such as acetaminophen, may also be used.  Pain relievers may be prescribed as necessary by your caregiver. Do not take prescription pain medication for longer than 4 to 7 days. Use only as directed and only as much as you need.  Cortisone injections are rarely indicated. Cortisone injections may weaken tendons and predispose to rupture. It is better to give the condition more time to heal than to use them. HEAT AND COLD  Cold is used to relieve pain and reduce inflammation for acute and chronic Achilles tendinitis. Cold should be applied for 10 to 15 minutes every 2 to 3 hours for inflammation and pain and immediately after any activity that aggravates your symptoms. Use ice packs or an ice massage.  Heat may be used before performing stretching and strengthening activities prescribed by your caregiver. Use a heat pack or a warm soak. SEEK MEDICAL CARE IF:  Symptoms get worse or do not improve in 2 weeks despite treatment.  New, unexplained symptoms develop. Drugs used in treatment may produce side effects.  EXERCISES:  RANGE OF MOTION (ROM) AND STRETCHING EXERCISES - Achilles Tendinitis  These exercises may help you when beginning to rehabilitate your injury. Your symptoms may resolve with or without further involvement from your physician, physical therapist or athletic trainer. While completing these exercises, remember:   Restoring tissue flexibility helps normal motion to return to the joints. This allows healthier, less painful movement and activity.  An effective stretch should be held for at least 30 seconds.  A stretch should never be painful. You should only feel a gentle lengthening or release in the stretched tissue.  STRETCH  Gastroc, Standing   Place hands on wall.  Extend right / left leg, keeping the  front knee somewhat bent.  Slightly point your toes inward on your back foot.  Keeping your right / left heel on the floor and your knee straight, shift your weight toward the wall, not allowing your back to arch.  You should feel a gentle stretch in the right / left calf. Hold this position for 10 seconds. Repeat 3 times. Complete this stretch 2 times per day.  STRETCH  Soleus, Standing   Place hands on wall.  Extend right / left leg, keeping the other knee somewhat bent.  Slightly point your toes inward on your back foot.  Keep your right / left heel on the floor, bend your back knee, and slightly shift your weight over the back leg so that you feel a   gentle stretch deep in your back calf.  Hold this position for 10 seconds. Repeat 3 times. Complete this stretch 2 times per day.  STRETCH  Gastrocsoleus, Standing  Note: This exercise can place a lot of stress on your foot and ankle. Please complete this exercise only if specifically instructed by your caregiver.   Place the ball of your right / left foot on a step, keeping your other foot firmly on the same step.  Hold on to the wall or a rail for balance.  Slowly lift your other foot, allowing your body weight to press your heel down over the edge of the step.  You should feel a stretch in your right / left calf.  Hold this position for 10 seconds.  Repeat this exercise with a slight bend in your knee. Repeat 3 times. Complete this stretch 2 times per day.   STRENGTHENING EXERCISES - Achilles Tendinitis These exercises may help you when beginning to rehabilitate your injury. They may resolve your symptoms with or without further involvement from your physician, physical therapist or athletic trainer. While completing these exercises, remember:   Muscles can gain both the endurance and the strength needed for everyday activities through controlled exercises.  Complete these exercises as instructed by your physician,  physical therapist or athletic trainer. Progress the resistance and repetitions only as guided.  You may experience muscle soreness or fatigue, but the pain or discomfort you are trying to eliminate should never worsen during these exercises. If this pain does worsen, stop and make certain you are following the directions exactly. If the pain is still present after adjustments, discontinue the exercise until you can discuss the trouble with your clinician.  STRENGTH - Plantar-flexors   Sit with your right / left leg extended. Holding onto both ends of a rubber exercise band/tubing, loop it around the ball of your foot. Keep a slight tension in the band.  Slowly push your toes away from you, pointing them downward.  Hold this position for 10 seconds. Return slowly, controlling the tension in the band/tubing. Repeat 3 times. Complete this exercise 2 times per day.   STRENGTH - Plantar-flexors   Stand with your feet shoulder width apart. Steady yourself with a wall or table using as little support as needed.  Keeping your weight evenly spread over the width of your feet, rise up on your toes.*  Hold this position for 10 seconds. Repeat 3 times. Complete this exercise 2 times per day.  *If this is too easy, shift your weight toward your right / left leg until you feel challenged. Ultimately, you may be asked to do this exercise with your right / left foot only.  STRENGTH  Plantar-flexors, Eccentric  Note: This exercise can place a lot of stress on your foot and ankle. Please complete this exercise only if specifically instructed by your caregiver.   Place the balls of your feet on a step. With your hands, use only enough support from a wall or rail to keep your balance.  Keep your knees straight and rise up on your toes.  Slowly shift your weight entirely to your right / left toes and pick up your opposite foot. Gently and with controlled movement, lower your weight through your right /  left foot so that your heel drops below the level of the step. You will feel a slight stretch in the back of your calf at the end position.  Use the healthy leg to help rise up onto   the balls of both feet, then lower weight only on the right / left leg again. Build up to 15 repetitions. Then progress to 3 consecutive sets of 15 repetitions.*  After completing the above exercise, complete the same exercise with a slight knee bend (about 30 degrees). Again, build up to 15 repetitions. Then progress to 3 consecutive sets of 15 repetitions.* Perform this exercise 2 times per day.  *When you easily complete 3 sets of 15, your physician, physical therapist or athletic trainer may advise you to add resistance by wearing a backpack filled with additional weight.  STRENGTH - Plantar Flexors, Seated   Sit on a chair that allows your feet to rest flat on the ground. If necessary, sit at the edge of the chair.  Keeping your toes firmly on the ground, lift your right / left heel as far as you can without increasing any discomfort in your ankle. Repeat 3 times. Complete this exercise 2 times a day.  

## 2016-10-12 NOTE — Progress Notes (Signed)
Subjective:    Patient ID: Ian Golden, male   DOB: 51 y.o.   MRN: [id number]   HPI patient presents with acute plantar fascial pain left 4 months duration and on the right is noted to have pain in the outside of the right Achilles tendon that's tender when pressed and making walking difficult. 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       Objective:  Physical Exam  Constitutional: He appears well-developed and well-nourished.  Cardiovascular: Intact distal pulses.   Pulmonary/Chest: Breath sounds normal.  Musculoskeletal: Normal range of motion.  Neurological: He is alert.  Skin: Skin is warm.  Nursing note and vitals reviewed.  neurovascular status intact muscle strength adequate range of motion within normal limits with patient noted to have exquisite discomfort plantar aspect left heel at the insertional point tendon into the calcaneus and moderate discomfort in the posterior lateral aspect of the right Achilles at the insertion into the calcaneus. Patient has moderate depression of the arch is found to have good digital perfusion well oriented 3     Assessment:   Acute plantar fasciitis left with Achilles tendinitis right lateral side      Plan:    H&P conditions reviewed and at this time for the left I went ahead and injected the plantar fascia 3 mg Kenalog 5 mg Xylocaine and I did discuss injection of the right explaining the chances for rupture associated with injection and he wants to take risk and I injected the lateral side of the Achilles right 3 mg dexamethasone Kenalog 5 mg Xylocaine and advised on reduced activity. I then applied fascial brace left and patient be seen back to recheck again in the next several weeks  X-rays reviewed of on the right for posterior heel spur formation with no indication of stress fracture

## 2016-10-23 ENCOUNTER — Other Ambulatory Visit: Payer: Self-pay | Admitting: Family Medicine

## 2016-10-23 DIAGNOSIS — Z125 Encounter for screening for malignant neoplasm of prostate: Secondary | ICD-10-CM

## 2016-10-23 DIAGNOSIS — Z0000 Encounter for general adult medical examination without abnormal findings: Secondary | ICD-10-CM

## 2016-10-25 ENCOUNTER — Ambulatory Visit: Payer: 59 | Admitting: Podiatry

## 2016-10-29 ENCOUNTER — Other Ambulatory Visit: Payer: Self-pay

## 2016-11-05 ENCOUNTER — Encounter: Payer: Self-pay | Admitting: Family Medicine

## 2016-11-07 ENCOUNTER — Encounter: Payer: Self-pay | Admitting: Family Medicine

## 2016-11-14 ENCOUNTER — Other Ambulatory Visit (INDEPENDENT_AMBULATORY_CARE_PROVIDER_SITE_OTHER): Payer: 59

## 2016-11-14 DIAGNOSIS — Z125 Encounter for screening for malignant neoplasm of prostate: Secondary | ICD-10-CM | POA: Diagnosis not present

## 2016-11-14 DIAGNOSIS — Z0000 Encounter for general adult medical examination without abnormal findings: Secondary | ICD-10-CM | POA: Diagnosis not present

## 2016-11-14 LAB — CBC WITH DIFFERENTIAL/PLATELET
Basophils Absolute: 0 10*3/uL (ref 0.0–0.1)
Basophils Relative: 0.7 % (ref 0.0–3.0)
EOS PCT: 7.9 % — AB (ref 0.0–5.0)
Eosinophils Absolute: 0.4 10*3/uL (ref 0.0–0.7)
HCT: 42.8 % (ref 39.0–52.0)
HEMOGLOBIN: 14 g/dL (ref 13.0–17.0)
Lymphocytes Relative: 26.3 % (ref 12.0–46.0)
Lymphs Abs: 1.3 10*3/uL (ref 0.7–4.0)
MCHC: 32.8 g/dL (ref 30.0–36.0)
MCV: 85.4 fl (ref 78.0–100.0)
MONO ABS: 0.4 10*3/uL (ref 0.1–1.0)
Monocytes Relative: 8.5 % (ref 3.0–12.0)
Neutro Abs: 2.9 10*3/uL (ref 1.4–7.7)
Neutrophils Relative %: 56.6 % (ref 43.0–77.0)
Platelets: 192 10*3/uL (ref 150.0–400.0)
RBC: 5.01 Mil/uL (ref 4.22–5.81)
RDW: 14 % (ref 11.5–15.5)
WBC: 5.1 10*3/uL (ref 4.0–10.5)

## 2016-11-14 LAB — BASIC METABOLIC PANEL
BUN: 23 mg/dL (ref 6–23)
CO2: 30 mEq/L (ref 19–32)
Calcium: 9.4 mg/dL (ref 8.4–10.5)
Chloride: 106 mEq/L (ref 96–112)
Creatinine, Ser: 1.06 mg/dL (ref 0.40–1.50)
GFR: 77.94 mL/min (ref >60.00)
Glucose, Bld: 99 mg/dL (ref 70–99)
POTASSIUM: 4.8 meq/L (ref 3.5–5.1)
SODIUM: 140 meq/L (ref 135–145)

## 2016-11-14 LAB — LIPID PANEL
CHOLESTEROL: 230 mg/dL — AB (ref 0–200)
HDL: 42.9 mg/dL (ref >39.00)
LDL CALC: 172 mg/dL — AB (ref 0–99)
NonHDL: 187.52
TRIGLYCERIDES: 79 mg/dL (ref 0.0–149.0)
Total CHOL/HDL Ratio: 5
VLDL: 15.8 mg/dL (ref 0.0–40.0)

## 2016-11-14 LAB — HEPATIC FUNCTION PANEL
ALBUMIN: 4.1 g/dL (ref 3.5–5.2)
ALT: 24 U/L (ref 0–53)
AST: 22 U/L (ref 0–37)
Alkaline Phosphatase: 78 U/L (ref 39–117)
BILIRUBIN DIRECT: 0 mg/dL (ref 0.0–0.3)
TOTAL PROTEIN: 6.6 g/dL (ref 6.0–8.3)
Total Bilirubin: 0.3 mg/dL (ref 0.2–1.2)

## 2016-11-14 LAB — PSA: PSA: 0.64 ng/mL (ref 0.10–4.00)

## 2016-11-21 ENCOUNTER — Encounter: Payer: Self-pay | Admitting: Family Medicine

## 2016-12-17 ENCOUNTER — Encounter: Payer: Self-pay | Admitting: Family Medicine

## 2016-12-17 ENCOUNTER — Ambulatory Visit (INDEPENDENT_AMBULATORY_CARE_PROVIDER_SITE_OTHER): Payer: 59 | Admitting: Family Medicine

## 2016-12-17 VITALS — BP 130/82 | HR 81 | Temp 98.3°F | Ht 73.25 in | Wt 202.8 lb

## 2016-12-17 DIAGNOSIS — Z0000 Encounter for general adult medical examination without abnormal findings: Secondary | ICD-10-CM | POA: Diagnosis not present

## 2016-12-17 MED ORDER — SUMATRIPTAN SUCCINATE REFILL 6 MG/0.5ML ~~LOC~~ SOCT: 1.0000 | SUBCUTANEOUS | 5 refills | Status: DC | PRN

## 2016-12-17 NOTE — Progress Notes (Signed)
Dr. Frederico Hamman T. Shakeeta Godette, MD, [facility] [place], [address] Phone: [phone] Fax: [phone]  12/17/2016  Patient: Ian Golden, MRN: [id number], DOB: September 08, 1965, 51 y.o.  Primary Physician:  Owens Loffler, MD   Chief Complaint  Patient presents with  . Annual Exam   Subjective:   Ian Golden is a 51 y.o. pleasant patient who presents with the following:  Preventative Health Maintenance Visit:  Health Maintenance Summary Reviewed and updated, unless pt declines services.  Tobacco History Reviewed. Alcohol: No concerns, no excessive use Exercise Habits: Some activity, rec at least 30 mins 5 times a week STD concerns: no risk or activity to increase risk Drug Use: None Encouraged self-testicular check  Has a L bone spur - heel Achilles tendinopathy  The 10-year ASCVD risk score Mikey Bussing DC Jr., et al., 2013) is: 6%   Values used to calculate the score:     Age: 16 years     Sex: Male     Is Non-Hispanic African American: No     Diabetic: No     Tobacco smoker: No     Systolic Blood Pressure: 008 mmHg     Is BP treated: No     HDL Cholesterol: 42.9 mg/dL     Total Cholesterol: 230 mg/dL  Health Maintenance  Topic Date Due  . INFLUENZA VACCINE  05/19/2017 (Originally 09/19/2016)  . TETANUS/TDAP  05/19/2017 (Originally 10/02/1983)  . COLONOSCOPY  03/27/2025  . HIV Screening  Completed    There is no immunization history on file for this patient. Patient Active Problem List   Diagnosis Date Noted  . Hemorrhoids 05/19/2013  . MIGRAINE WITHOUT AURA 05/03/2010  . ALLERGIC RHINITIS 05/26/2009  . GASTRIC ULCER, HX OF 05/26/2009  . SHINGLES, HX OF 05/26/2009   Past Medical History:  Diagnosis Date  . Allergic rhinitis, cause unspecified   . Allergy   . Hemorrhoids   . Migraine   . Ulcer    Past Surgical History:  Procedure Laterality Date  . WISDOM TOOTH EXTRACTION     with sedation    Social History   Social History  . Marital status: Single    Spouse name: N/A  . Number of children: 2  . Years of education: N/A   Occupational History  .  [employer]   Social History Main Topics  . Smoking status: Never Smoker  . Smokeless tobacco: Never Used  . Alcohol use No  . Drug use: No  . Sexual activity: Yes    Partners: Female   Other Topics Concern  . Not on file   Social History Narrative   Married, 2 adopted children   Curator   Regular exercise- no   Family History  Problem Relation Age of Onset  . Colon polyps Father   . Colon cancer Neg Hx   . Esophageal cancer Neg Hx   . Rectal cancer Neg Hx   . Stomach cancer Neg Hx    No Known Allergies  Medication list has been reviewed and updated.   General: Denies fever, chills, sweats. No significant weight loss. Eyes: Denies blurring,significant itching ENT: Denies earache, sore throat, and hoarseness. Cardiovascular: Denies chest pains, palpitations, dyspnea on exertion Respiratory: Denies cough, dyspnea at rest,wheeezing Breast: no concerns about lumps GI: Denies nausea, vomiting, diarrhea, constipation, change in bowel habits, abdominal pain, melena, hematochezia GU: Denies penile discharge, ED, urinary flow /  outflow problems. No STD concerns. Musculoskeletal: Denies back pain, joint pain Derm: Denies rash, itching Neuro: Denies  paresthesias, frequent falls, frequent headaches Psych: Denies depression, anxiety Endocrine: Denies cold intolerance, heat intolerance, polydipsia Heme: Denies enlarged lymph nodes Allergy: No hayfever  Objective:   BP 130/82   Pulse 81   Temp 98.3 F (36.8 C) (Oral)   Ht 6' 1.25" (1.861 m)   Wt 202 lb 12 oz (92 kg)   BMI 26.57 kg/m  Ideal Body Weight: Weight in (lb) to have BMI = 25: 190.4  No exam data present  GEN: well developed, well nourished, no acute distress Eyes: conjunctiva and lids normal, PERRLA,  EOMI ENT: TM clear, nares clear, oral exam WNL Neck: supple, no lymphadenopathy, no thyromegaly, no JVD Pulm: clear to auscultation and percussion, respiratory effort normal CV: regular rate and rhythm, S1-S2, no murmur, rub or gallop, no bruits, peripheral pulses normal and symmetric, no cyanosis, clubbing, edema or varicosities GI: soft, non-tender; no hepatosplenomegaly, masses; active bowel sounds all quadrants GU: no hernia, testicular mass, penile discharge Lymph: no cervical, axillary or inguinal adenopathy MSK: gait normal, muscle tone and strength WNL, no joint swelling, effusions, discoloration, crepitus  SKIN: clear, good turgor, color WNL, no rashes, lesions, or ulcerations Neuro: normal mental status, normal strength, sensation, and motion Psych: alert; oriented to person, place and time, normally interactive and not anxious or depressed in appearance. All labs reviewed with patient.  Lipids:    Component Value Date/Time   CHOL 230 (H) 11/14/2016 0916   TRIG 79.0 11/14/2016 0916   HDL 42.90 11/14/2016 0916   LDLDIRECT 166.2 04/23/2011 0904   VLDL 15.8 11/14/2016 0916   CHOLHDL 5 11/14/2016 0916   CBC: CBC Latest Ref Rng & Units 11/14/2016 12/13/2014 05/19/2013  WBC 4.0 - 10.5 K/uL 5.1 5.2 5.7  Hemoglobin 13.0 - 17.0 g/dL 14.0 14.6 14.2  Hematocrit 39.0 - 52.0 % 42.8 43.7 42.0  Platelets 150.0 - 400.0 K/uL 192.0 181.0 956.3    Basic Metabolic Panel:    Component Value Date/Time   NA 140 11/14/2016 0916   K 4.8 11/14/2016 0916   CL 106 11/14/2016 0916   CO2 30 11/14/2016 0916   BUN 23 11/14/2016 0916   CREATININE 1.06 11/14/2016 0916   GLUCOSE 99 11/14/2016 0916   CALCIUM 9.4 11/14/2016 0916   Hepatic Function Latest Ref Rng & Units 11/14/2016 12/13/2014 05/19/2013  Total Protein 6.0 - 8.3 g/dL 6.6 6.6 6.7  Albumin 3.5 - 5.2 g/dL 4.1 4.0 3.9  AST 0 - 37 U/L '22 22 25  '$ ALT 0 - 53 U/L '24 20 22  '$ Alk Phosphatase 39 - 117 U/L 78 76 79  Total Bilirubin 0.2 - 1.2  mg/dL 0.3 0.4 0.7  Bilirubin, Direct 0.0 - 0.3 mg/dL 0.0 0.1 0.1    No results found for: TSH Lab Results  Component Value Date   PSA 0.64 11/14/2016   PSA 0.56 12/13/2014   PSA 0.58 05/19/2013    Assessment and Plan:   Routine general medical examination at a health care facility   Health Maintenance Exam: The patient's preventative maintenance and recommended screening tests for an annual wellness exam were reviewed in full today. Brought up to date unless services declined.  Counselled on the importance of diet, exercise, and its role in overall health and mortality. The patient's FH and SH was reviewed, including their home life, tobacco status, and drug and alcohol status.  Follow-up in 1 year for physical exam or additional  follow-up below.  Follow-up: No Follow-up on file. Or follow-up in 1 year if not noted.  Meds ordered this encounter  Medications  . SUMAtriptan Succinate Refill 6 MG/0.5ML SOCT    Sig: Inject 1 Syringe into the skin as needed. Inject one syringe subcutaneously at onset of headache. May repeat in 2 hours prn x 1    Dispense:  5 Syringe    Refill:  5   Medications Discontinued During This Encounter  Medication Reason  . SUMAtriptan Succinate Refill 6 MG/0.5ML SOCT Reorder   Signed,  Vidhi Delellis T. Chanon Loney, MD   Allergies as of 12/17/2016   No Known Allergies     Medication List       Accurate as of 12/17/16 11:12 AM. Always use your most recent med list.          fluocinonide ointment 0.05 % Commonly known as:  LIDEX Apply topically 2 (two) times daily.   SUMAtriptan Succinate Refill 6 MG/0.5ML Soct Inject 1 Syringe into the skin as needed. Inject one syringe subcutaneously at onset of headache. May repeat in 2 hours prn x 1

## 2017-01-02 DIAGNOSIS — Z23 Encounter for immunization: Secondary | ICD-10-CM | POA: Diagnosis not present

## 2017-01-24 ENCOUNTER — Telehealth: Payer: Self-pay

## 2017-01-24 NOTE — Telephone Encounter (Signed)
Pt had CPX on 12/17/16.Please advise.

## 2017-01-24 NOTE — Telephone Encounter (Signed)
Copied from CRM [phone]. Topic: Inquiry >> Jan 24, 2017  2:45 PM Yvonna Alanisobinson, Andra M wrote: Reason for CRM: Patient called requesting for Dr. Patsy Lageropland to order a special lab for patient to be tested for Prothrombin Gene Mutation. Patient stated that his sister and brother have been diagnosed with it, and he wants to make sure if he has it or not. Patient stated that he saw Dr. Patsy Lageropland recently.

## 2017-01-29 ENCOUNTER — Other Ambulatory Visit: Payer: Self-pay | Admitting: Family Medicine

## 2017-01-29 DIAGNOSIS — Z832 Family history of diseases of the blood and blood-forming organs and certain disorders involving the immune mechanism: Secondary | ICD-10-CM

## 2017-01-29 NOTE — Telephone Encounter (Signed)
done

## 2017-01-29 NOTE — Progress Notes (Unsigned)
I set as a future order - can we schedule for lab draw at his convenience

## 2017-01-30 NOTE — Progress Notes (Signed)
Spoke with Mrs. Ian Golden and ask her to have Mr. Ian Golden call the office and schedule lab appointment.  Orders are in Epic.

## 2017-02-05 DIAGNOSIS — H524 Presbyopia: Secondary | ICD-10-CM | POA: Diagnosis not present

## 2017-02-05 DIAGNOSIS — H5203 Hypermetropia, bilateral: Secondary | ICD-10-CM | POA: Diagnosis not present

## 2017-02-07 ENCOUNTER — Other Ambulatory Visit: Payer: Self-pay

## 2017-02-07 ENCOUNTER — Other Ambulatory Visit (INDEPENDENT_AMBULATORY_CARE_PROVIDER_SITE_OTHER): Payer: 59

## 2017-02-07 DIAGNOSIS — Z832 Family history of diseases of the blood and blood-forming organs and certain disorders involving the immune mechanism: Secondary | ICD-10-CM | POA: Diagnosis not present

## 2017-02-11 LAB — PROTHROMBIN GENE MUTATION

## 2017-05-03 ENCOUNTER — Ambulatory Visit (INDEPENDENT_AMBULATORY_CARE_PROVIDER_SITE_OTHER): Payer: 59

## 2017-05-03 ENCOUNTER — Ambulatory Visit: Payer: 59 | Admitting: Podiatry

## 2017-05-03 ENCOUNTER — Encounter: Payer: Self-pay | Admitting: Podiatry

## 2017-05-03 DIAGNOSIS — M7661 Achilles tendinitis, right leg: Secondary | ICD-10-CM | POA: Diagnosis not present

## 2017-05-03 DIAGNOSIS — M7662 Achilles tendinitis, left leg: Secondary | ICD-10-CM

## 2017-05-03 DIAGNOSIS — M722 Plantar fascial fibromatosis: Secondary | ICD-10-CM

## 2017-05-03 MED ORDER — TRIAMCINOLONE ACETONIDE 10 MG/ML IJ SUSP
10.0000 mg | Freq: Once | INTRAMUSCULAR | Status: AC
  Administered 2017-05-03: 10 mg

## 2017-05-03 NOTE — Progress Notes (Signed)
Subjective:   Patient ID: Ian GottronHenry W Golden, male   DOB: 52 y.o.   MRN: [id number]   HPI Patient states he did real well for 4 months but over the last few months he started to develop increased discomfort in his right back of the heel and the left bottom of the heel.  States that he knew he should have come back earlier but he has not followed through as we had asked   ROS      Objective:  Physical Exam  Neurovascular status intact with acute inflammation posterior lateral aspect right Achilles at its insertion calcaneus with discomfort in the plantar heel left at the insertional point of the tendon into the calcaneus.     Assessment:  Acute Achilles tendinitis right lateral side and plantar fasciitis left     Plan:  H&P discussed both conditions and discussed careful injection right explaining chances for rupture.  Patient wants procedure understanding risk and I did a careful injection lateral side of the posterior insertion Achilles 3 mg Dexasone Kenalog 5 mm Xylocaine being careful not to put it into the central medial portion of the tendon and also he will begin wearing cast boot that he has at home.  I injected the left plantar fascia 3 mg Kenalog pyelogram Xylocaine and scanned for custom orthotic devices to reduce plantar pressure on his feet with quarter inch heel lifts.  Reappoint when ready or earlier if any issues should occur.  X-rays indicate minimal spur formation posterior heel with mild plantar spur formation bilateral

## 2017-05-05 ENCOUNTER — Emergency Department (HOSPITAL_COMMUNITY): Payer: 59

## 2017-05-05 ENCOUNTER — Encounter (HOSPITAL_COMMUNITY): Payer: Self-pay

## 2017-05-05 ENCOUNTER — Emergency Department (HOSPITAL_COMMUNITY)
Admission: EM | Admit: 2017-05-05 | Discharge: 2017-05-05 | Disposition: A | Discharge status: Home / Self Care | Payer: 59 | Attending: Physician Assistant | Admitting: Physician Assistant

## 2017-05-05 DIAGNOSIS — M5126 Other intervertebral disc displacement, lumbar region: Secondary | ICD-10-CM | POA: Insufficient documentation

## 2017-05-05 DIAGNOSIS — R200 Anesthesia of skin: Secondary | ICD-10-CM | POA: Diagnosis not present

## 2017-05-05 DIAGNOSIS — M546 Pain in thoracic spine: Secondary | ICD-10-CM | POA: Diagnosis not present

## 2017-05-05 DIAGNOSIS — M545 Low back pain: Secondary | ICD-10-CM | POA: Diagnosis not present

## 2017-05-05 MED ORDER — BUPIVACAINE HCL (PF) 0.5 % IJ SOLN
20.0000 mL | Freq: Once | INTRAMUSCULAR | Status: DC
  Filled 2017-05-05: qty 20

## 2017-05-05 MED ORDER — LIDOCAINE 5 % EX PTCH: 1.0000 | MEDICATED_PATCH | CUTANEOUS | 0 refills | Status: DC

## 2017-05-05 MED ORDER — METHOCARBAMOL 500 MG PO TABS
500.0000 mg | ORAL_TABLET | Freq: Once | ORAL | Status: AC
Start: 2017-05-05 — End: 2017-05-05
  Administered 2017-05-05: 500 mg via ORAL
  Filled 2017-05-05: qty 1

## 2017-05-05 MED ORDER — BUPIVACAINE HCL 0.5 % IJ SOLN: 20.0000 mL | Freq: Once | INTRAMUSCULAR | Status: DC

## 2017-05-05 MED ORDER — METHOCARBAMOL 500 MG PO TABS: 500.0000 mg | ORAL_TABLET | Freq: Every evening | ORAL | 0 refills | Status: DC | PRN

## 2017-05-05 MED ORDER — FENTANYL CITRATE (PF) 100 MCG/2ML IJ SOLN
50.0000 ug | Freq: Once | INTRAMUSCULAR | Status: AC
  Administered 2017-05-05: 50 ug via INTRAVENOUS
  Filled 2017-05-05: qty 2

## 2017-05-05 MED ORDER — DEXAMETHASONE SODIUM PHOSPHATE 10 MG/ML IJ SOLN
10.0000 mg | Freq: Once | INTRAMUSCULAR | Status: AC
  Administered 2017-05-05: 10 mg via INTRAVENOUS
  Filled 2017-05-05: qty 1

## 2017-05-05 MED ORDER — METHYLPREDNISOLONE SODIUM SUCC 125 MG IJ SOLR: 125.0000 mg | Freq: Once | INTRAMUSCULAR | Status: DC

## 2017-05-05 MED ORDER — KETOROLAC TROMETHAMINE 30 MG/ML IJ SOLN: 15.0000 mg | Freq: Once | INTRAMUSCULAR | Status: DC

## 2017-05-05 MED ORDER — HYDROCODONE-ACETAMINOPHEN 5-325 MG PO TABS: 1.0000 | ORAL_TABLET | ORAL | 0 refills | Status: DC | PRN

## 2017-05-05 NOTE — ED Notes (Signed)
Pt and family verbalized understanding of discharge paperwork.

## 2017-05-05 NOTE — Discharge Instructions (Signed)
Take Naproxen twice daily for pain for the next 5 days Use a Lidocaine patch over painful areas Use Robaxin and Norco for severe pain. This medicine can make you sleepy Follow up with orthopedics

## 2017-05-05 NOTE — ED Notes (Signed)
ED Provider at bedside. 

## 2017-05-05 NOTE — ED Provider Notes (Signed)
[facility] EMERGENCY DEPARTMENT Provider Note   CSN: [id number] Arrival date & time: 05/05/17  4098     History   Chief Complaint Chief Complaint  Patient presents with  . Back Pain    HPI Ian Golden is a 52 y.o. male who presents with back pain. 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 Over the day the pain progressively worsened and became severe. It started radiating down the left thigh to the knee. He reports associated paresthesias of the left thigh. He has had pain in this area but it's never been this severe. Usually will improve with heat and OTC meds. He tried a Norco without significant relief of his pain. He called EMS this morning who gave him Fentanyl which helped but the pain is returning. No fever, syncope, trauma, unexplained weight loss, hx of cancer, loss of bowel/bladder function, saddle anesthesia, urinary retention, IVDU. He has an appointment with orthopedics tomorrow.  HPI  Past Medical History:  Diagnosis Date  . Allergic rhinitis, cause unspecified   . Allergy   . Hemorrhoids   . Migraine   . Ulcer     Patient Active Problem List   Diagnosis Date Noted  . Hemorrhoids 05/19/2013  . MIGRAINE WITHOUT AURA 05/03/2010  . ALLERGIC RHINITIS 05/26/2009  . GASTRIC ULCER, HX OF 05/26/2009  . SHINGLES, HX OF 05/26/2009    Past Surgical History:  Procedure Laterality Date  . WISDOM TOOTH EXTRACTION     with sedation     Home Medications    Prior to Admission medications   Medication Sig Start Date End Date Taking? Authorizing Provider  B-D TB SYRINGE 1CC/25GX5/8" 25G X 5/8" 1 ML MISC  04/22/17   [provider]  fluocinonide ointment (LIDEX) 0.05 % Apply topically 2 (two) times daily. 09/24/16   Copland, Karleen Hampshire, MD  SUMAtriptan Succinate Refill 6 MG/0.5ML SOCT  Inject 1 Syringe into the skin as needed. Inject one syringe subcutaneously at onset of headache. May repeat in 2 hours prn x 1 12/17/16   Copland, Karleen Hampshire, MD    Family History Family History  Problem Relation Age of Onset  . Colon polyps Father   . Colon cancer Neg Hx   . Esophageal cancer Neg Hx   . Rectal cancer Neg Hx   . Stomach cancer Neg Hx     Social History Social History   Tobacco Use  . Smoking status: Never Smoker  . Smokeless tobacco: Never Used  Substance Use Topics  . Alcohol use: No    Alcohol/week: 0.0 oz  . Drug use: No     Allergies   Patient has no known allergies.   Review of Systems Review of Systems  Constitutional: Negative for fever.  Musculoskeletal: Positive for back pain and myalgias.  Neurological: Positive for numbness. Negative for weakness.  All other systems reviewed and are negative.    Physical Exam Updated Vital Signs BP 130/68 (BP Location: Right Arm)   Pulse 65   Temp 98.6 F (37 C) (Oral)   Resp 20   SpO2 98%   Physical Exam  Constitutional: He is oriented to person, place, and time. He appears well-developed and well-nourished. No distress.  Lying flat on the stretcher. Uncomfortable with movement.  HENT:  Head: Normocephalic and atraumatic.  Eyes: Conjunctivae are  normal. Pupils are equal, round, and reactive to light. Right eye exhibits no discharge. Left eye exhibits no discharge. No scleral icterus.  Neck: Normal range of motion.  Cardiovascular: Normal rate.  Pulmonary/Chest: Effort normal. No respiratory distress.  Abdominal: Soft. Bowel sounds are normal. He exhibits no distension. There is no tenderness.  Musculoskeletal:  Back: Inspection: No masses, deformity, or rash Palpation: No midline spinal tenderness. Left lumbar paraspinal muscle tenderness. ROM: Deferred. He is able to roll on his side independently Strength: 5/5 in right lower extremity and 4/5 in left lower extremity Sensation: Subjective  decreased sensation over the left thigh Gait: Deferred   Neurological: He is alert and oriented to person, place, and time.  Skin: Skin is warm and dry.  Psychiatric: He has a normal mood and affect. His behavior is normal.  Nursing note and vitals reviewed.    ED Treatments / Results  Labs (all labs ordered are listed, but only abnormal results are displayed) Labs Reviewed - No data to display  EKG  EKG Interpretation None       Radiology Dg Lumbar Spine Complete  Result Date: 05/05/2017 CLINICAL DATA:  [facility] pain that started yesterday after lifting objects while working in yard. States that the pain is radiating down left arm. Alert and oriented, received fentanyl 150 by EMS, States pain now 7/10 EXAM: LUMBAR SPINE - COMPLETE 4+ VIEW COMPARISON:  None FINDINGS: Normal alignment of lumbar vertebral bodies. No loss of vertebral body height or disc height. No pars fracture. No subluxation. IMPRESSION: No acute findings lumbar spine. Electronically Signed   By: Genevive BiStewart  Edmunds M.D.   On: 05/05/2017 08:56   Mr Lumbar Spine Wo Contrast  Result Date: 05/05/2017 CLINICAL DATA:  52 year old male with lumbar back pain after heavy lifting yesterday. Left side radiculopathy. EXAM: MRI LUMBAR SPINE WITHOUT CONTRAST TECHNIQUE: Multiplanar, multisequence MR imaging of the lumbar spine was performed. No intravenous contrast was administered. COMPARISON:  Lumbar radiographs 0835 hours today. FINDINGS: Segmentation:  Normal on today's radiographs. Alignment: Increased straightening of lumbar lordosis compared to the earlier radiographs. No spondylolisthesis. Vertebrae: Visualized bone marrow signal is within normal limits. No marrow edema or evidence of acute osseous abnormality. Intact visible sacrum and SI joints. Conus medullaris and cauda equina: Conus extends to the L1-L2 level. Conus and cauda equina appear normal. Paraspinal and other soft tissues: Partially  visible mild urinary bladder distention (series 4, image 10). Visualized abdominal viscera and paraspinal soft tissues are within normal limits. Disc levels: T11-T12: Negative. T12-L1:  Negative. L1-L2:  Negative. L2-L3: Subtle left foraminal/extraforaminal disc protrusion (series 7, image 12). This abuts the exiting left L2 nerve which appears mildly swollen. Otherwise negative. L3-L4:  Mild bilateral facet hypertrophy.  Otherwise negative. L4-L5: Disc desiccation. Mild circumferential disc bulge. Small central annular fissure (series 7, image 26). Mild bilateral facet hypertrophy. No spinal or lateral recess stenosis. Borderline to mild bilateral L4 foraminal stenosis. L5-S1:  Negative. IMPRESSION: 1. Symptomatic abnormality with regard to left side radiculopathy is favored to be a subtle left foraminal disc herniation at L2-L3. Query left L2 radiculitis. 2. Mild disc degeneration at L4-L5 with annular fissure but no associated disc herniation or neural impingement. 3. Mild facet degeneration at L3-L4 and L4-L5. No acute osseous abnormality. Electronically Signed   By: Odessa FlemingH  Hall M.D.   On: 05/05/2017 15:10    Procedures Trigger point injection Date/Time: 05/05/2017 1:00 PM Performed by: Bethel BornGekas, Pattye Meda Marie, PA-C Authorized by: Bethel BornGekas, Ashyla Luth Marie,  PA-C  Consent: Verbal consent obtained. Risks and benefits: risks, benefits and alternatives were discussed Consent given by: patient Patient understanding: patient states understanding of the procedure being performed Patient consent: the patient's understanding of the procedure matches consent given Test results: test results available and properly labeled Site marked: the operative site was marked Imaging studies: imaging studies available Patient identity confirmed: verbally with patient Time out: Immediately prior to procedure a "time out" was called to verify the correct patient, procedure, equipment, support staff and site/side marked as  required. Preparation: Patient was prepped and draped in the usual sterile fashion. Local anesthesia used: yes  Anesthesia: Local anesthesia used: yes Local Anesthetic: bupivacaine 0.5% without epinephrine Anesthetic total: 3 mL  Sedation: Patient sedated: no  Patient tolerance: Patient tolerated the procedure well with no immediate complications  Injection tendon or ligament Date/Time: 05/05/2017 2:00 PM Performed by: Bethel Born, PA-C Authorized by: Bethel Born, PA-C  Consent: Verbal consent obtained. Written consent obtained. Consent given by: patient Patient understanding: patient states understanding of the procedure being performed Patient consent: the patient's understanding of the procedure matches consent given Site marked: the operative site was marked Imaging studies: imaging studies available Patient identity confirmed: verbally with patient Time out: Immediately prior to procedure a "time out" was called to verify the correct patient, procedure, equipment, support staff and site/side marked as required. Preparation: Patient was prepped and draped in the usual sterile fashion. Local anesthesia used: yes  Anesthesia: Local anesthesia used: yes Local Anesthetic: bupivacaine 0.5% without epinephrine Anesthetic total: 3 mL  Sedation: Patient sedated: no  Patient tolerance: Patient tolerated the procedure well with no immediate complications      (including critical care time)    Medications Ordered in ED Medications  bupivacaine (MARCAINE) 0.5 % injection 20 mL (not administered)  fentaNYL (SUBLIMAZE) injection 50 mcg (50 mcg Intravenous Given 05/05/17 0821)  methocarbamol (ROBAXIN) tablet 500 mg (500 mg Oral Given 05/05/17 0821)  dexamethasone (DECADRON) injection 10 mg (10 mg Intravenous Given 05/05/17 1011)  fentaNYL (SUBLIMAZE) injection 50 mcg (50 mcg Intravenous Given 05/05/17 1120)     Initial Impression / Assessment and Plan / ED Course   I have reviewed the triage vital signs and the nursing notes.  Pertinent labs & imaging results that were available during my care of the patient were reviewed by me and considered in my medical decision making (see chart for details).  52 year old male presents with acute severe back pain. Vitals are normal. Exam is remarkable for severe back pain but he is able to roll back and forth in bed without assistance. He received two doses of Fentanyl, Robaxin, and Decadron without significant relief of symptoms. Will attempt trigger point injection.  3mL of Bupivacaine were injected in two separate areas in the back. This did provide better relief and he was able to walk but had a lot of difficulty. Shared visit with Dr. Corlis Leak. Will order MRI.  3:50 PM MRI is remarkable for L2-L3 disc herniation, L4-L5 disc degeneration, mild facet degeneration. Discussed results with the family. They have an appointment with ortho tomorrow. They were given rx's for home and a copy of the MRI report.   Final Clinical Impressions(s) / ED Diagnoses   Final diagnoses:  Lumbar herniated disc    ED Discharge Orders    None       Bethel Born, PA-C 05/05/17 1558    Mackuen, Cindee Salt, MD 05/06/17 2259

## 2017-05-05 NOTE — ED Notes (Signed)
Walked pt in the hallway. Pt walked gingerly. Pt stated that he felt dizzy and that the pain was still present in his back. Informed Tresa EndoKelly - PA and Dr. Corlis LeakMacKuen.

## 2017-05-05 NOTE — ED Triage Notes (Signed)
[facility] pain that started yesterday after lifting objects while working in yard. States that the pain is radiating down left arm. Alert and oriented, received fentanyl 150 by EMS, States pain now 7/10

## 2017-05-06 DIAGNOSIS — M5416 Radiculopathy, lumbar region: Secondary | ICD-10-CM | POA: Diagnosis not present

## 2017-05-06 DIAGNOSIS — M5417 Radiculopathy, lumbosacral region: Secondary | ICD-10-CM | POA: Diagnosis not present

## 2017-05-21 DIAGNOSIS — M5136 Other intervertebral disc degeneration, lumbar region: Secondary | ICD-10-CM | POA: Diagnosis not present

## 2017-05-27 ENCOUNTER — Ambulatory Visit (INDEPENDENT_AMBULATORY_CARE_PROVIDER_SITE_OTHER): Payer: 59 | Admitting: Orthotics

## 2017-05-27 DIAGNOSIS — M722 Plantar fascial fibromatosis: Secondary | ICD-10-CM

## 2017-05-27 NOTE — Progress Notes (Signed)
Patient came in today to pick up custom made foot orthotics.  The goals were accomplished and the patient reported no dissatisfaction with said orthotics.  Patient was advised of breakin period and how to report any issues. 

## 2017-06-12 DIAGNOSIS — M5416 Radiculopathy, lumbar region: Secondary | ICD-10-CM | POA: Diagnosis not present

## 2017-06-17 DIAGNOSIS — M5136 Other intervertebral disc degeneration, lumbar region: Secondary | ICD-10-CM | POA: Diagnosis not present

## 2017-06-20 DIAGNOSIS — M5136 Other intervertebral disc degeneration, lumbar region: Secondary | ICD-10-CM | POA: Diagnosis not present

## 2017-08-07 DIAGNOSIS — M5136 Other intervertebral disc degeneration, lumbar region: Secondary | ICD-10-CM | POA: Diagnosis not present

## 2017-11-07 DIAGNOSIS — Z23 Encounter for immunization: Secondary | ICD-10-CM | POA: Diagnosis not present

## 2017-12-17 ENCOUNTER — Telehealth: Payer: Self-pay | Admitting: Family Medicine

## 2017-12-17 NOTE — Telephone Encounter (Signed)
Copied from CRM [id number]. Topic: Appointment Scheduling - Scheduling Inquiry for Clinic >> Dec 17, 2017 10:14 AM Stephannie Li, NT wrote: Reason for CRM: Patient needs a physical before the end of the year ,Dr Patsy Lager only had 02/17/18 available at this time ,he would like to come in before this time and he is ok to see someone else for the physical .please call him at [phone] to schedule him before then

## 2018-01-02 ENCOUNTER — Encounter: Payer: Self-pay | Admitting: Family Medicine

## 2018-01-02 ENCOUNTER — Ambulatory Visit: Payer: 59 | Admitting: Family Medicine

## 2018-01-02 VITALS — BP 110/80 | HR 88 | Temp 98.7°F | Ht 73.5 in | Wt 195.5 lb

## 2018-01-02 DIAGNOSIS — R05 Cough: Secondary | ICD-10-CM | POA: Diagnosis not present

## 2018-01-02 DIAGNOSIS — R059 Cough, unspecified: Secondary | ICD-10-CM | POA: Insufficient documentation

## 2018-01-02 MED ORDER — SUMATRIPTAN SUCCINATE REFILL 6 MG/0.5ML ~~LOC~~ SOCT: 1.0000 | SUBCUTANEOUS | 5 refills | Status: DC | PRN

## 2018-01-02 NOTE — Patient Instructions (Addendum)
Start cetrizine( zyrtec) at bedtime.  Increase flonase to 2 sprays per nostril daily.  If not improving in 3-5 days, new fever or shortness of breath.. Call for further evaluation.

## 2018-01-02 NOTE — Progress Notes (Signed)
   Subjective:    Patient ID: Ian GottronHenry W Bohle, male    DOB: July 20, 1965, 52 y.o.   MRN: [id number]  Cough  This is a new problem. The current episode started 1 to 4 weeks ago ( 3 weeks). The problem has been unchanged. The cough is productive of sputum (brownish green). Associated symptoms include myalgias and a sore throat. Pertinent negatives include no chills, ear congestion, ear pain, fever, nasal congestion, shortness of breath or wheezing. Nothing aggravates the symptoms. Risk factors: nonsmoker. He has tried OTC cough suppressant for the symptoms. The treatment provided no relief. His past medical history is significant for environmental allergies. There is no history of asthma or COPD.  fall ans summer allergies   He has been using flonase 1 spray per nostril for sneeze.  Needs migraine imitrex refill..  None in last year.  Blood pressure 110/80, pulse 88, temperature 98.7 F (37.1 C), temperature source Oral, height 6' 1.5" (1.867 m), weight 195 lb 8 oz (88.7 kg), SpO2 97 %.    Review of Systems  Constitutional: Negative for chills and fever.  HENT: Positive for sore throat. Negative for ear pain.   Respiratory: Positive for cough. Negative for shortness of breath and wheezing.   Musculoskeletal: Positive for myalgias.  Allergic/Immunologic: Positive for environmental allergies.       Objective:   Physical Exam  Constitutional: Vital signs are normal. He appears well-developed and well-nourished.  Non-toxic appearance. He does not appear ill. No distress.  HENT:  Head: Normocephalic and atraumatic.  Right Ear: Hearing, tympanic membrane, external ear and ear canal normal. No tenderness. No foreign bodies. Tympanic membrane is not retracted and not bulging.  Left Ear: Hearing, tympanic membrane, external ear and ear canal normal. No tenderness. No foreign bodies. Tympanic membrane is not retracted and not bulging.  Nose: Nose normal. No mucosal edema or rhinorrhea. Right sinus  exhibits no maxillary sinus tenderness and no frontal sinus tenderness. Left sinus exhibits no maxillary sinus tenderness and no frontal sinus tenderness.  Mouth/Throat: Uvula is midline, oropharynx is clear and moist and mucous membranes are normal. Normal dentition. No dental caries. No oropharyngeal exudate or tonsillar abscesses.  Eyes: Pupils are equal, round, and reactive to light. Conjunctivae, EOM and lids are normal. Lids are everted and swept, no foreign bodies found.  Neck: Trachea normal, normal range of motion and phonation normal. Neck supple. Carotid bruit is not present. No thyroid mass and no thyromegaly present.  Cardiovascular: Normal rate, regular rhythm, S1 normal, S2 normal, normal heart sounds, intact distal pulses and normal pulses. Exam reveals no gallop.  No murmur heard. Pulmonary/Chest: Effort normal and breath sounds normal. No respiratory distress. He has no wheezes. He has no rhonchi. He has no rales.  Abdominal: Soft. Normal appearance and bowel sounds are normal. There is no hepatosplenomegaly. There is no tenderness. There is no rebound, no guarding and no CVA tenderness. No hernia.  Neurological: He is alert. He has normal reflexes.  Skin: Skin is warm, dry and intact. No rash noted.  Psychiatric: He has a normal mood and affect. His speech is normal and behavior is normal. Judgment normal.          Assessment & Plan:

## 2018-01-02 NOTE — Assessment & Plan Note (Signed)
Likely due ot allergies... Add zyrtec and increase to treatment dose of flonase.

## 2018-01-29 ENCOUNTER — Other Ambulatory Visit: Payer: Self-pay | Admitting: Family Medicine

## 2018-01-29 DIAGNOSIS — Z0000 Encounter for general adult medical examination without abnormal findings: Secondary | ICD-10-CM

## 2018-02-03 ENCOUNTER — Other Ambulatory Visit: Payer: Self-pay

## 2018-02-17 ENCOUNTER — Encounter

## 2018-02-17 ENCOUNTER — Encounter: Payer: Self-pay | Admitting: Family Medicine

## 2018-05-12 ENCOUNTER — Other Ambulatory Visit: Payer: Self-pay

## 2018-05-19 ENCOUNTER — Encounter: Payer: Self-pay | Admitting: Family Medicine

## 2018-06-12 ENCOUNTER — Other Ambulatory Visit: Payer: 59

## 2018-06-16 ENCOUNTER — Encounter: Payer: 59 | Admitting: Family Medicine

## 2019-03-10 IMAGING — MR MR LUMBAR SPINE W/O CM
5 series · 34 of 48 positions shown · non-contrast
Comparison: Lumbar radiographs 3379 hours today.

CLINICAL DATA: 51-year-old male with lumbar back pain after heavy
lifting yesterday. Left side radiculopathy.

EXAM:
MRI LUMBAR SPINE WITHOUT CONTRAST
TECHNIQUE: Multiplanar, multisequence MR imaging of the lumbar spine was
performed. No intravenous contrast was administered.

[Series 4: sag ir · sagittal · 4.0mm · 0.55mm/px · 5 of 17 slices shown]
[im 1/17]
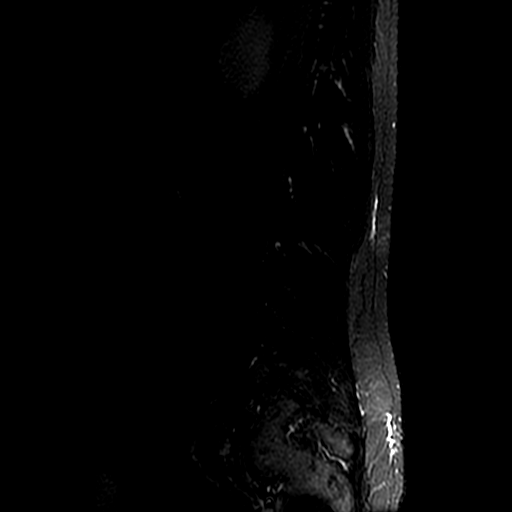
[im 3/17]
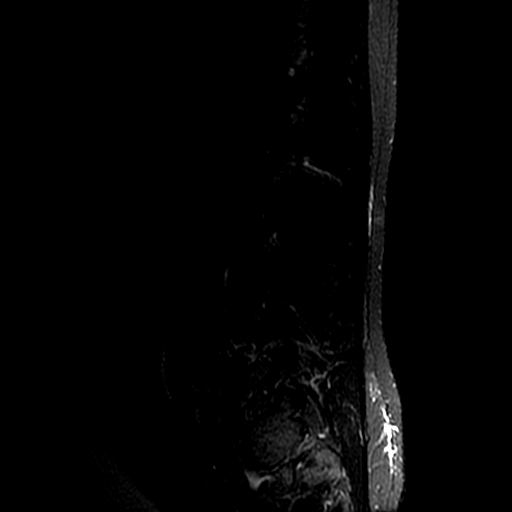
[im 6/17]
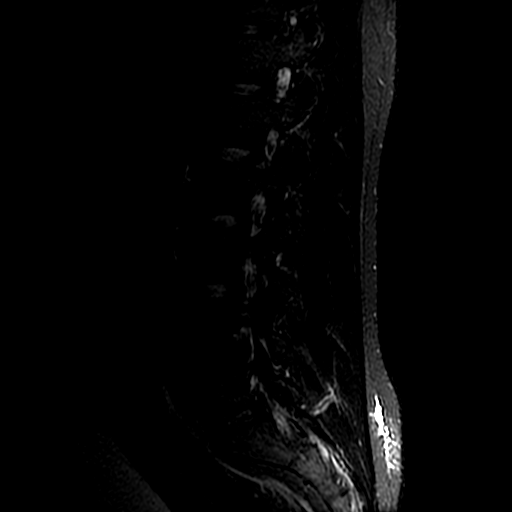
[im 9/17]
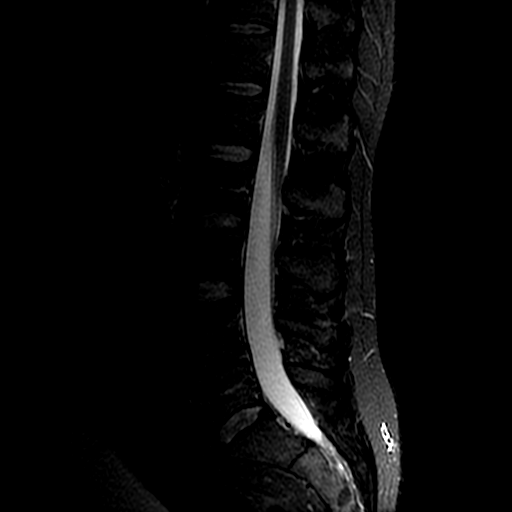
[im 11/17]
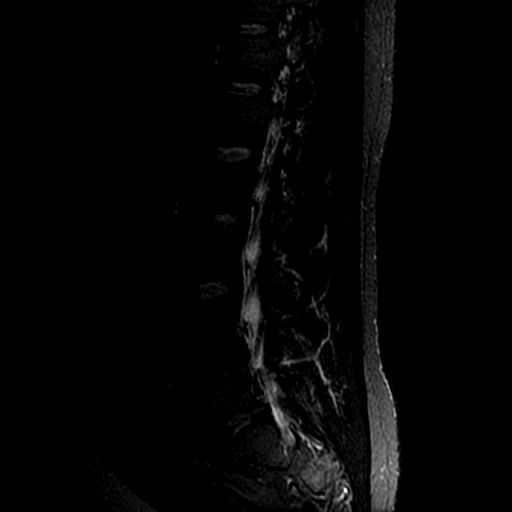

[Series 5: T2 · sagittal · 4.0mm · 1.09mm/px · 7 of 17 slices shown (1 of 2)]
[im 1/17]
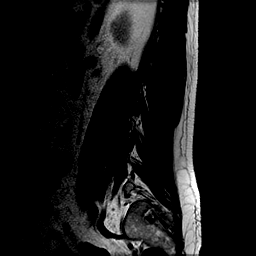
[im 3/17]
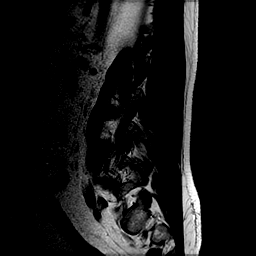
[im 6/17]
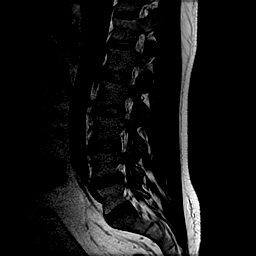
[im 9/17]
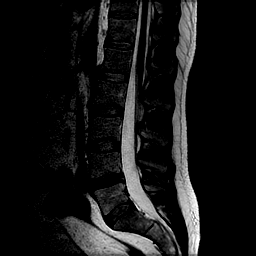
[im 11/17]
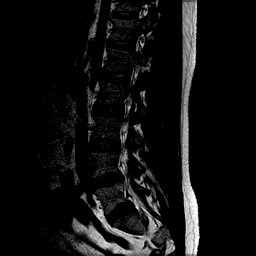
[im 14/17]
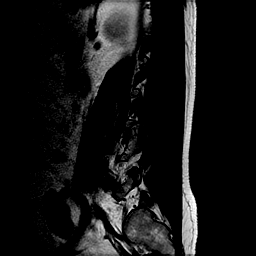
[im 17/17]
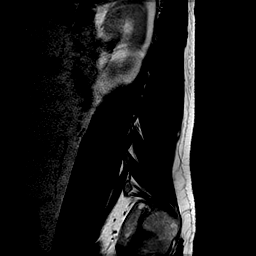

[Series 6: T1 · sagittal · 4.0mm · 0.55mm/px · 6 of 17 slices shown (1 of 2)]
[im 1/17]
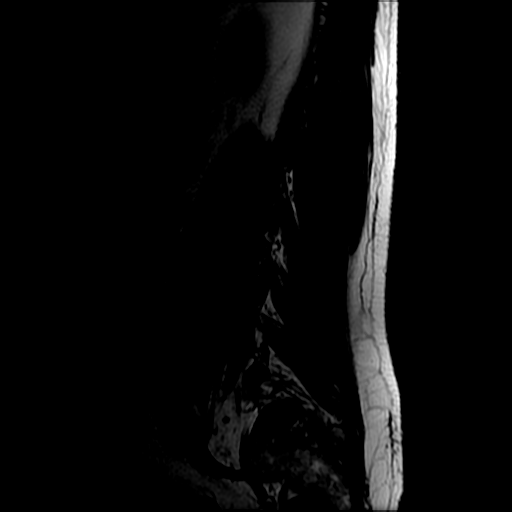
[im 4/17]
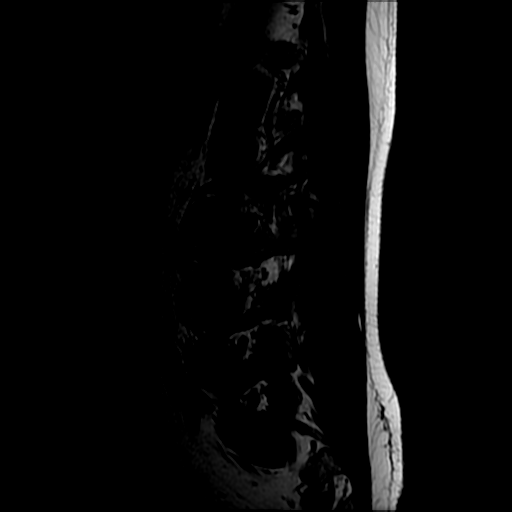
[im 7/17]
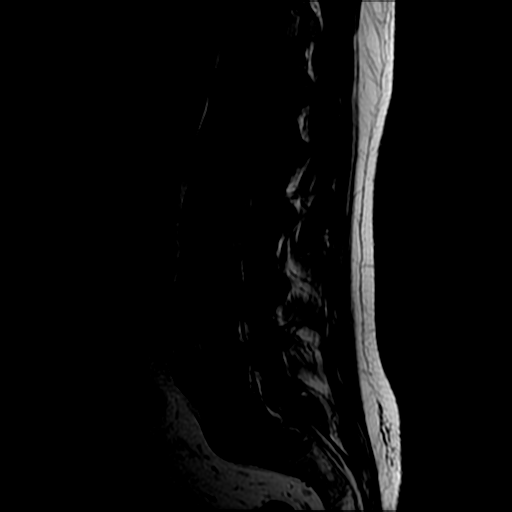
[im 10/17]
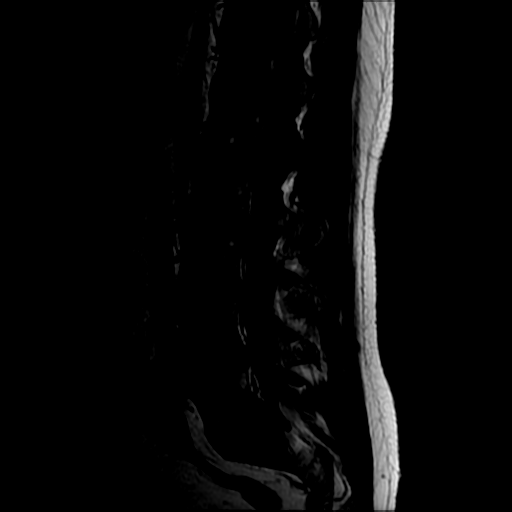
[im 13/17]
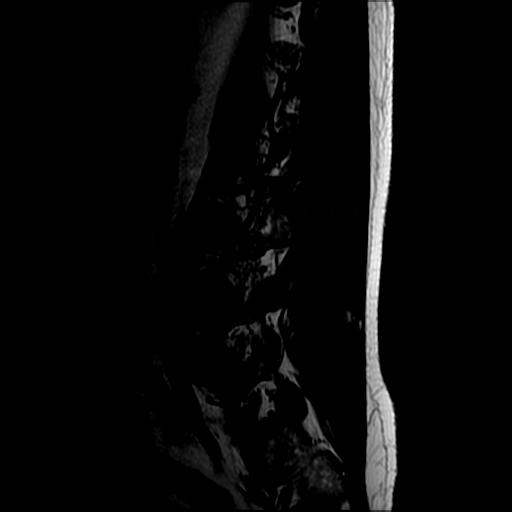
[im 17/17]
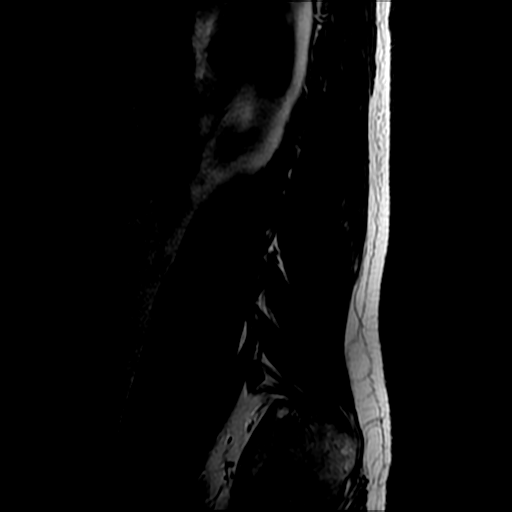

[Series 7: T2 · axial · 4.0mm · 0.78mm/px · z∈[-115,+94]mm · 8 of 38 slices shown (2 of 2)]
[im 1/38]
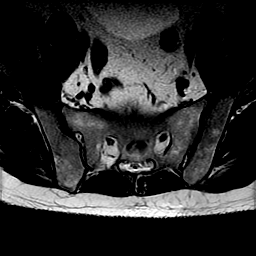
[im 6/38]
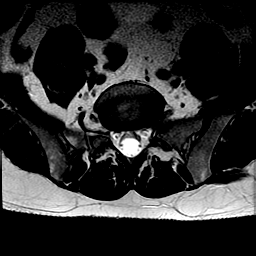
[im 12/38]
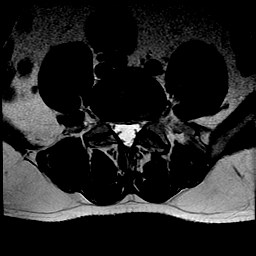
[im 18/38]
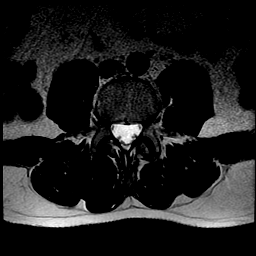
[im 20/38]
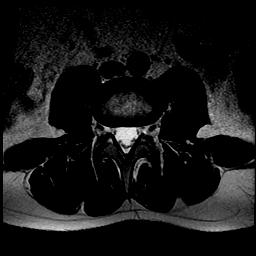
[im 26/38]
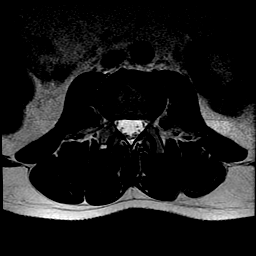
[im 32/38]
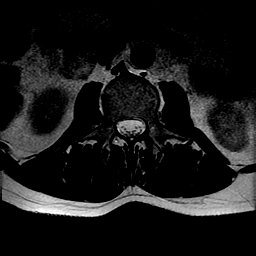
[im 38/38]
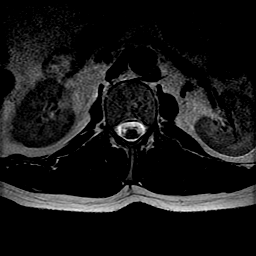

[Series 8: T1 · axial · 4.0mm · 0.78mm/px · z∈[-115,+94]mm · 8 of 38 slices shown (2 of 2)]
[im 1/38]
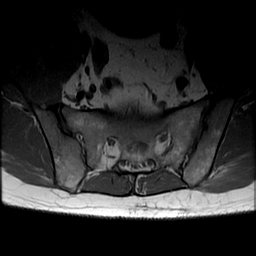
[im 6/38]
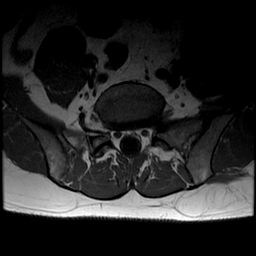
[im 12/38]
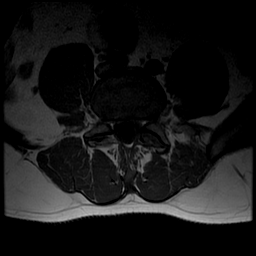
[im 18/38]
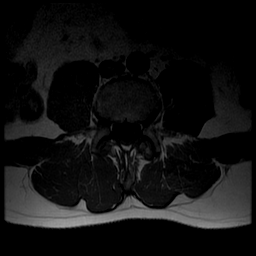
[im 20/38]
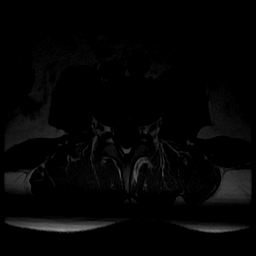
[im 26/38]
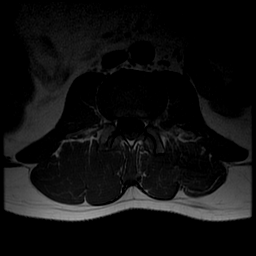
[im 32/38]
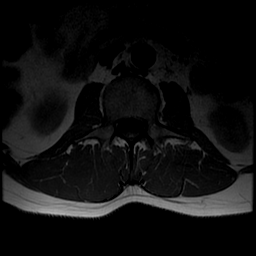
[im 38/38]
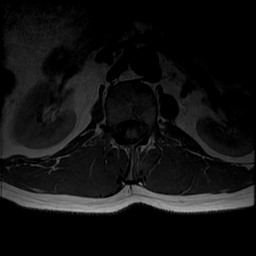

[34 of 48 positions shown; findings below may reference images not displayed]

FINDINGS: Segmentation:  Normal on today's radiographs.

Alignment: Increased straightening of lumbar lordosis compared to
the earlier radiographs. No spondylolisthesis.

Vertebrae: Visualized bone marrow signal is within normal limits. No
marrow edema or evidence of acute osseous abnormality. Intact
visible sacrum and SI joints.

Conus medullaris and cauda equina: Conus extends to the L1-L2 level.
Conus and cauda equina appear normal.

Paraspinal and other soft tissues: Partially visible mild urinary
bladder distention (series 4, image 10). Visualized abdominal
viscera and paraspinal soft tissues are within normal limits.

Disc levels:

T11-T12: Negative.

T12-L1:  Negative.

L1-L2:  Negative.

L2-L3: Subtle left foraminal/extraforaminal disc protrusion (series
7, image 12). This abuts the exiting left L2 nerve which appears
mildly swollen.

Otherwise negative.

L3-L4:  Mild bilateral facet hypertrophy.  Otherwise negative.

L4-L5: Disc desiccation. Mild circumferential disc bulge. Small
central annular fissure (series 7, image 26). Mild bilateral facet
hypertrophy. No spinal or lateral recess stenosis. Borderline to
mild bilateral L4 foraminal stenosis.

L5-S1:  Negative.
IMPRESSION: 1. Symptomatic abnormality with regard to left side radiculopathy is
favored to be a subtle left foraminal disc herniation at L2-L3.
Query left L2 radiculitis.
2. Mild disc degeneration at L4-L5 with annular fissure but no
associated disc herniation or neural impingement.
3. Mild facet degeneration at L3-L4 and L4-L5. No acute osseous
abnormality.

## 2019-03-10 IMAGING — DX DG LUMBAR SPINE COMPLETE 4+V
5 series · 5 of 5 positions shown · non-contrast
Comparison: None

CLINICAL DATA: Patient arrived by [REDACTED] for severe back pain that
started yesterday after lifting objects while working in yard.
States that the pain is radiating down left arm. Alert and oriented,
received fentanyl 150 by EMS, States pain now [DATE]

EXAM:
LUMBAR SPINE - COMPLETE 4+ VIEW

[l-spine ap]
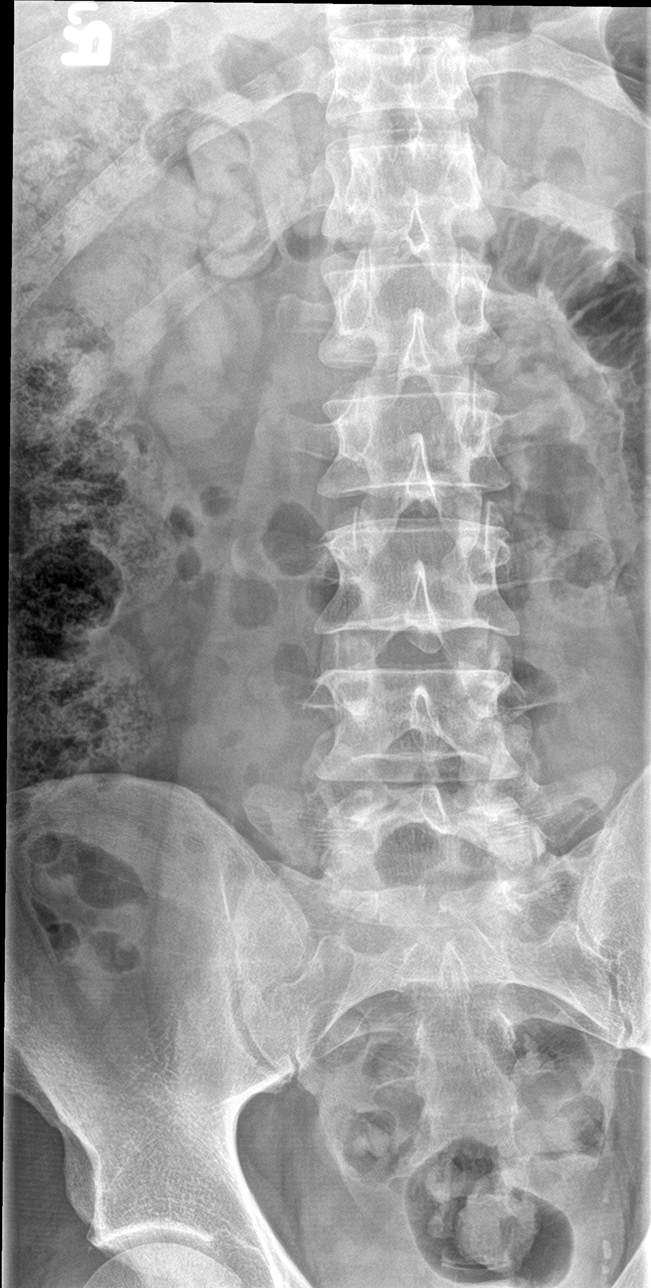

[l-spine obl (1 of 2)]
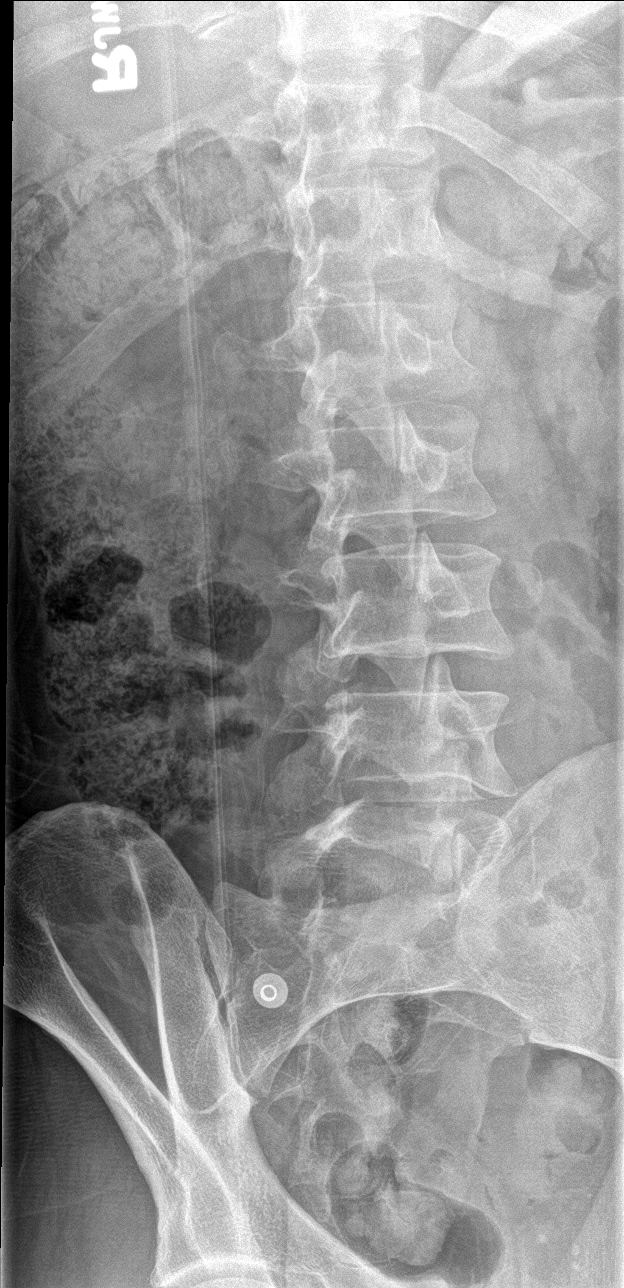

[l-spine lat]
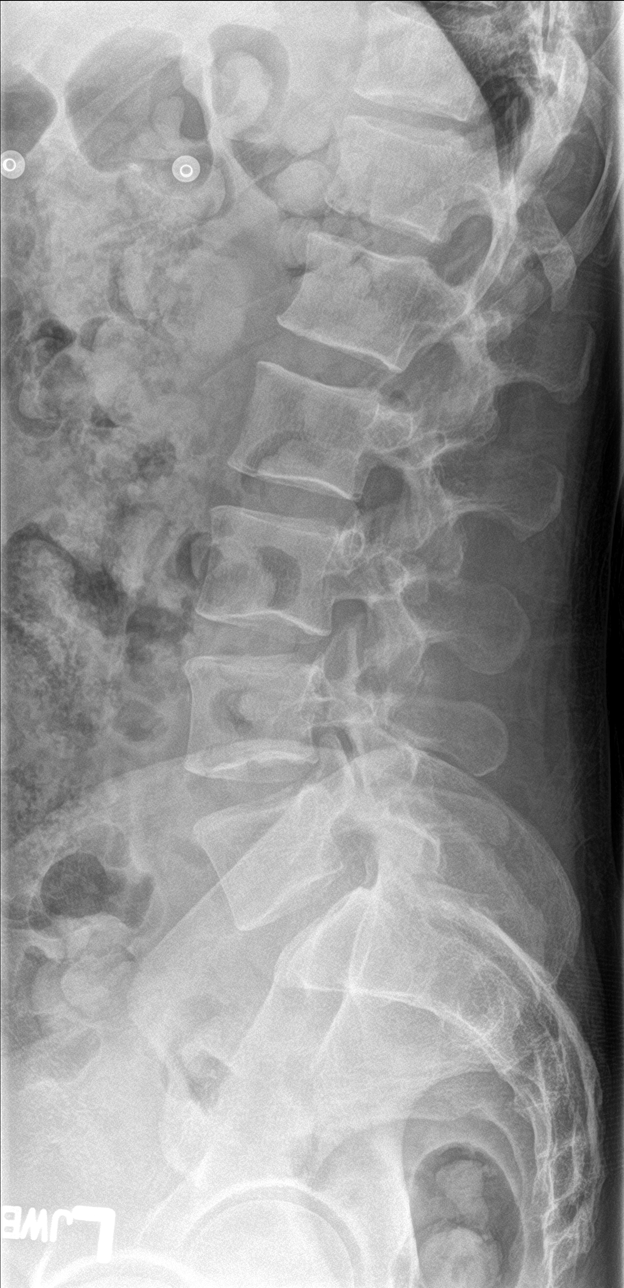

[l-spine spot]
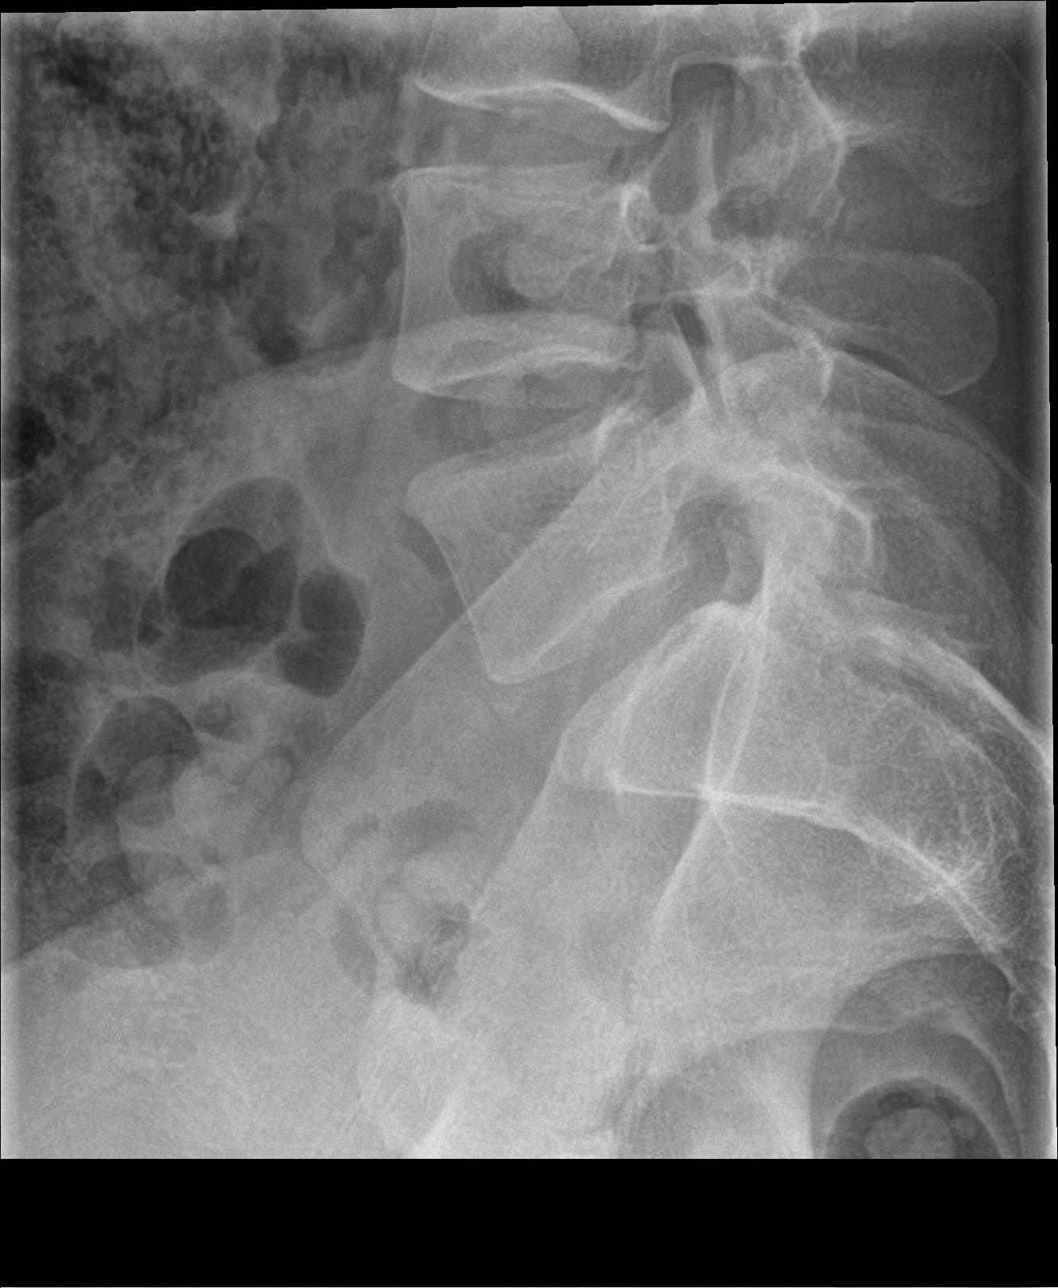

[l-spine obl (2 of 2)]
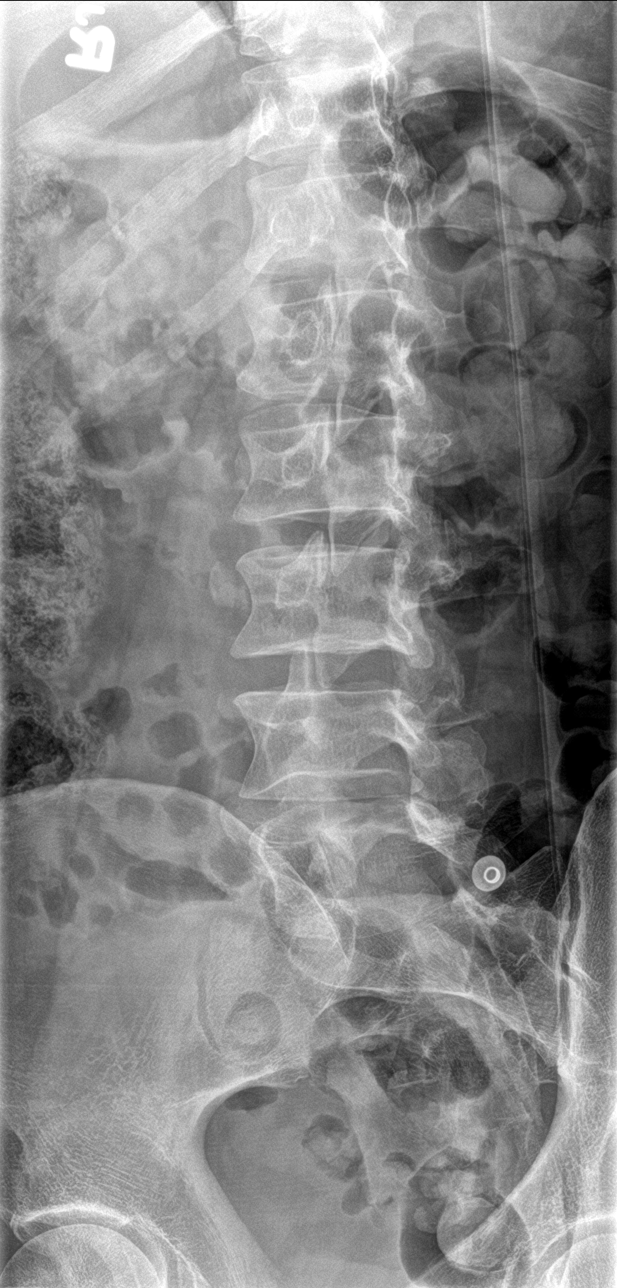

[5 of 5 positions shown; findings below may reference images not displayed]

FINDINGS: Normal alignment of lumbar vertebral bodies. No loss of vertebral
body height or disc height. No pars fracture. No subluxation.
IMPRESSION: No acute findings lumbar spine..

## 2019-03-31 ENCOUNTER — Other Ambulatory Visit: Payer: Self-pay

## 2019-04-02 ENCOUNTER — Other Ambulatory Visit: Payer: Self-pay

## 2019-07-01 ENCOUNTER — Encounter: Payer: Self-pay | Admitting: Podiatry

## 2019-07-01 ENCOUNTER — Ambulatory Visit: Payer: 59 | Admitting: Podiatry

## 2019-07-01 ENCOUNTER — Ambulatory Visit (INDEPENDENT_AMBULATORY_CARE_PROVIDER_SITE_OTHER): Payer: 59

## 2019-07-01 ENCOUNTER — Other Ambulatory Visit: Payer: Self-pay

## 2019-07-01 DIAGNOSIS — M722 Plantar fascial fibromatosis: Secondary | ICD-10-CM

## 2019-07-01 DIAGNOSIS — M79671 Pain in right foot: Secondary | ICD-10-CM

## 2019-07-01 MED ORDER — DICLOFENAC SODIUM 75 MG PO TBEC: 75.0000 mg | DELAYED_RELEASE_TABLET | Freq: Two times a day (BID) | ORAL | 2 refills | Status: DC

## 2019-07-01 NOTE — Progress Notes (Signed)
Subjective:   Patient ID: Ian Golden, male   DOB: 54 y.o.   MRN: [id number]   HPI Patient presents stating he has developed intense pain in the bottom of his right heel and states is been very sore and making it hard for him to walk.  Patient states that it has been getting worse and it was bothered him before and that severe over the last few weeks   ROS      Objective:  Physical Exam  Neurovascular status intact with inflammation pain of the plantar heel right at the insertional point tendon calcaneus with fluid buildup around the medial band     Assessment:  Acute plantar fasciitis right with inflammation with orthotics which have lost their cushion mechanism     Plan:  H&P reviewed condition and today I did sterile prep injected the plantar fascia right 3 mg Kenalog 5 mg liken applied fascial brace to lift up the arch placed on diclofenac 75 mg twice daily and gave instructions on physical therapy.  Patient will be seen back and will bring his orthotics and will have them rehabilitated at that time  X-rays indicate small spur no indication stress fracture arthritis

## 2019-07-01 NOTE — Patient Instructions (Signed)

## 2019-07-16 ENCOUNTER — Ambulatory Visit: Payer: 59 | Admitting: Podiatry

## 2019-07-17 ENCOUNTER — Telehealth (INDEPENDENT_AMBULATORY_CARE_PROVIDER_SITE_OTHER): Payer: 59 | Admitting: Family Medicine

## 2019-07-17 ENCOUNTER — Telehealth: Payer: Self-pay

## 2019-07-17 ENCOUNTER — Encounter: Payer: Self-pay | Admitting: Family Medicine

## 2019-07-17 VITALS — Temp 98.2°F

## 2019-07-17 DIAGNOSIS — J019 Acute sinusitis, unspecified: Secondary | ICD-10-CM | POA: Diagnosis not present

## 2019-07-17 DIAGNOSIS — B9789 Other viral agents as the cause of diseases classified elsewhere: Secondary | ICD-10-CM | POA: Diagnosis not present

## 2019-07-17 NOTE — Patient Instructions (Signed)
Likely viral sinusitis.  treat with mucinex twice daily,  Nasal saline irrigation and flonase 2 sprays per nostril daily.  Call if not turning  Corner after 7-10 days or if worsening with unilateral facial pain, fever or SOB.  Consider COVID testing if not improving as expected.

## 2019-07-17 NOTE — Progress Notes (Addendum)
VIRTUAL VISIT Due to national recommendations of social distancing due to COVID 19, a virtual visit is felt to be most appropriate for this patient at this time.   I connected with the patient on 07/22/19 at 11:40 AM EDT by virtual telehealth platform and verified that I am speaking with the correct person using two identifiers.   I discussed the limitations, risks, security and privacy concerns of performing an evaluation and management service by  virtual telehealth platform and the availability of in person appointments. I also discussed with the patient that there may be a patient responsible charge related to this service. The patient expressed understanding and agreed to proceed.  Patient location: Home Provider Location: [facility] Participants: Kerby Nora and [name]   Chief Complaint  Patient presents with  . Sore Throat  . Facial Pressure  . Nasal Congestion  . Cough    with greenish phelgm    History of Present Illness: Cough This is a new problem. The current episode started in the past 7 days (4 days ago). The problem has been gradually worsening. The cough is productive of sputum. Associated symptoms include nasal congestion and a sore throat. Pertinent negatives include no chills, ear congestion, ear pain, fever, myalgias or shortness of breath. Associated symptoms comments: Sinus pressure and pain, sore throat No diarrhea, no loss of taste or smell.  . The symptoms are aggravated by lying down. Risk factors: nonsmoer. Treatments tried: motrin for sore thraot. His past medical history is significant for environmental allergies. There is no history of asthma or COPD.  No antibiotics in last month   daughter and son with runny nose and sneeze.  COVID 19 screen:  No recent travel or known exposure to COVID19  He has been vaccinated for COVID19.The importance of social distancing was discussed today.     Review of Systems  Constitutional: Negative for  chills and fever.  HENT: Positive for sore throat. Negative for ear pain.   Respiratory: Positive for cough. Negative for shortness of breath.   Musculoskeletal: Negative for myalgias.  Endo/Heme/Allergies: Positive for environmental allergies.      Past Medical History:  Diagnosis Date  . Allergic rhinitis, cause unspecified   . Allergy   . Hemorrhoids   . Migraine   . Ulcer     reports that he has never smoked. He has never used smokeless tobacco. He reports that he does not drink alcohol or use drugs.   Current Outpatient Medications:  .  B-D TB SYRINGE 1CC/25GX5/8" 25G X 5/8" 1 ML MISC, , Disp: , Rfl: 5 .  diclofenac (VOLTAREN) 75 MG EC tablet, Take 1 tablet (75 mg total) by mouth 2 (two) times daily., Disp: 50 tablet, Rfl: 2 .  fluocinonide ointment (LIDEX) 0.05 %, Apply topically 2 (two) times daily., Disp: 60 g, Rfl: 0 .  SUMAtriptan Succinate Refill 6 MG/0.5ML SOCT, Inject 1 Syringe into the skin as needed. Inject one syringe subcutaneously at onset of headache. May repeat in 2 hours prn x 1, Disp: 5 Syringe, Rfl: 5   Observations/Objective: Temperature 98.2 F (36.8 C), temperature source Oral.  Physical Exam  Physical Exam Constitutional:      General: The patient is not in acute distress. Pulmonary:     Effort: Pulmonary effort is normal. No respiratory distress.  Neurological:     Mental Status: The patient is alert and oriented to person, place, and time.  Psychiatric:        Mood and  Affect: Mood normal.        Behavior: Behavior normal.   Assessment and Plan   Acute viral sinusitis Treat with mucinex twice daily,  Nasal saline irrigation and flonase 2 sprays per nostril daily.  Call if not turning  Corner after 7-10 days or if worsening with unilateral facial pain, fever or SOB.  Consider COVID testing and in person exam if not improving as expected.       Eliezer Lofts, MD

## 2019-07-17 NOTE — Telephone Encounter (Signed)
On 07/15/19 pt started with sinus pressure and head congestion. Now pt also has prod cough with yellow green phlegm, slight scratchy throat. No fever, SOB,CP, difficulty getting breath or abd pain. Pt scheduled virtual appt 07/17/19 at 11:40 with Dr Ermalene Searing. ri

## 2019-07-17 NOTE — Assessment & Plan Note (Signed)
Treat with mucinex twice daily,  Nasal saline irrigation and flonase 2 sprays per nostril daily.  Call if not turning  Corner after 7-10 days or if worsening with unilateral facial pain, fever or SOB.  Consider COVID testing and in person exam if not improving as expected.

## 2019-07-27 ENCOUNTER — Other Ambulatory Visit (INDEPENDENT_AMBULATORY_CARE_PROVIDER_SITE_OTHER): Payer: 59

## 2019-07-27 DIAGNOSIS — Z79899 Other long term (current) drug therapy: Secondary | ICD-10-CM | POA: Diagnosis not present

## 2019-07-27 DIAGNOSIS — Z13220 Encounter for screening for lipoid disorders: Secondary | ICD-10-CM

## 2019-07-27 DIAGNOSIS — Z125 Encounter for screening for malignant neoplasm of prostate: Secondary | ICD-10-CM

## 2019-07-27 DIAGNOSIS — Z131 Encounter for screening for diabetes mellitus: Secondary | ICD-10-CM | POA: Diagnosis not present

## 2019-07-27 LAB — BASIC METABOLIC PANEL
BUN: 24 mg/dL — ABNORMAL HIGH (ref 6–23)
CO2: 29 mEq/L (ref 19–32)
Calcium: 8.9 mg/dL (ref 8.4–10.5)
Chloride: 106 mEq/L (ref 96–112)
Creatinine, Ser: 1.03 mg/dL (ref 0.40–1.50)
GFR: 75.03 mL/min (ref >60.00)
Glucose, Bld: 89 mg/dL (ref 70–99)
Potassium: 4.2 mEq/L (ref 3.5–5.1)
Sodium: 139 mEq/L (ref 135–145)

## 2019-07-27 LAB — HEPATIC FUNCTION PANEL
ALT: 22 U/L (ref 0–53)
AST: 22 U/L (ref 0–37)
Albumin: 3.8 g/dL (ref 3.5–5.2)
Alkaline Phosphatase: 65 U/L (ref 39–117)
Bilirubin, Direct: 0.1 mg/dL (ref 0.0–0.3)
Total Bilirubin: 0.4 mg/dL (ref 0.2–1.2)
Total Protein: 6.2 g/dL (ref 6.0–8.3)

## 2019-07-27 LAB — CBC WITH DIFFERENTIAL/PLATELET
Basophils Absolute: 0 10*3/uL (ref 0.0–0.1)
Basophils Relative: 0.7 % (ref 0.0–3.0)
Eosinophils Absolute: 0.4 10*3/uL (ref 0.0–0.7)
Eosinophils Relative: 7.1 % — ABNORMAL HIGH (ref 0.0–5.0)
HCT: 41 % (ref 39.0–52.0)
Hemoglobin: 14 g/dL (ref 13.0–17.0)
Lymphocytes Relative: 30.4 % (ref 12.0–46.0)
Lymphs Abs: 1.8 10*3/uL (ref 0.7–4.0)
MCHC: 34 g/dL (ref 30.0–36.0)
MCV: 87.4 fl (ref 78.0–100.0)
Monocytes Absolute: 0.5 10*3/uL (ref 0.1–1.0)
Monocytes Relative: 9.2 % (ref 3.0–12.0)
Neutro Abs: 3.1 10*3/uL (ref 1.4–7.7)
Neutrophils Relative %: 52.6 % (ref 43.0–77.0)
Platelets: 212 10*3/uL (ref 150.0–400.0)
RBC: 4.69 Mil/uL (ref 4.22–5.81)
RDW: 13.3 % (ref 11.5–15.5)
WBC: 5.9 10*3/uL (ref 4.0–10.5)

## 2019-07-27 LAB — LIPID PANEL
Cholesterol: 157 mg/dL (ref 0–200)
HDL: 27.6 mg/dL — ABNORMAL LOW (ref >39.00)
LDL Cholesterol: 110 mg/dL — ABNORMAL HIGH (ref 0–99)
NonHDL: 129.48
Total CHOL/HDL Ratio: 6
Triglycerides: 99 mg/dL (ref 0.0–149.0)
VLDL: 19.8 mg/dL (ref 0.0–40.0)

## 2019-07-27 LAB — HEMOGLOBIN A1C: Hgb A1c MFr Bld: 6 % (ref 4.6–6.5)

## 2019-07-28 LAB — PSA, TOTAL WITH REFLEX TO PSA, FREE: PSA, Total: 0.2 ng/mL (ref <=4.0)

## 2019-07-29 ENCOUNTER — Encounter: Payer: Self-pay | Admitting: Family Medicine

## 2019-07-29 ENCOUNTER — Other Ambulatory Visit: Payer: Self-pay

## 2019-07-29 ENCOUNTER — Ambulatory Visit (INDEPENDENT_AMBULATORY_CARE_PROVIDER_SITE_OTHER): Payer: 59 | Admitting: Family Medicine

## 2019-07-29 VITALS — BP 118/80 | HR 73 | Temp 98.2°F | Ht 73.5 in | Wt 193.5 lb

## 2019-07-29 DIAGNOSIS — Z0000 Encounter for general adult medical examination without abnormal findings: Secondary | ICD-10-CM

## 2019-07-29 DIAGNOSIS — Z23 Encounter for immunization: Secondary | ICD-10-CM | POA: Diagnosis not present

## 2019-07-29 DIAGNOSIS — R413 Other amnesia: Secondary | ICD-10-CM | POA: Diagnosis not present

## 2019-07-29 NOTE — Progress Notes (Signed)
Ian Golden T. Ian Kutter, MD, [place] at [facility] [place], [address]  Phone: [phone]  FAX: [place] - 54 y.o. male  MRN [id number]  Date of Birth: 03/25/1965  Date: 07/29/2019  PCP: Owens Loffler, MD  Referral: Owens Loffler, MD  Chief Complaint  Patient presents with  . Annual Exam    This visit occurred during the SARS-CoV-2 public health emergency.  Safety protocols were in place, including screening questions prior to the visit, additional usage of staff PPE, and extensive cleaning of exam room while observing appropriate contact time as indicated for disinfecting solutions.   Patient Care Team: Owens Loffler, MD as PCP - General Subjective:   Ian Golden is a 54 y.o. pleasant patient who presents with the following:  Preventative Health Maintenance Visit:  Health Maintenance Summary Reviewed and updated, unless pt declines services.  Tobacco History Reviewed. Alcohol: No concerns, no excessive use Exercise Habits: Some activity, rec at least 30 mins 5 times a week STD concerns: no risk or activity to increase risk Drug Use: None  Last couple of years.  Noticed a laps being able to remember some things.  Wife is worried.  For example, will forget picking things up some.  Both he and his wife have noticed this.  She is quite concerned.  She does have a family member who has early onset Alzheimer's, and they have concerned that he could possibly have this.  He is not had any acute events such as a stroke in the past.  Mom, ? Mini-strokes.  No other family history.  Had some having some congestion coming up from prior viral.   Health Maintenance  Topic Date Due  . Hepatitis C Screening  Never done  . INFLUENZA VACCINE  09/20/2019  . COLONOSCOPY  03/27/2025  . TETANUS/TDAP  07/28/2029  . COVID-19 Vaccine  Completed  . HIV Screening   Completed   Immunization History  Administered Date(s) Administered  . Influenza,inj,Quad PF,6+ Mos 01/02/2017, 11/07/2017  . PFIZER SARS-COV-2 Vaccination 03/26/2019, 04/16/2019  . Td 07/29/2019  . Tdap 04/21/2008  . Zoster Recombinat (Shingrix) 03/03/2019   Patient Active Problem List   Diagnosis Date Noted  . Hemorrhoids 05/19/2013  . MIGRAINE WITHOUT AURA 05/03/2010  . ALLERGIC RHINITIS 05/26/2009  . GASTRIC ULCER, HX OF 05/26/2009  . SHINGLES, HX OF 05/26/2009    Past Medical History:  Diagnosis Date  . Allergic rhinitis, cause unspecified   . Allergy   . Hemorrhoids   . Migraine   . Ulcer     Past Surgical History:  Procedure Laterality Date  . WISDOM TOOTH EXTRACTION     with sedation    Family History  Problem Relation Age of Onset  . Colon polyps Father   . Colon cancer Neg Hx   . Esophageal cancer Neg Hx   . Rectal cancer Neg Hx   . Stomach cancer Neg Hx     Past Medical History, Surgical History, Social History, Family History, Problem List, Medications, and Allergies have been reviewed and updated if relevant.  Review of Systems: Pertinent positives are listed above.  Otherwise, a full 14 point review of systems has been done in full and it is negative except where it is noted positive.  Objective:   BP 118/80   Pulse 73   Temp 98.2 F (36.8 C) (Temporal)   Ht 6' 1.5" (1.867 m)  Wt 193 lb 8 oz (87.8 kg)   SpO2 96%   BMI 25.18 kg/m  Ideal Body Weight: Weight in (lb) to have BMI = 25: 191.7  Ideal Body Weight: Weight in (lb) to have BMI = 25: 191.7 No exam data present Depression screen [facility] 2/9 07/29/2019 12/17/2016  Decreased Interest 0 0  Down, Depressed, Hopeless 0 0  PHQ - 2 Score 0 0     GEN: well developed, well nourished, no acute distress Eyes: conjunctiva and lids normal, PERRLA, EOMI ENT: TM clear, nares clear, oral exam WNL Neck: supple, no lymphadenopathy, no thyromegaly, no JVD Pulm: clear to auscultation and percussion,  respiratory effort normal CV: regular rate and rhythm, S1-S2, no murmur, rub or gallop, no bruits, peripheral pulses normal and symmetric, no cyanosis, clubbing, edema or varicosities GI: soft, non-tender; no hepatosplenomegaly, masses; active bowel sounds all quadrants GU: no hernia, testicular mass, penile discharge Lymph: no cervical, axillary or inguinal adenopathy MSK: gait normal, muscle tone and strength WNL, no joint swelling, effusions, discoloration, crepitus  SKIN: clear, good turgor, color WNL, no rashes, lesions, or ulcerations Neuro: normal strength, sensation, and motion  Folstein: 24 out of 30  Psych: alert; oriented to person, place and time, normally interactive and not anxious or depressed in appearance.  All labs reviewed with patient. Results for orders placed or performed in visit on 07/27/19  Lipid panel  Result Value Ref Range   Cholesterol 157 0 - 200 mg/dL   Triglycerides 52.8 0.0 - 149.0 mg/dL   HDL 41.32 (L) >44.01 mg/dL   VLDL 02.7 0.0 - 25.3 mg/dL   LDL Cholesterol 664 (H) 0 - 99 mg/dL   Total CHOL/HDL Ratio 6    NonHDL 129.48   CBC with Differential/Platelet  Result Value Ref Range   WBC 5.9 4.0 - 10.5 K/uL   RBC 4.69 4.22 - 5.81 Mil/uL   Hemoglobin 14.0 13.0 - 17.0 g/dL   HCT 40.3 47.4 - 25.9 %   MCV 87.4 78.0 - 100.0 fl   MCHC 34.0 30.0 - 36.0 g/dL   RDW 56.3 87.5 - 64.3 %   Platelets 212.0 150.0 - 400.0 K/uL   Neutrophils Relative % 52.6 43.0 - 77.0 %   Lymphocytes Relative 30.4 12.0 - 46.0 %   Monocytes Relative 9.2 3.0 - 12.0 %   Eosinophils Relative 7.1 (H) 0.0 - 5.0 %   Basophils Relative 0.7 0.0 - 3.0 %   Neutro Abs 3.1 1.4 - 7.7 K/uL   Lymphs Abs 1.8 0.7 - 4.0 K/uL   Monocytes Absolute 0.5 0.1 - 1.0 K/uL   Eosinophils Absolute 0.4 0.0 - 0.7 K/uL   Basophils Absolute 0.0 0.0 - 0.1 K/uL  Hepatic function panel  Result Value Ref Range   Total Bilirubin 0.4 0.2 - 1.2 mg/dL   Bilirubin, Direct 0.1 0.0 - 0.3 mg/dL   Alkaline  Phosphatase 65 39 - 117 U/L   AST 22 0 - 37 U/L   ALT 22 0 - 53 U/L   Total Protein 6.2 6.0 - 8.3 g/dL   Albumin 3.8 3.5 - 5.2 g/dL  Basic metabolic panel  Result Value Ref Range   Sodium 139 135 - 145 mEq/L   Potassium 4.2 3.5 - 5.1 mEq/L   Chloride 106 96 - 112 mEq/L   CO2 29 19 - 32 mEq/L   Glucose, Bld 89 70 - 99 mg/dL   BUN 24 (H) 6 - 23 mg/dL   Creatinine, Ser 3.29 0.40 - 1.50 mg/dL  GFR 75.03 >60.00 mL/min   Calcium 8.9 8.4 - 10.5 mg/dL  Hemoglobin P7T  Result Value Ref Range   Hgb A1c MFr Bld 6.0 4.6 - 6.5 %  PSA, Total with Reflex to PSA, Free  Result Value Ref Range   PSA, Total 0.2 < OR = 4.0 ng/mL    Assessment and Plan:     ICD-10-CM   1. Healthcare maintenance  Z00.00   2. Need for prophylactic vaccination with tetanus-diphtheria (Td)  Z23 Td : Tetanus/diphtheria >7yo Preservative  free  3. 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  I think it is very reasonable to consult neurology to make sure he does not have any organic brain disease or potential early onset dementia.  Health Maintenance Exam: The patient's preventative maintenance and recommended screening tests for an annual wellness exam were reviewed in full today. Brought up to date unless services declined.  Counselled on the importance of diet, exercise, and its role in overall health and mortality. The patient's FH and SH was reviewed, including their home life, tobacco status, and drug and alcohol status.  Follow-up in 1 year for physical exam or additional follow-up below.  Follow-up: No follow-ups on file. Or follow-up in 1 year if not noted.  No orders of the defined types were placed in this encounter.  Medications Discontinued During This Encounter  Medication Reason  . B-D TB SYRINGE 1CC/25GX5/8" 25G X 5/8" 1 ML  MISC Completed Course   Orders Placed This Encounter  Procedures  . Td : Tetanus/diphtheria >7yo Preservative  free  . Ambulatory referral to Neurology    Signed,  Karleen Hampshire T. Cherysh Epperly, MD   Allergies as of 07/29/2019   No Known Allergies     Medication List       Accurate as of July 29, 2019  1:58 PM. If you have any questions, ask your nurse or doctor.        STOP taking these medications   B-D TB SYRINGE 1CC/25GX5/8" 25G X 5/8" 1 ML Misc Generic drug: TUBERCULIN SYR 1CC/25GX5/8" Stopped by: Hannah Beat, MD     TAKE these medications   diclofenac 75 MG EC tablet Commonly known as: VOLTAREN Take 1 tablet (75 mg total) by mouth 2 (two) times daily.   fluocinonide ointment 0.05 % Commonly known as: LIDEX Apply topically 2 (two) times daily.   SUMAtriptan Succinate Refill 6 MG/0.5ML Soct Inject 1 Syringe into the skin as needed. Inject one syringe subcutaneously at onset of headache. May repeat in 2 hours prn x 1

## 2019-07-30 ENCOUNTER — Encounter: Payer: Self-pay | Admitting: Podiatry

## 2019-07-30 ENCOUNTER — Ambulatory Visit: Payer: 59 | Admitting: Podiatry

## 2019-07-30 DIAGNOSIS — M722 Plantar fascial fibromatosis: Secondary | ICD-10-CM

## 2019-08-02 NOTE — Progress Notes (Signed)
Subjective:   Patient ID: Ian Golden, male   DOB: 53 y.o.   MRN: [id number]   HPI Patient states he has had some pain in his right heel arch and he has had previous orthotics that is not sure about getting them recovered versus getting new orthotics   ROS      Objective:  Physical Exam  Neurovascular status intact with patient found to have plantar heel pain right with inflammation fluid around the medial band and swelling associated with it     Assessment:  Chronic plantar fasciitis right with inflammation fluid buildup     Plan:  H&P reviewed condition and did sterile prep 3 mg Kenalog 5 mg Xylocaine and we can go ahead and rehabilitate his orthotics.  He will be seen back as needed and were going to try this first to see what we can do to help

## 2019-08-19 NOTE — Telephone Encounter (Signed)
Pt had 07/17/19 appt

## 2019-08-20 ENCOUNTER — Other Ambulatory Visit: Payer: Self-pay

## 2019-08-20 ENCOUNTER — Ambulatory Visit: Payer: 59 | Admitting: Orthotics

## 2019-08-20 DIAGNOSIS — M722 Plantar fascial fibromatosis: Secondary | ICD-10-CM

## 2019-08-20 DIAGNOSIS — M79671 Pain in right foot: Secondary | ICD-10-CM

## 2019-08-20 NOTE — Progress Notes (Signed)
Patient came in today to pick up custom made foot orthotics.  The goals were accomplished and the patient reported no dissatisfaction with said orthotics.  Patient was advised of breakin period and how to report any issues. 

## 2019-09-10 ENCOUNTER — Other Ambulatory Visit: Payer: Self-pay | Admitting: Podiatry

## 2019-09-10 DIAGNOSIS — M722 Plantar fascial fibromatosis: Secondary | ICD-10-CM

## 2019-10-12 ENCOUNTER — Ambulatory Visit: Payer: 59 | Admitting: Neurology

## 2019-11-17 NOTE — Progress Notes (Signed)
[facility]    Provider:  Dr Lucia Gaskins Requesting Provider: Hannah Beat, MD Primary Care Provider:  Hannah Beat, MD  CC:  Memory problems  HPI:  Ian Golden is a 54 y.o. male here as requested by Hannah Beat, MD for memory difficulty.  He has a past medical history of migraine without aura.  I reviewed Dr. Randalyn Rhea notes, last couple years noticed lapses and try to remember things and his wife is worried, he will forget picking things up, both him and his wife have noticed that she is quite concerned, she does have a family member who had early onset Alzheimer's and they have concerned he could possibly have this, he has not had any acute event such as a stroke in the past.  Recent lab reviewed, July 27, 2019, including hemoglobin A1c of 6, LDL of 110 significantly improved from 172, CBC, CMP unremarkable.  Patient is here alone and describes memory difficulty, mild memory loss reported by patient mostly short-term, he has noted this in the last year, he states his wife is noticed to, he will see people that he is worked with for 30+ years but it takes him a while to recall the name, he was foggy several weeks ago, he sleeps upstairs now because of snoring, he tosses and turns, he does not feel rested during the day, he has history of migraines and takes sumatriptan hand, MMSE 30/30 animals 14.He is fatigued, he does "a lot", they have 3 small children, they are taking care of her mother with dementia, they care for her aunt and uncle who have dementia, they has a lot going on. He has always been bad about forgetting stuff. He has not had recent imaging. He retired as a Emergency planning/management officer, he used to be very confident about driving, he blanks with directions, he is frustrated, they were putting up some painting and and he put it up backwards which is not like him. His mother passed away at 60 and they found some signs of early dementia but she was not showing. Dad snores,  brother snores. He snores so much he has to sleep in another room. No changes in personality changes, no delusions, no hallucinations, falls asleep.  Reviewed notes, labs and imaging from outside physicians, which showed: see above  Review of Systems: Patient complains of symptoms per HPI as well as the following symptoms: fatigue, sleepiness, brain fog. Pertinent negatives and positives per HPI. All others negative.   Social History   Socioeconomic History  . Marital status: Married    Spouse name: Stanton Kidney  . Number of children: 3  . Years of education: Not on file  . Highest education level: Not on file  Occupational History    Employer: [employer]    Comment: Law Enforcement  Tobacco Use  . Smoking status: Never Smoker  . Smokeless tobacco: Never Used  Substance and Sexual Activity  . Alcohol use: Never    Alcohol/week: 0.0 standard drinks  . Drug use: Never  . Sexual activity: Yes    Partners: Female  Other Topics Concern  . Not on file  Social History Narrative   Married, 2 adopted children   Hydrographic surveyor   Regular exercise- no   Right handed   Social Determinants of Health   Financial Resource Strain:   . Difficulty of Paying Living Expenses: Not on file  Food Insecurity:   . Worried About Programme researcher, broadcasting/film/video in the Last Year: Not on file  .  Ran Out of Food in the Last Year: Not on file  Transportation Needs:   . Lack of Transportation (Medical): Not on file  . Lack of Transportation (Non-Medical): Not on file  Physical Activity:   . Days of Exercise per Week: Not on file  . Minutes of Exercise per Session: Not on file  Stress:   . Feeling of Stress : Not on file  Social Connections:   . Frequency of Communication with Friends and Family: Not on file  . Frequency of Social Gatherings with Friends and Family: Not on file  . Attends Religious Services: Not on file  . Active Member of Clubs or Organizations: Not on file  . Attends Tax inspector Meetings: Not on file  . Marital Status: Not on file  Intimate Partner Violence:   . Fear of Current or Ex-Partner: Not on file  . Emotionally Abused: Not on file  . Physically Abused: Not on file  . Sexually Abused: Not on file    Family History  Problem Relation Age of Onset  . Colon polyps Father   . Heart attack Mother   . Heart disease Mother   . Other Mother        coroner said there were signs of possible start of dementia   . Colon cancer Neg Hx   . Esophageal cancer Neg Hx   . Rectal cancer Neg Hx   . Stomach cancer Neg Hx     Past Medical History:  Diagnosis Date  . Allergic rhinitis, cause unspecified   . Allergy   . Hemorrhoids   . Migraine   . Plantar fasciitis   . Ulcer     Patient Active Problem List   Diagnosis Date Noted  . Snoring 11/18/2019  . Hemorrhoids 05/19/2013  . MIGRAINE WITHOUT AURA 05/03/2010  . ALLERGIC RHINITIS 05/26/2009  . GASTRIC ULCER, HX OF 05/26/2009  . SHINGLES, HX OF 05/26/2009    Past Surgical History:  Procedure Laterality Date  . WISDOM TOOTH EXTRACTION     with sedation    Current Outpatient Medications  Medication Sig Dispense Refill  . fluocinonide ointment (LIDEX) 0.05 % Apply topically 2 (two) times daily. 60 g 0  . SUMAtriptan Succinate Refill 6 MG/0.5ML SOCT Inject 1 Syringe into the skin as needed. Inject one syringe subcutaneously at onset of headache. May repeat in 2 hours prn x 1 5 Syringe 5   No current facility-administered medications for this visit.    Allergies as of 11/18/2019  . (No Known Allergies)    Vitals: BP 123/78 (BP Location: Left Arm, Patient Position: Sitting)   Pulse 85   Ht 6' 1.5" (1.867 m)   Wt 198 lb (89.8 kg)   BMI 25.77 kg/m  Last Weight:  Wt Readings from Last 1 Encounters:  11/18/19 198 lb (89.8 kg)   Last Height:   Ht Readings from Last 1 Encounters:  11/18/19 6' 1.5" (1.867 m)     Physical exam: Exam: Gen: NAD, conversant, well nourised, well  groomed                     CV: RRR, no MRG. No Carotid Bruits. No peripheral edema, warm, nontender Eyes: Conjunctivae clear without exudates or hemorrhage  Neuro: Detailed Neurologic Exam  Speech:    Speech is normal; fluent and spontaneous with normal comprehension.  Cognition:    The patient is oriented to person, place, and time;     recent and remote memory  intact;     language fluent;     normal attention, concentration,     fund of knowledge Cranial Nerves:    The pupils are equal, round, and reactive to light. The fundi are flat. Visual fields are full to finger confrontation. Extraocular movements are intact. Trigeminal sensation is intact and the muscles of mastication are normal. The face is symmetric. The palate elevates in the midline. Hearing intact. Voice is normal. Shoulder shrug is normal. The tongue has normal motion without fasciculations.   Coordination:    Normal finger to nose and heel to shin.    Gait:    Heel-toe and tandem gait are normal.   Motor Observation:    No asymmetry, no atrophy, and no involuntary movements noted. Tone:    Normal muscle tone.    Posture:    Posture is normal. normal erect    Strength:    Strength is V/V in the upper and lower limbs.      Sensation: intact to LT     Reflex Exam:  DTR's:    Deep tendon reflexes in the upper and lower extremities are normal bilaterally.   Toes:    The toes are downgoing bilaterally.   Clonus:    Clonus is absent.    Assessment/Plan:  [age] year old gentleman with memory complaints, excessive daytime fatigue, excessive daytime sleepiness, witnessed apneic events, snoring so loud he has to sleep in the other room, no family history of dementia, neurologic exam is normal.  Given his progressive short-term memory complaints I do recommend he have a work-up including MRI of the brain and blood work however I suspect this is entirely sleep apnea and I will send him to our sleep team for  evaluation.  Orders Placed This Encounter  Procedures  . MR BRAIN W WO CONTRAST  . B12 and Folate Panel  . Methylmalonic acid, serum  . Vitamin B1  . Vitamin B6  . Basic Metabolic Panel  . Ambulatory referral to Sleep Studies   No orders of the defined types were placed in this encounter.   Cc: Hannah Beat, MD,  Hannah Beat, MD  Naomie Dean, MD  [facility] [address] [place], [place] [address]  Phone [phone] Fax [phone]

## 2019-11-18 ENCOUNTER — Ambulatory Visit: Payer: 59 | Admitting: Neurology

## 2019-11-18 ENCOUNTER — Encounter: Payer: Self-pay | Admitting: Neurology

## 2019-11-18 ENCOUNTER — Other Ambulatory Visit: Payer: Self-pay

## 2019-11-18 VITALS — BP 123/78 | HR 85 | Ht 73.5 in | Wt 198.0 lb

## 2019-11-18 DIAGNOSIS — G4719 Other hypersomnia: Secondary | ICD-10-CM

## 2019-11-18 DIAGNOSIS — R4189 Other symptoms and signs involving cognitive functions and awareness: Secondary | ICD-10-CM

## 2019-11-18 DIAGNOSIS — R413 Other amnesia: Secondary | ICD-10-CM | POA: Diagnosis not present

## 2019-11-18 DIAGNOSIS — R0683 Snoring: Secondary | ICD-10-CM | POA: Insufficient documentation

## 2019-11-18 NOTE — Patient Instructions (Signed)
MRI of the brain Blood work today Sleep evaluation   Sleep Apnea Sleep apnea is a condition in which breathing pauses or becomes shallow during sleep. Episodes of sleep apnea usually last 10 seconds or longer, and they may occur as many as 20 times an hour. Sleep apnea disrupts your sleep and keeps your body from getting the rest that it needs. This condition can increase your risk of certain health problems, including:  Heart attack.  Stroke.  Obesity.  Diabetes.  Heart failure.  Irregular heartbeat. What are the causes? There are three kinds of sleep apnea:  Obstructive sleep apnea. This kind is caused by a blocked or collapsed airway.  Central sleep apnea. This kind happens when the part of the brain that controls breathing does not send the correct signals to the muscles that control breathing.  Mixed sleep apnea. This is a combination of obstructive and central sleep apnea. The most common cause of this condition is a collapsed or blocked airway. An airway can collapse or become blocked if:  Your throat muscles are abnormally relaxed.  Your tongue and tonsils are larger than normal.  You are overweight.  Your airway is smaller than normal. What increases the risk? You are more likely to develop this condition if you:  Are overweight.  Smoke.  Have a smaller than normal airway.  Are elderly.  Are male.  Drink alcohol.  Take sedatives or tranquilizers.  Have a family history of sleep apnea. What are the signs or symptoms? Symptoms of this condition include:  Trouble staying asleep.  Daytime sleepiness and tiredness.  Irritability.  Loud snoring.  Morning headaches.  Trouble concentrating.  Forgetfulness.  Decreased interest in sex.  Unexplained sleepiness.  Mood swings.  Personality changes.  Feelings of depression.  Waking up often during the night to urinate.  Dry mouth.  Sore throat. How is this diagnosed? This condition may  be diagnosed with:  A medical history.  A physical exam.  A series of tests that are done while you are sleeping (sleep study). These tests are usually done in a sleep lab, but they may also be done at home. How is this treated? Treatment for this condition aims to restore normal breathing and to ease symptoms during sleep. It may involve managing health issues that can affect breathing, such as high blood pressure or obesity. Treatment may include:  Sleeping on your side.  Using a decongestant if you have nasal congestion.  Avoiding the use of depressants, including alcohol, sedatives, and narcotics.  Losing weight if you are overweight.  Making changes to your diet.  Quitting smoking.  Using a device to open your airway while you sleep, such as: ? An oral appliance. This is a custom-made mouthpiece that shifts your lower jaw forward. ? A continuous positive airway pressure (CPAP) device. This device blows air through a mask when you breathe out (exhale). ? A nasal expiratory positive airway pressure (EPAP) device. This device has valves that you put into each nostril. ? A bi-level positive airway pressure (BPAP) device. This device blows air through a mask when you breathe in (inhale) and breathe out (exhale).  Having surgery if other treatments do not work. During surgery, excess tissue is removed to create a wider airway. It is important to get treatment for sleep apnea. Without treatment, this condition can lead to:  High blood pressure.  Coronary artery disease.  In men, an inability to achieve or maintain an erection (impotence).  Reduced thinking abilities.  Follow these instructions at home: Lifestyle  Make any lifestyle changes that your health care provider recommends.  Eat a healthy, well-balanced diet.  Take steps to lose weight if you are overweight.  Avoid using depressants, including alcohol, sedatives, and narcotics.  Do not use any products that  contain nicotine or tobacco, such as cigarettes, e-cigarettes, and chewing tobacco. If you need help quitting, ask your health care provider. General instructions  Take over-the-counter and prescription medicines only as told by your health care provider.  If you were given a device to open your airway while you sleep, use it only as told by your health care provider.  If you are having surgery, make sure to tell your health care provider you have sleep apnea. You may need to bring your device with you.  Keep all follow-up visits as told by your health care provider. This is important. Contact a health care provider if:  The device that you received to open your airway during sleep is uncomfortable or does not seem to be working.  Your symptoms do not improve.  Your symptoms get worse. Get help right away if:  You develop: ? Chest pain. ? Shortness of breath. ? Discomfort in your back, arms, or stomach.  You have: ? Trouble speaking. ? Weakness on one side of your body. ? Drooping in your face. These symptoms may represent a serious problem that is an emergency. Do not wait to see if the symptoms will go away. Get medical help right away. Call your local emergency services (911 in the [place] Do not drive yourself to the hospital. Summary  Sleep apnea is a condition in which breathing pauses or becomes shallow during sleep.  The most common cause is a collapsed or blocked airway.  The goal of treatment is to restore normal breathing and to ease symptoms during sleep. This information is not intended to replace advice given to you by your health care provider. Make sure you discuss any questions you have with your health care provider. Document Revised: 07/23/2018 Document Reviewed: 10/01/2017 Elsevier Patient Education  2020 [employer].

## 2019-11-19 ENCOUNTER — Telehealth: Payer: Self-pay | Admitting: Neurology

## 2019-11-19 NOTE — Telephone Encounter (Signed)
no to the covid questions MR Brain w/wo contrast Dr. Lucia Gaskins [facility] Auth: [facility] via [facility] website. They will reach out to the patient to schedule.

## 2019-11-24 LAB — METHYLMALONIC ACID, SERUM: Methylmalonic Acid: 181 nmol/L (ref 0–378)

## 2019-11-24 LAB — BASIC METABOLIC PANEL
BUN/Creatinine Ratio: 21 — ABNORMAL HIGH (ref 9–20)
BUN: 21 mg/dL (ref 6–24)
CO2: 23 mmol/L (ref 20–29)
Calcium: 9.5 mg/dL (ref 8.7–10.2)
Chloride: 107 mmol/L — ABNORMAL HIGH (ref 96–106)
Creatinine, Ser: 1.02 mg/dL (ref 0.76–1.27)
GFR calc Af Amer: 95 mL/min/{1.73_m2} (ref >59)
GFR calc non Af Amer: 82 mL/min/{1.73_m2} (ref >59)
Glucose: 117 mg/dL — ABNORMAL HIGH (ref 65–99)
Potassium: 4.3 mmol/L (ref 3.5–5.2)
Sodium: 141 mmol/L (ref 134–144)

## 2019-11-24 LAB — VITAMIN B1: Thiamine: 132.5 nmol/L (ref 66.5–200.0)

## 2019-11-24 LAB — VITAMIN B6: Vitamin B6: 7.8 ug/L (ref 5.3–46.7)

## 2019-11-24 LAB — B12 AND FOLATE PANEL
Folate: 11.3 ng/mL (ref >3.0)
Vitamin B-12: 473 pg/mL (ref 232–1245)

## 2019-12-02 ENCOUNTER — Ambulatory Visit (INDEPENDENT_AMBULATORY_CARE_PROVIDER_SITE_OTHER): Payer: 59

## 2019-12-02 ENCOUNTER — Other Ambulatory Visit: Payer: Self-pay

## 2019-12-02 DIAGNOSIS — G4719 Other hypersomnia: Secondary | ICD-10-CM | POA: Diagnosis not present

## 2019-12-02 DIAGNOSIS — R413 Other amnesia: Secondary | ICD-10-CM

## 2019-12-02 DIAGNOSIS — R4189 Other symptoms and signs involving cognitive functions and awareness: Secondary | ICD-10-CM | POA: Diagnosis not present

## 2019-12-02 MED ORDER — GADOBENATE DIMEGLUMINE 529 MG/ML IV SOLN
15.0000 mL | Freq: Once | INTRAVENOUS | Status: AC | PRN
  Administered 2019-12-02: 15 mL via INTRAVENOUS

## 2019-12-23 ENCOUNTER — Ambulatory Visit (INDEPENDENT_AMBULATORY_CARE_PROVIDER_SITE_OTHER): Payer: 59 | Admitting: Neurology

## 2019-12-23 ENCOUNTER — Encounter: Payer: Self-pay | Admitting: Neurology

## 2019-12-23 ENCOUNTER — Other Ambulatory Visit: Payer: Self-pay

## 2019-12-23 VITALS — BP 140/84 | HR 66 | Ht 73.0 in | Wt 200.0 lb

## 2019-12-23 DIAGNOSIS — R5382 Chronic fatigue, unspecified: Secondary | ICD-10-CM | POA: Diagnosis not present

## 2019-12-23 DIAGNOSIS — R0683 Snoring: Secondary | ICD-10-CM

## 2019-12-23 DIAGNOSIS — R413 Other amnesia: Secondary | ICD-10-CM | POA: Diagnosis not present

## 2019-12-23 DIAGNOSIS — G4719 Other hypersomnia: Secondary | ICD-10-CM

## 2019-12-23 DIAGNOSIS — G478 Other sleep disorders: Secondary | ICD-10-CM

## 2019-12-23 NOTE — Patient Instructions (Signed)

## 2019-12-23 NOTE — Progress Notes (Signed)
SLEEP MEDICINE CLINIC    Provider:  Melvyn Novas, MD  Primary Care Physician:  Hannah Beat, MD [address] [place] [address]     Referring Provider: Dr Lucia Gaskins, MD         Chief Complaint according to patient   Patient presents with:     New Patient (Initial Visit)     pt alone, rm 11. presents today for new concerns of ? OSA. he saw Dr Lucia Gaskins for 6 mths with steadily declining problems with memory, brain fog and concentration, as well as recall problems. MRI normal.  pt admits to snoring and still waking up tired after 9 hrs of sleep. never had a SS      HISTORY OF PRESENT ILLNESS:  Ian Golden is a 54 y.o. year old White or Caucasian male patient seen here as a referral on 12/23/2019.  Chief concern according to patient : I can't recall things I should be able to. I may be fatigued.     I have the pleasure of seeing Ian Golden in consultation -12-23-2019,  a right -handed White or Caucasian male with a possible sleep disorder.  He   has a past medical history of Allergic rhinitis, cause unspecified, Allergy, Hemorrhoids, Migraine, Plantar fasciitis, and Ulcer.    Sleep relevant medical history: Nocturia- none or once, Sleep walking in childhood, no Tonsillectomy, No TBI.  Family medical /sleep history: father ( 38 years old !)  and brother snore- loud !  Have hypersomnia. no other family member on CPAP with OSA, insomnia, sleep walkers.    Social history: Patient is a retired Nurse, adult, and has a Engineer, agricultural farm- and lives in a household with 5 persons. Family status is marred  with 3 children, 49,75 and 39 year old- adoptees. Helps care with aunt and uncle in-laws.   The patient currently works some part time.  Pets are present. Burros.  Tobacco use : never  ETOH use ; never ,  Caffeine intake in form of  Tea ( once a month ) or energy drinks. He would take red bull during a night shift , but this was 3 years ago  Regular exercise in form of: Working out.     Hobbies : farming      Sleep habits are as follows: busy lives- up at 6.30 Am to 9.30 PM- The patient's dinner time is between 4-5 PM.  The patient goes to bed at 9.30 PM and continues to sleep for 4-5 hours, goes back to sleep- , total sleep time 8 hours or more. AM.  The bedroom is  shared with his spouse and cool, quiet and dark.  The preferred sleep position is supine or on his sides, with the support of 2 pillows.  Dreams are reportedly infrequent.  6.45 AM is the usual rise time. The patient wakes up spontaneously.He reports not feeling refreshed or restored in AM, with symptoms such as dry mouth, some  morning headaches, and always with residual fatigue.  Naps are taken infrequently, lasting 15 minutes and are more refreshing than nocturnal sleep.    Review of Systems:  MMSE - Mini Mental State Exam 11/18/2019  Orientation to time 5  Orientation to Place 5  Registration 3  Attention/ Calculation 5  Recall 3  Language- name 2 objects 2  Language- repeat 1  Language- follow 3 step command 3  Language- read & follow direction 1  Write a sentence 1  Copy design  1  Total score 30    Out of a complete 14 system review, the patient complains of only the following symptoms, and all other reviewed systems are negative.:  Fatigue, sleepiness , loud snoring, fragmented sleep, snorting, gasping.    How likely are you to doze in the following situations: 0 = not likely, 1 = slight chance, 2 = moderate chance, 3 = high chance   Sitting and Reading? Watching Television? Sitting inactive in a public place (theater or meeting)? As a passenger in a car for an hour without a break? Lying down in the afternoon when circumstances permit? Sitting and talking to someone? Sitting quietly after lunch without alcohol? In a car, while stopped for a few minutes in traffic?   Total = 17/ 24 points - VERY HIGH   FSS endorsed at 51/ 63 points.- Very High  Memory loss, he reports no lower  exercise tolerance, but sudden fatigue once he stops. .    Social History   Socioeconomic History   Marital status: Married    Spouse name: Stanton KidneyDebra   Number of children: 3   Years of education: Not on file   Highest education level: Not on file  Occupational History    Employer: [employer]    Comment: Patent examinerLaw Enforcement  Tobacco Use   Smoking status: Never Smoker   Smokeless tobacco: Never Used  Substance and Sexual Activity   Alcohol use: Never    Alcohol/week: 0.0 standard drinks   Drug use: Never   Sexual activity: Yes    Partners: Female  Other Topics Concern   Not on file  Social History Narrative   Married, 2 adopted children   Hydrographic surveyorLaw Enforcement officer   Regular exercise- no   Right handed   Social Determinants of Health   Financial Resource Strain:    Difficulty of Paying Living Expenses: Not on file  Food Insecurity:    Worried About Programme researcher, broadcasting/film/videounning Out of Food in the Last Year: Not on file   [employer] Out of Food in the Last Year: Not on file  Transportation Needs:    Lack of Transportation (Medical): Not on file   Lack of Transportation (Non-Medical): Not on file  Physical Activity:    Days of Exercise per Week: Not on file   Minutes of Exercise per Session: Not on file  Stress:    Feeling of Stress : Not on file  Social Connections:    Frequency of Communication with Friends and Family: Not on file   Frequency of Social Gatherings with Friends and Family: Not on file   Attends Religious Services: Not on file   Active Member of Clubs or Organizations: Not on file   Attends BankerClub or Organization Meetings: Not on file   Marital Status: Not on file    Family History  Problem Relation Age of Onset   Colon polyps Father    Heart attack Mother    Heart disease Mother    Other Mother        coroner said there were signs of possible start of dementia    Colon cancer Neg Hx    Esophageal cancer Neg Hx    Rectal cancer Neg Hx    Stomach  cancer Neg Hx     Past Medical History:  Diagnosis Date   Allergic rhinitis, cause unspecified    Allergy    Hemorrhoids    Migraine    Plantar fasciitis    Ulcer     Past Surgical  History:  Procedure Laterality Date   WISDOM TOOTH EXTRACTION     with sedation     Current Outpatient Medications on File Prior to Visit  Medication Sig Dispense Refill   fluocinonide ointment (LIDEX) 0.05 % Apply topically 2 (two) times daily. 60 g 0   SUMAtriptan Succinate Refill 6 MG/0.5ML SOCT Inject 1 Syringe into the skin as needed. Inject one syringe subcutaneously at onset of headache. May repeat in 2 hours prn x 1 5 Syringe 5   No current facility-administered medications on file prior to visit.    No Known Allergies : Environmental allergies.   Physical exam:  Today's Vitals   12/23/19 1006  BP: 140/84  Pulse: 66  Weight: 200 lb (90.7 kg)  Height: 6\' 1"  (1.854 m)   Body mass index is 26.39 kg/m.   Wt Readings from Last 3 Encounters:  12/23/19 200 lb (90.7 kg)  11/18/19 198 lb (89.8 kg)  07/29/19 193 lb 8 oz (87.8 kg)     Ht Readings from Last 3 Encounters:  12/23/19 6\' 1"  (1.854 m)  11/18/19 6' 1.5" (1.867 m)  07/29/19 6' 1.5" (1.867 m)      General: The patient is awake, alert and appears not in acute distress. The patient is well groomed. Head: Normocephalic, atraumatic. Neck is supple. Mallampati 1 (!) ,  neck circumference: 16.25  inches . Nasal airflow restricted   Retrognathia is not seen.  Dental status: biological  Cardiovascular:  Regular rate and cardiac rhythm by pulse,  without distended neck veins. Respiratory: Lungs are clear to auscultation.  Skin:  Without evidence of ankle edema, or rash. Trunk: The patient's posture is erect.   Neurologic exam : The patient is awake and alert, oriented to place and time.   Memory subjective described as intact.  Attention span & concentration ability appears normal.  Speech is fluent,  without   dysarthria, dysphonia or aphasia.  Mood and affect are appropriate.   Cranial nerves: no loss of smell or taste reported  Pupils are equal and briskly reactive to light. Funduscopic exam deferred. .  Extraocular movements in vertical and horizontal planes were intact and without nystagmus. No Diplopia. Visual fields by finger perimetry are intact. Hearing was intact to soft voice and finger rubbing.  Facial sensation intact to fine touch.  Facial motor strength is symmetric and tongue and uvula move midline.  Neck ROM : rotation, tilt and flexion extension were normal for age and shoulder shrug was symmetrical.    Motor exam:  Symmetric bulk, tone and ROM.   Normal tone without cog wheeling, symmetric grip strength .   Sensory:  Fine touch and vibration were tested  and  normal.  Proprioception tested in the upper extremities was normal.   Coordination: Rapid alternating movements in the fingers/hands were of normal speed. No changes in penmanship  The Finger-to-nose maneuver was intact without evidence of ataxia, dysmetria or tremor.   Gait and station: Patient could rise unassisted from a seated position, walked without assistive device.  Stance is of normal width/ base .  Toe and heel walk were deferred.  Deep tendon reflexes: in the  upper and lower extremities are symmetric and trace only/ Babinski response was deferred .        After spending a total time of 45 minutes face to face and additional time for physical and neurologic examination, review of laboratory studies,  personal review of imaging studies, reports and results of other testing and review of  referral information / records as far as provided in visit, I have established the following assessments:  I could hear from Dr. Darreld Mclean note she has seen the patient on 18 November 2019 her main concern at the time was possible early onset Alzheimer or another memory disorder.  She had not had any acute events neither basal  brain injuries nor strokes in the past there were no sudden changes in medication no other medical diagnoses, he had been treated for hyperlipidemia his LDL was reduced, his CBC and CMP were unremarkable.  But the patient described the symptoms of fogginess and having trouble to recall the name that he should know or recall part of a conversation that has taken place the same day.  He does feel excessively fatigued and today's evaluation here shows that he is actually also excessively daytime sleepy.  The couple lives with their 3 young children and the wife's mother in the same household basically it is a multigenerational.  The patient has noted that her father and his brother were loud snores.  His father which remarkable age of 58.  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  There was no focal neurologic deficit noted and he actually has a slender neck wide open upper airway but he does have a very small nasal bridge and some nasal congestion may be seasonal is also present.  For this I will check him for the presence of sleep apnea and my goal is to do this by a home sleep test rather than an attended sleep study.  It would be easier for him to do this at home and I am not looking for abnormal sleep architecture restless legs or any nocturnal seizures that would justify a overnight stay.  There has been no history of cardiac arrhythmia which would have been another reason to order an in lab study.MRI was normal. EEG has not been ordered .     My Plan is to proceed with:  1) HST ordered, no in lab test needed.  2) will recommend MOCA and EEG to dr Lucia Gaskins.    I would like to thank Hannah Beat, MD and Hannah Beat, Md [address] [place],  [place] [address] for allowing me to meet with and to take care of this pleasant patient.     I plan to follow up either personally or through our NP  within 3 month if HST is positive. .   CC: I will share my notes with PCP.  Electronically signed by: Melvyn Novas, MD 12/23/2019 10:41 AM  [facility] Board certified by [employer] of Sleep Medicine and Diplomate of the [employer] of Sleep Medicine. Board certified In Neurology through the ABPN, Fellow of the [employer] of Neurology. Medical Director of [facility].

## 2020-01-18 ENCOUNTER — Ambulatory Visit (INDEPENDENT_AMBULATORY_CARE_PROVIDER_SITE_OTHER): Payer: 59 | Admitting: Neurology

## 2020-01-18 DIAGNOSIS — G4719 Other hypersomnia: Secondary | ICD-10-CM

## 2020-01-18 DIAGNOSIS — G4733 Obstructive sleep apnea (adult) (pediatric): Secondary | ICD-10-CM | POA: Diagnosis not present

## 2020-01-18 DIAGNOSIS — R5382 Chronic fatigue, unspecified: Secondary | ICD-10-CM

## 2020-01-18 DIAGNOSIS — R0683 Snoring: Secondary | ICD-10-CM

## 2020-01-18 DIAGNOSIS — R413 Other amnesia: Secondary | ICD-10-CM

## 2020-01-18 DIAGNOSIS — G478 Other sleep disorders: Secondary | ICD-10-CM

## 2020-01-26 NOTE — Progress Notes (Signed)
[name]  HOME SLEEP TEST (Watch PAT)  STUDY DATE: data loaded on 01-22-2020  DOB: 1965/06/16  MRN: [id number]  ORDERING CLINICIAN: Melvyn Novas, MD   REFERRING CLINICIAN: Naomie Dean, MD   CLINICAL INFORMATION/HISTORY:  Epworth sleepiness score: 17/24.  BMI: 26.6 kg/m  FINDINGS:   Total Record Time (hours, min): 7 h 58 min  Total Sleep Time (hours, min):  5 h 45 min   Percent REM (%):    19.85 %   Calculated pAHI (per hour):  9.2       REM pAHI:    4.4     NREM pAHI: 10.4 Supine AHI: 13.4   Oxygen Saturation (%) Mean: 94  Minimum oxygen saturation (%):         87   O2 Saturation Range (%): 87-98  O2Saturation (minutes) <=88%: 0.1 min   Pulse Mean (bpm):    54  Pulse Range (35-93)     Patient Information     First Name: Ian Last Name: Golden ID: [id number]  Birth Date: October 17, 2065 Age: 54 Gender: Male    BMI: 26.6 (W=200 lb, H=6' 1'')  Neck Circ.:  16 '' Epworth:  17/24                                       Dr. Mervyn Skeeters. Ahern   Sleep Study Information    Study Date: 01/18/20, S/H/A Version: [id number] / 4.2.1023 / 34  Histoy    Ian Golden was seen in consultation, requested by Dr Lucia Gaskins, on 12-23-2019. He is  a right -handed Caucasian male with a possible sleep disorder and a past medical history of Allergic rhinitis, cause unspecified, Migraine, Plantar fasciitis and Ulcer. Sleep relevant medical history: Nocturia- none or once, Sleep walking in childhood. Family medical /sleep history: father (1 years old!)  and brother snore- loud!  The patient is excessively daytime sleepy and reports exercise intolerance even after 9 hours of sleep time. Fatigue score was 51/63- points =severe.   Summary & Diagnosis:     This HST reveals only mild Sleep Apnea at an AHI of 9.2/h. NREM dominant (AHI 10.4/h) and without significant oxygen desaturations. Bradycardia was noted.   Recommendations:     It is difficult to imagine that this  mild degree of sleep apnea should account for the excessive fatigue and daytime sleepiness. The algorithm of this HST device estimated the sleep time just to be 5.5 hours.  Still,  apnea can be contributing to the symptoms and we can offer CPAP or dental device treatment for this constellation of mild apnea without hypoxia or REM sleep accentuation.   Interpreting Physician: Melvyn Novas, MD           Sleep Summary  Oxygen Saturation Statistics   Start Study Time: End Study Time: Total Recording Time:  9:17:55 PM 5:16:06 AM 7 h, 58 min  Total Sleep Time % REM of Sleep Time:  5 h, 35 min  19.9    Mean: 94 Minimum: 87 Maximum: 98  Mean of Desaturations Nadirs (%):   91  Oxygen Desat. %:  4-9 10-20 >20 Total  Events Number Total   17  1 94.4 5.6  0 0.0  18 100.0  Oxygen Saturation: <90 <=88 <85 <80 <70  Duration (minutes): Sleep % 0.1 0.0 0.1 0.0 0.0 0.0 0.0 0.0 0.0 0.0     Respiratory  Indices      Total Events REM NREM All Night  pRDI: pAHI 3%: ODI 4%: pAHIc 3%: % CSR: pAHI 4%:  61  51  18  18 0.0 28 5.3 4.4 0.9 1.8 12.4 10.4 3.8 3.6 11.0 9.2 3.2 3.2 5.0       Pulse Rate Statistics during Sleep (BPM)      Mean: 54 Minimum: 35 Maximum: 93     INTERPRETING PHYSICIAN:  Melvyn Novas, MD  [facility] certified by [employer] of Sleep Medicine and Diplomate of the [employer] of Sleep Medicine. Board certified In Neurology through the ABPN, Fellow of the [employer] of Neurology. Medical Director of [facility].

## 2020-01-27 ENCOUNTER — Encounter: Payer: Self-pay | Admitting: Family Medicine

## 2020-01-27 MED ORDER — SUMATRIPTAN SUCCINATE REFILL 6 MG/0.5ML ~~LOC~~ SOCT: 1.0000 | SUBCUTANEOUS | 1 refills | Status: DC | PRN

## 2020-01-29 DIAGNOSIS — R5382 Chronic fatigue, unspecified: Secondary | ICD-10-CM | POA: Insufficient documentation

## 2020-01-29 DIAGNOSIS — G478 Other sleep disorders: Secondary | ICD-10-CM | POA: Insufficient documentation

## 2020-01-29 DIAGNOSIS — R413 Other amnesia: Secondary | ICD-10-CM | POA: Insufficient documentation

## 2020-01-29 DIAGNOSIS — G4719 Other hypersomnia: Secondary | ICD-10-CM | POA: Insufficient documentation

## 2020-01-29 NOTE — Progress Notes (Signed)
  [name]  HOME SLEEP TEST (Watch PAT)  STUDY DATE: data loaded on 01-22-2020  DOB: 1965-09-12  MRN: [id number]  ORDERING CLINICIAN: Melvyn Novas, MD  REFERRING CLINICIAN: Naomie Dean, MD   CLINICAL INFORMATION/HISTORY:  Epworth sleepiness score: 17/24.  BMI: 26.6 kg/m  FINDINGS:   Total Record Time (hours, min): 7 h 58 min  Total Sleep Time (hours, min):  5 h 35 min   Percent REM (%):    19.85 %   Calculated pAHI (per hour):  9.2       REM pAHI:    4.4     NREM pAHI: 10.4 Supine AHI: 13.4  Oxygen Saturation (%) Mean: 94  Minimum oxygen saturation (%):         87   O2 Saturation Range (%): 87-98  O2Saturation (minutes) <=88%: 0.1 min   Pulse Mean (bpm):    54  Pulse Range (35-93)    Sleep Study Report  Patient Information    First Name: Ian Last Name: Golden ID: [id number] Birth Date: Jul 06, 2065 Age: 54 Gender: Male   BMI: 26.6 (W=200 lb, H=6' 1'') Neck Circ.:  16 '' Epworth:  17/24                                       Dr. Mervyn Skeeters. [facility]  Sleep Study Information   Study Date: 01/18/20, S/H/A Version: [id number] / 4.2.1023 / 63 Histoy   [name] was seen in consultation, requested by Dr Lucia Gaskins, on 12-23-2019. He is  a right -handed Caucasian male with a possible sleep disorder and a past medical history of Allergic rhinitis, cause unspecified, Migraine, Plantar fasciitis and Ulcer. Sleep relevant medical history: Nocturia- none or once, Sleep walking in childhood. Family medical /sleep history: father (6 years old!)  and brother snore- loud!  The patient is excessively daytime sleepy and reports exercise intolerance even after 9 hours of sleep time. Fatigue score was 51/63- points =severe.   Summary & Diagnosis:    This HST reveals only mild Sleep Apnea at an AHI of 9.2/h. NREM dominant (AHI 10.4/h) and without significant oxygen desaturations. Bradycardia was noted.   Recommendations:    It is difficult to imagine that  this mild degree of sleep apnea should account for the excessive fatigue and daytime sleepiness. The algorithm of this HST device estimated the sleep time just to be 5.5 hours.  Still,  apnea can be contributing to the symptoms and we can offer CPAP or dental device treatment for this constellation of mild apnea without hypoxia or REM sleep accentuation.   Interpreting Physician: Melvyn Novas, MD

## 2020-01-29 NOTE — Procedures (Signed)
[name]  HOME SLEEP TEST (Watch PAT)  STUDY DATE: data loaded on 01-22-2020  DOB: 1965/08/29  MRN: [id number]  ORDERING CLINICIAN: Melvyn Novas, MD   REFERRING CLINICIAN: Naomie Dean, MD   CLINICAL INFORMATION/HISTORY:  Epworth sleepiness score: 17/24.  BMI: 26.6 kg/m  FINDINGS:   Total Record Time (hours, min): 7 h 58 min  Total Sleep Time (hours, min):  5 h 45 min   Percent REM (%):    19.85 %   Calculated pAHI (per hour):  9.2       REM pAHI:    4.4     NREM pAHI: 10.4 Supine AHI: 13.4   Oxygen Saturation (%) Mean: 94  Minimum oxygen saturation (%):         87   O2 Saturation Range (%): 87-98  O2Saturation (minutes) <=88%: 0.1 min   Pulse Mean (bpm):    54  Pulse Range (35-93)     Patient Information     First Name: Ian Last Name: Golden ID: [id number]  Birth Date: Aug 04, 2065 Age: 54 Gender: Male    BMI: 26.6 (W=200 lb, H=6' 1'')  Neck Circ.:  16 '' Epworth:  17/24                                       Dr. Mervyn Skeeters. [facility]   Sleep Study Information    Study Date: 01/18/20, S/H/A Version: 003.003.003.003 / 4.2.1023 / 48  Histoy    Ian Golden was seen in consultation, requested by Dr Lucia Gaskins, on 12-23-2019. He is  a right -handed Caucasian male with a possible sleep disorder and a past medical history of Allergic rhinitis, cause unspecified, Migraine, Plantar fasciitis and Ulcer. Sleep relevant medical history: Nocturia- none or once, Sleep walking in childhood. Family medical /sleep history: father (25 years old!)  and brother snore- loud!  The patient is excessively daytime sleepy and reports exercise intolerance even after 9 hours of sleep time. Fatigue score was 51/63- points =severe.   Summary & Diagnosis:     This HST reveals only mild Sleep Apnea at an AHI of 9.2/h. NREM dominant (AHI 10.4/h) and without significant oxygen desaturations. Bradycardia was noted.   Recommendations:     It is difficult to imagine that this mild  degree of sleep apnea should account for the excessive fatigue and daytime sleepiness. The algorithm of this HST device estimated the sleep time just to be 5.5 hours.  Still,  apnea can be contributing to the symptoms and we can offer CPAP or dental device treatment for this constellation of mild apnea without hypoxia or REM sleep accentuation.   Interpreting Physician: Melvyn Novas, MD           Sleep Summary  Oxygen Saturation Statistics   Start Study Time: End Study Time: Total Recording Time:  9:17:55 PM 5:16:06 AM 7 h, 58 min  Total Sleep Time % REM of Sleep Time:  5 h, 35 min  19.9    Mean: 94 Minimum: 87 Maximum: 98  Mean of Desaturations Nadirs (%):   91  Oxygen Desat. %:  4-9 10-20 >20 Total  Events Number Total   17  1 94.4 5.6  0 0.0  18 100.0  Oxygen Saturation: <90 <=88 <85 <80 <70  Duration (minutes): Sleep % 0.1 0.0 0.1 0.0 0.0 0.0 0.0 0.0 0.0 0.0     Respiratory Indices  Total Events REM NREM All Night  pRDI: pAHI 3%: ODI 4%: pAHIc 3%: % CSR: pAHI 4%:  61  51  18  18 0.0 28 5.3 4.4 0.9 1.8 12.4 10.4 3.8 3.6 11.0 9.2 3.2 3.2 5.0       Pulse Rate Statistics during Sleep (BPM)

## 2020-02-01 ENCOUNTER — Telehealth: Payer: Self-pay | Admitting: Neurology

## 2020-02-01 NOTE — Telephone Encounter (Signed)
-----   Message from Melvyn Novas, MD sent at 01/29/2020  2:42 PM EST -----  [name]  HOME SLEEP TEST (Watch PAT)  STUDY DATE: data loaded on 01-22-2020  DOB: 03-10-1965  MRN: [id number]  ORDERING CLINICIAN: Melvyn Novas, MD   REFERRING CLINICIAN: Naomie Dean, MD   CLINICAL INFORMATION/HISTORY:  Epworth sleepiness score: 17/24.  BMI: 26.6 kg/m  FINDINGS:   Total Record Time (hours, min): 7 h 58 min  Total Sleep Time (hours, min):  5 h 35 min   Percent REM (%):    19.85 %   Calculated pAHI (per hour):  9.2       REM pAHI:    4.4     NREM pAHI: 10.4 Supine AHI: 13.4   Oxygen Saturation (%) Mean: 94  Minimum oxygen saturation (%):         87   O2 Saturation Range (%): 87-98  O2Saturation (minutes) <=88%: 0.1 min   Pulse Mean (bpm):    54  Pulse Range (35-93)    Sleep Study Report  Patient Information    First Name: Ian Last Name: Golden ID: [id number] Birth Date: August 16, 2065 Age: 54 Gender: Male   BMI: 26.6 (W=200 lb, H=6' 1'') Neck Circ.:  16 '' Epworth:  17/24                                       Dr. Mervyn Skeeters. [facility]  Sleep Study Information   Study Date: 01/18/20, S/H/A Version: 003.003.003.003 / 4.2.1023 / 64 Histoy   ELEUTERIO DOLLAR was seen in consultation, requested by Dr Lucia Gaskins, on 12-23-2019. He is  a right -handed Caucasian male with a possible sleep disorder and a past medical history of Allergic rhinitis, cause unspecified, Migraine, Plantar fasciitis and Ulcer. Sleep relevant medical history: Nocturia- none or once, Sleep walking in childhood. Family medical /sleep history: father (58 years old!)  and brother snore- loud!  The patient is excessively daytime sleepy and reports exercise intolerance even after 9 hours of sleep time. Fatigue score was 51/63- points =severe.   Summary & Diagnosis:    This HST reveals only mild Sleep Apnea at an AHI of 9.2/h. NREM dominant (AHI 10.4/h) and without significant oxygen desaturations.  Bradycardia was noted.   Recommendations:    It is difficult to imagine that this mild degree of sleep apnea should account for the excessive fatigue and daytime sleepiness. The algorithm of this HST device estimated the sleep time just to be 5.5 hours.  Still,  apnea can be contributing to the symptoms and we can offer CPAP or dental device treatment for this constellation of mild apnea without hypoxia or REM sleep accentuation.   Interpreting Physician: Melvyn Novas, MD

## 2020-02-01 NOTE — Telephone Encounter (Signed)
I called pt. I advised pt that Dr. Vickey Huger reviewed their sleep study results and found that pt has mild sleep apnea. Dr. Vickey Huger recommends that pt starts auto CPAP or a dental device for treatment of mild sleep apnea. I reviewed what a auto CPAP and dental device is. Informed the process that would take place. At this time the patient would like to think on how to pursue forward. Pt will contact us back with what he would like to do.

## 2020-05-04 NOTE — Telephone Encounter (Signed)
Amy,   Is there any way that you could sign an FL-2 for [employer]?  It is extremely easy.  He only takes Aricept, Flomax, and ASA 81 mg.  Lupita Leash can probably fill out almost all of it.  If you don't feel comfortable, then I can understand.

## 2020-05-17 ENCOUNTER — Telehealth: Payer: Self-pay | Admitting: Family Medicine

## 2020-06-10 NOTE — Telephone Encounter (Signed)
error 

## 2021-01-10 ENCOUNTER — Encounter: Payer: Self-pay | Admitting: Family Medicine

## 2021-01-10 MED ORDER — SUMATRIPTAN SUCCINATE REFILL 6 MG/0.5ML ~~LOC~~ SOCT: 1.0000 | SUBCUTANEOUS | 0 refills | Status: DC | PRN

## 2021-01-10 NOTE — Telephone Encounter (Signed)
Called Ian Golden and spoke to Ian Golden and got him scheduled for 12/21 @220 

## 2021-01-10 NOTE — Telephone Encounter (Signed)
Last office visit 07/29/2019 for CPE.  Last refilled 01/27/2020 for 2.5 ml with 1 refill.  No future appointments.  Please call and schedule CPE with Dr. Patsy Lager with fasting labs prior.

## 2021-01-13 ENCOUNTER — Encounter: Payer: Self-pay | Admitting: Family Medicine

## 2021-01-27 ENCOUNTER — Telehealth: Payer: Self-pay | Admitting: *Deleted

## 2021-01-27 NOTE — Telephone Encounter (Signed)
Please place future orders for lab appt.  

## 2021-01-27 NOTE — Telephone Encounter (Signed)
FLP, Z13.220 (screening for high cholesterol) Cbc with diff, HFP, BMET: fatigue, other Hemoglobin a1c: screening for diabetes PSA: Total with reflex to free: Z12.5 screening for prostate cancer Hepatitis C: Z11.59 screening for hepatitis c

## 2021-01-30 ENCOUNTER — Other Ambulatory Visit: Payer: Self-pay | Admitting: Family Medicine

## 2021-01-30 ENCOUNTER — Other Ambulatory Visit: Payer: Self-pay

## 2021-01-30 ENCOUNTER — Other Ambulatory Visit (INDEPENDENT_AMBULATORY_CARE_PROVIDER_SITE_OTHER): Payer: 59

## 2021-01-30 DIAGNOSIS — Z1159 Encounter for screening for other viral diseases: Secondary | ICD-10-CM | POA: Diagnosis not present

## 2021-01-30 DIAGNOSIS — Z131 Encounter for screening for diabetes mellitus: Secondary | ICD-10-CM | POA: Diagnosis not present

## 2021-01-30 DIAGNOSIS — Z13220 Encounter for screening for lipoid disorders: Secondary | ICD-10-CM

## 2021-01-30 DIAGNOSIS — R5383 Other fatigue: Secondary | ICD-10-CM

## 2021-01-30 DIAGNOSIS — Z125 Encounter for screening for malignant neoplasm of prostate: Secondary | ICD-10-CM

## 2021-01-30 LAB — BASIC METABOLIC PANEL
BUN: 23 mg/dL (ref 6–23)
CO2: 29 mEq/L (ref 19–32)
Calcium: 9.5 mg/dL (ref 8.4–10.5)
Chloride: 106 mEq/L (ref 96–112)
Creatinine, Ser: 0.99 mg/dL (ref 0.40–1.50)
GFR: 85.26 mL/min (ref >60.00)
Glucose, Bld: 91 mg/dL (ref 70–99)
Potassium: 4.4 mEq/L (ref 3.5–5.1)
Sodium: 141 mEq/L (ref 135–145)

## 2021-01-30 LAB — HEPATIC FUNCTION PANEL
ALT: 20 U/L (ref 0–53)
AST: 19 U/L (ref 0–37)
Albumin: 4.1 g/dL (ref 3.5–5.2)
Alkaline Phosphatase: 82 U/L (ref 39–117)
Bilirubin, Direct: 0.1 mg/dL (ref 0.0–0.3)
Total Bilirubin: 0.4 mg/dL (ref 0.2–1.2)
Total Protein: 6.6 g/dL (ref 6.0–8.3)

## 2021-01-30 LAB — CBC WITH DIFFERENTIAL/PLATELET
Basophils Absolute: 0 10*3/uL (ref 0.0–0.1)
Basophils Relative: 0.8 % (ref 0.0–3.0)
Eosinophils Absolute: 0.4 10*3/uL (ref 0.0–0.7)
Eosinophils Relative: 8.5 % — ABNORMAL HIGH (ref 0.0–5.0)
HCT: 43.8 % (ref 39.0–52.0)
Hemoglobin: 14.8 g/dL (ref 13.0–17.0)
Lymphocytes Relative: 26 % (ref 12.0–46.0)
Lymphs Abs: 1.3 10*3/uL (ref 0.7–4.0)
MCHC: 33.8 g/dL (ref 30.0–36.0)
MCV: 88.2 fl (ref 78.0–100.0)
Monocytes Absolute: 0.4 10*3/uL (ref 0.1–1.0)
Monocytes Relative: 8.1 % (ref 3.0–12.0)
Neutro Abs: 2.9 10*3/uL (ref 1.4–7.7)
Neutrophils Relative %: 56.6 % (ref 43.0–77.0)
Platelets: 195 10*3/uL (ref 150.0–400.0)
RBC: 4.96 Mil/uL (ref 4.22–5.81)
RDW: 13.2 % (ref 11.5–15.5)
WBC: 5.1 10*3/uL (ref 4.0–10.5)

## 2021-01-30 LAB — LIPID PANEL
Cholesterol: 216 mg/dL — ABNORMAL HIGH (ref 0–200)
HDL: 40.1 mg/dL (ref >39.00)
LDL Cholesterol: 153 mg/dL — ABNORMAL HIGH (ref 0–99)
NonHDL: 176.15
Total CHOL/HDL Ratio: 5
Triglycerides: 118 mg/dL (ref 0.0–149.0)
VLDL: 23.6 mg/dL (ref 0.0–40.0)

## 2021-01-30 LAB — HEMOGLOBIN A1C: Hgb A1c MFr Bld: 5.9 % (ref 4.6–6.5)

## 2021-01-31 LAB — PSA, TOTAL WITH REFLEX TO PSA, FREE: PSA, Total: 0.5 ng/mL (ref <=4.0)

## 2021-01-31 LAB — HEPATITIS C ANTIBODY
Hepatitis C Ab: NONREACTIVE
SIGNAL TO CUT-OFF: 0.02 (ref <1.00)

## 2021-02-06 ENCOUNTER — Encounter: Payer: Self-pay | Admitting: Family Medicine

## 2021-02-06 ENCOUNTER — Other Ambulatory Visit: Payer: Self-pay

## 2021-02-06 ENCOUNTER — Ambulatory Visit (INDEPENDENT_AMBULATORY_CARE_PROVIDER_SITE_OTHER): Payer: 59 | Admitting: Family Medicine

## 2021-02-06 VITALS — BP 130/90 | HR 72 | Temp 98.1°F | Ht 73.0 in | Wt 198.0 lb

## 2021-02-06 DIAGNOSIS — Z0000 Encounter for general adult medical examination without abnormal findings: Secondary | ICD-10-CM | POA: Diagnosis not present

## 2021-02-06 NOTE — Progress Notes (Signed)
Sherrise Liberto T. Ayson Cherubini, MD, CAQ Sports Medicine [facility] [address] [place], [address]  Phone: [phone]   FAX: [phone]  Ian Golden - 56 y.o. male   MRN [id number]   Date of Birth: [birth year]  Date: 02/06/2021   PCP: Hannah Beat, MD   Referral: Hannah Beat, MD  Chief Complaint  Patient presents with   Annual Exam    This visit occurred during the SARS-CoV-2 public health emergency.  Safety protocols were in place, including screening questions prior to the visit, additional usage of staff PPE, and extensive cleaning of exam room while observing appropriate contact time as indicated for disinfecting solutions.   Patient Care Team: Hannah Beat, MD as PCP - General Subjective:   Ian Golden is a 55 y.o. pleasant patient who presents with the following:  Preventative Health Maintenance Visit:  Health Maintenance Summary Reviewed and updated, unless pt declines services.  Tobacco History Reviewed. 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 He has been stressed.  His uncle passed away earlier in the year, and Ian Golden had a long history of worsening dementia, and think was his primary caregiver. His aunt now is also in poor health.  His father is 69 and still lives independently and is in basically good health.   Health Maintenance  Topic Date Due   Pneumococcal Vaccine 109-35 Years old (1 - PCV) Never done   COVID-19 Vaccine (4 - Booster for Pfizer series) 04/04/2020   COLONOSCOPY (Pts 45-73yrs Insurance coverage will need to be confirmed)  03/27/2025   TETANUS/TDAP  07/28/2029   INFLUENZA VACCINE  Completed   Hepatitis C Screening  Completed   HIV Screening  Completed   Zoster Vaccines- Shingrix  Completed   HPV VACCINES   Aged Out   Immunization History  Administered Date(s) Administered   Influenza Inj Mdck Quad Pf 11/17/2020   Influenza,inj,Quad PF,6+ Mos 01/02/2017, 11/07/2017   PFIZER(Purple Top)SARS-COV-2 Vaccination 03/26/2019, 04/16/2019, 02/08/2020   Td 07/29/2019   Tdap 04/21/2008   Zoster Recombinat (Shingrix) 03/03/2019, 08/20/2019   Patient Active Problem List   Diagnosis Date Noted   Hemorrhoids 05/19/2013   MIGRAINE WITHOUT AURA 05/03/2010   ALLERGIC RHINITIS 05/26/2009   GASTRIC ULCER, HX OF 05/26/2009    Past Medical History:  Diagnosis Date   Allergic rhinitis, cause unspecified    Hemorrhoids    Migraine    Ulcer     Past Surgical History:  Procedure Laterality Date   WISDOM TOOTH EXTRACTION     with sedation    Family History  Problem Relation Age of Onset   Colon polyps Father    Heart attack Mother    Heart disease Mother    Other Mother        coroner said there were signs of possible start of dementia    Colon cancer Neg Hx    Esophageal cancer Neg Hx    Rectal cancer Neg Hx    Stomach cancer Neg Hx     Past Medical History, Surgical History, Social History, Family History, Problem List, Medications, and Allergies have been reviewed and updated if relevant.  Review of Systems: Pertinent positives are listed above.  Otherwise, a full 14 point review of systems has been done in full and  it is negative except where it is noted positive.  Objective:   BP 130/90    Pulse 72    Temp 98.1 F (36.7 C) (Temporal)    Ht 6\' 1"  (1.854 m)    Wt 198 lb (89.8 kg)    SpO2 99%    BMI 26.12 kg/m  Ideal Body Weight: Weight in (lb) to have BMI = 25: 189.1  Ideal Body Weight: Weight in (lb) to have BMI = 25: 189.1 No results found. Depression screen [facility] 2/9 02/06/2021 07/29/2019 12/17/2016  Decreased Interest 0 0 0  Down, Depressed, Hopeless 0 0 0  PHQ - 2 Score 0 0 0     GEN: well developed, well nourished, no acute distress Eyes: conjunctiva and lids normal, PERRLA,  EOMI ENT: TM clear, nares clear, oral exam WNL Neck: supple, no lymphadenopathy, no thyromegaly, no JVD Pulm: clear to auscultation and percussion, respiratory effort normal CV: regular rate and rhythm, S1-S2, no murmur, rub or gallop, no bruits, peripheral pulses normal and symmetric, no cyanosis, clubbing, edema or varicosities GI: soft, non-tender; no hepatosplenomegaly, masses; active bowel sounds all quadrants GU: deferred Lymph: no cervical, axillary or inguinal adenopathy MSK: gait normal, muscle tone and strength WNL, no joint swelling, effusions, discoloration, crepitus  SKIN: clear, good turgor, color WNL, no rashes, lesions, or ulcerations Neuro: normal mental status, normal strength, sensation, and motion Psych: alert; oriented to person, place and time, normally interactive and not anxious or depressed in appearance.  All labs reviewed with patient. Results for orders placed or performed in visit on 01/30/21  Hepatitis C antibody  Result Value Ref Range   Hepatitis C Ab NON-REACTIVE NON-REACTIVE   SIGNAL TO CUT-OFF 0.02 <1.00  Hemoglobin A1c  Result Value Ref Range   Hgb A1c MFr Bld 5.9 4.6 - 6.5 %  CBC with Differential/Platelet  Result Value Ref Range   WBC 5.1 4.0 - 10.5 K/uL   RBC 4.96 4.22 - 5.81 Mil/uL   Hemoglobin 14.8 13.0 - 17.0 g/dL   HCT 14/12/22 23.5 - 57.3 %   MCV 88.2 78.0 - 100.0 fl   MCHC 33.8 30.0 - 36.0 g/dL   RDW 22.0 25.4 - 27.0 %   Platelets 195.0 150.0 - 400.0 K/uL   Neutrophils Relative % 56.6 43.0 - 77.0 %   Lymphocytes Relative 26.0 12.0 - 46.0 %   Monocytes Relative 8.1 3.0 - 12.0 %   Eosinophils Relative 8.5 (H) 0.0 - 5.0 %   Basophils Relative 0.8 0.0 - 3.0 %   Neutro Abs 2.9 1.4 - 7.7 K/uL   Lymphs Abs 1.3 0.7 - 4.0 K/uL   Monocytes Absolute 0.4 0.1 - 1.0 K/uL   Eosinophils Absolute 0.4 0.0 - 0.7 K/uL   Basophils Absolute 0.0 0.0 - 0.1 K/uL  Basic metabolic panel  Result Value Ref Range   Sodium 141 135 - 145 mEq/L   Potassium 4.4 3.5  - 5.1 mEq/L   Chloride 106 96 - 112 mEq/L   CO2 29 19 - 32 mEq/L   Glucose, Bld 91 70 - 99 mg/dL   BUN 23 6 - 23 mg/dL   Creatinine, Ser 62.3 0.40 - 1.50 mg/dL   GFR 7.62 83.15 mL/min   Calcium 9.5 8.4 - 10.5 mg/dL  Hepatic function panel  Result Value Ref Range   Total Bilirubin 0.4 0.2 - 1.2 mg/dL   Bilirubin, Direct 0.1 0.0 - 0.3 mg/dL   Alkaline Phosphatase 82 39 - 117 U/L   AST 19 0 -  37 U/L   ALT 20 0 - 53 U/L   Total Protein 6.6 6.0 - 8.3 g/dL   Albumin 4.1 3.5 - 5.2 g/dL  Lipid panel  Result Value Ref Range   Cholesterol 216 (H) 0 - 200 mg/dL   Triglycerides 119.1 0.0 - 149.0 mg/dL   HDL 47.82 >95.62 mg/dL   VLDL 13.0 0.0 - 86.5 mg/dL   LDL Cholesterol 784 (H) 0 - 99 mg/dL   Total CHOL/HDL Ratio 5    NonHDL 176.15   PSA, Total with Reflex to PSA, Free  Result Value Ref Range   PSA, Total 0.5 < OR = 4.0 ng/mL    Assessment and Plan:     ICD-10-CM   1. Healthcare maintenance  Z00.00      He is basically doing really well.  His cholesterol is increased quite a bit this year, and he has not been eating as well with his need for taking care of multiple other family members.  Otherwise, he has been doing quite well, and has done a great job with weight maintenance, and lives a general healthy lifestyle.  Health Maintenance Exam: The patient's preventative maintenance and recommended screening tests for an annual wellness exam were reviewed in full today. Brought up to date unless services declined.  Counselled on the importance of diet, exercise, and its role in overall health and mortality. The patient's FH and SH was reviewed, including their home life, tobacco status, and drug and alcohol status.  Follow-up in 1 year for physical exam or additional follow-up below.  Follow-up: No follow-ups on file. Or follow-up in 1 year if not noted.  No orders of the defined types were placed in this encounter.  There are no discontinued medications. No orders of the  defined types were placed in this encounter.   Signed,  Elpidio Galea. Byrd Rushlow, MD   Allergies as of 02/06/2021   No Known Allergies      Medication List        Accurate as of February 06, 2021  4:22 PM. If you have any questions, ask your nurse or doctor.          fluocinonide ointment 0.05 % Commonly known as: LIDEX Apply topically 2 (two) times daily.   SUMAtriptan Succinate Refill 6 MG/0.5ML Soct Inject 1 Syringe into the skin as needed. Inject one syringe subcutaneously at onset of headache. May repeat in 2 hours prn x 1

## 2021-02-08 ENCOUNTER — Encounter: Payer: 59 | Admitting: Family Medicine

## 2021-05-21 ENCOUNTER — Inpatient Hospital Stay (HOSPITAL_COMMUNITY)
Admission: EM | Admit: 2021-05-21 | Discharge: 2021-05-23 | DRG: 042 | Disposition: A | Discharge status: Home / Self Care | Payer: 59 | Attending: Internal Medicine | Admitting: Internal Medicine

## 2021-05-21 ENCOUNTER — Emergency Department (HOSPITAL_COMMUNITY): Payer: 59

## 2021-05-21 ENCOUNTER — Inpatient Hospital Stay (HOSPITAL_COMMUNITY): Payer: 59

## 2021-05-21 ENCOUNTER — Other Ambulatory Visit: Payer: Self-pay

## 2021-05-21 ENCOUNTER — Observation Stay (HOSPITAL_COMMUNITY): Payer: 59

## 2021-05-21 ENCOUNTER — Encounter (HOSPITAL_COMMUNITY): Payer: Self-pay

## 2021-05-21 DIAGNOSIS — Z8249 Family history of ischemic heart disease and other diseases of the circulatory system: Secondary | ICD-10-CM | POA: Diagnosis not present

## 2021-05-21 DIAGNOSIS — E785 Hyperlipidemia, unspecified: Secondary | ICD-10-CM | POA: Diagnosis present

## 2021-05-21 DIAGNOSIS — I10 Essential (primary) hypertension: Secondary | ICD-10-CM | POA: Diagnosis present

## 2021-05-21 DIAGNOSIS — I340 Nonrheumatic mitral (valve) insufficiency: Secondary | ICD-10-CM | POA: Diagnosis not present

## 2021-05-21 DIAGNOSIS — I63443 Cerebral infarction due to embolism of bilateral cerebellar arteries: Principal | ICD-10-CM | POA: Diagnosis present

## 2021-05-21 DIAGNOSIS — G454 Transient global amnesia: Secondary | ICD-10-CM | POA: Diagnosis present

## 2021-05-21 DIAGNOSIS — R519 Headache, unspecified: Secondary | ICD-10-CM | POA: Diagnosis present

## 2021-05-21 DIAGNOSIS — G43909 Migraine, unspecified, not intractable, without status migrainosus: Secondary | ICD-10-CM | POA: Diagnosis present

## 2021-05-21 DIAGNOSIS — Z79899 Other long term (current) drug therapy: Secondary | ICD-10-CM | POA: Diagnosis not present

## 2021-05-21 DIAGNOSIS — R29700 NIHSS score 0: Secondary | ICD-10-CM | POA: Diagnosis present

## 2021-05-21 DIAGNOSIS — J302 Other seasonal allergic rhinitis: Secondary | ICD-10-CM | POA: Diagnosis present

## 2021-05-21 DIAGNOSIS — I160 Hypertensive urgency: Secondary | ICD-10-CM | POA: Diagnosis present

## 2021-05-21 DIAGNOSIS — I6389 Other cerebral infarction: Secondary | ICD-10-CM | POA: Diagnosis not present

## 2021-05-21 DIAGNOSIS — I639 Cerebral infarction, unspecified: Secondary | ICD-10-CM | POA: Diagnosis present

## 2021-05-21 DIAGNOSIS — Z8673 Personal history of transient ischemic attack (TIA), and cerebral infarction without residual deficits: Secondary | ICD-10-CM | POA: Diagnosis not present

## 2021-05-21 HISTORY — DX: Hyperlipidemia, unspecified: E78.5

## 2021-05-21 LAB — COMPREHENSIVE METABOLIC PANEL
ALT: 22 U/L (ref 0–44)
AST: 25 U/L (ref 15–41)
Albumin: 3.7 g/dL (ref 3.5–5.0)
Alkaline Phosphatase: 75 U/L (ref 38–126)
Anion gap: 5 (ref 5–15)
BUN: 19 mg/dL (ref 6–20)
CO2: 25 mmol/L (ref 22–32)
Calcium: 9.1 mg/dL (ref 8.9–10.3)
Chloride: 111 mmol/L (ref 98–111)
Creatinine, Ser: 0.99 mg/dL (ref 0.61–1.24)
GFR, Estimated: >60 mL/min (ref >60)
Glucose, Bld: 94 mg/dL (ref 70–99)
Potassium: 4.1 mmol/L (ref 3.5–5.1)
Sodium: 141 mmol/L (ref 135–145)
Total Bilirubin: 0.3 mg/dL (ref 0.3–1.2)
Total Protein: 6.4 g/dL — ABNORMAL LOW (ref 6.5–8.1)

## 2021-05-21 LAB — I-STAT CHEM 8, ED
BUN: 22 mg/dL — ABNORMAL HIGH (ref 6–20)
Calcium, Ion: 1.18 mmol/L (ref 1.15–1.40)
Chloride: 107 mmol/L (ref 98–111)
Creatinine, Ser: 0.9 mg/dL (ref 0.61–1.24)
Glucose, Bld: 91 mg/dL (ref 70–99)
HCT: 43 % (ref 39.0–52.0)
Hemoglobin: 14.6 g/dL (ref 13.0–17.0)
Potassium: 4 mmol/L (ref 3.5–5.1)
Sodium: 142 mmol/L (ref 135–145)
TCO2: 24 mmol/L (ref 22–32)

## 2021-05-21 LAB — CBC
HCT: 43.3 % (ref 39.0–52.0)
Hemoglobin: 14.9 g/dL (ref 13.0–17.0)
MCH: 30.5 pg (ref 26.0–34.0)
MCHC: 34.4 g/dL (ref 30.0–36.0)
MCV: 88.5 fL (ref 80.0–100.0)
Platelets: 201 10*3/uL (ref 150–400)
RBC: 4.89 MIL/uL (ref 4.22–5.81)
RDW: 12.4 % (ref 11.5–15.5)
WBC: 6.1 10*3/uL (ref 4.0–10.5)
nRBC: 0 % (ref 0.0–0.2)

## 2021-05-21 LAB — HEMOGLOBIN A1C
Hgb A1c MFr Bld: 5.6 % (ref 4.8–5.6)
Mean Plasma Glucose: 114.02 mg/dL

## 2021-05-21 LAB — DIFFERENTIAL
Abs Immature Granulocytes: 0.02 10*3/uL (ref 0.00–0.07)
Basophils Absolute: 0 10*3/uL (ref 0.0–0.1)
Basophils Relative: 1 %
Eosinophils Absolute: 0.6 10*3/uL — ABNORMAL HIGH (ref 0.0–0.5)
Eosinophils Relative: 10 %
Immature Granulocytes: 0 %
Lymphocytes Relative: 26 %
Lymphs Abs: 1.6 10*3/uL (ref 0.7–4.0)
Monocytes Absolute: 0.5 10*3/uL (ref 0.1–1.0)
Monocytes Relative: 7 %
Neutro Abs: 3.4 10*3/uL (ref 1.7–7.7)
Neutrophils Relative %: 56 %

## 2021-05-21 LAB — APTT: aPTT: 26 seconds (ref 24–36)

## 2021-05-21 LAB — LIPID PANEL
Cholesterol: 200 mg/dL (ref 0–200)
HDL: 35 mg/dL — ABNORMAL LOW (ref >40)
LDL Cholesterol: 149 mg/dL — ABNORMAL HIGH (ref 0–99)
Total CHOL/HDL Ratio: 5.7 RATIO
Triglycerides: 78 mg/dL (ref <150)
VLDL: 16 mg/dL (ref 0–40)

## 2021-05-21 LAB — LDL CHOLESTEROL, DIRECT: Direct LDL: 146.8 mg/dL — ABNORMAL HIGH (ref 0–99)

## 2021-05-21 LAB — PROTIME-INR
INR: 1 (ref 0.8–1.2)
Prothrombin Time: 13.5 seconds (ref 11.4–15.2)

## 2021-05-21 LAB — HIV ANTIBODY (ROUTINE TESTING W REFLEX): HIV Screen 4th Generation wRfx: NONREACTIVE

## 2021-05-21 LAB — CBG MONITORING, ED: Glucose-Capillary: 103 mg/dL — ABNORMAL HIGH (ref 70–99)

## 2021-05-21 MED ORDER — SODIUM CHLORIDE 0.9 % IV BOLUS
1000.0000 mL | Freq: Once | INTRAVENOUS | Status: AC
  Administered 2021-05-21: 1000 mL via INTRAVENOUS

## 2021-05-21 MED ORDER — DIPHENHYDRAMINE HCL 50 MG/ML IJ SOLN
25.0000 mg | Freq: Once | INTRAMUSCULAR | Status: AC
  Administered 2021-05-21: 25 mg via INTRAVENOUS
  Filled 2021-05-21: qty 1

## 2021-05-21 MED ORDER — CLOPIDOGREL BISULFATE 75 MG PO TABS
75.0000 mg | ORAL_TABLET | Freq: Every day | ORAL | Status: DC
  Administered 2021-05-21 – 2021-05-23 (×3): 75 mg via ORAL
  Filled 2021-05-21 (×3): qty 1

## 2021-05-21 MED ORDER — ENOXAPARIN SODIUM 40 MG/0.4ML IJ SOSY
40.0000 mg | PREFILLED_SYRINGE | INTRAMUSCULAR | Status: DC
  Administered 2021-05-21 – 2021-05-23 (×3): 40 mg via SUBCUTANEOUS
  Filled 2021-05-21 (×3): qty 0.4

## 2021-05-21 MED ORDER — LORATADINE 10 MG PO TABS
10.0000 mg | ORAL_TABLET | Freq: Every day | ORAL | Status: DC
  Administered 2021-05-21 – 2021-05-23 (×3): 10 mg via ORAL
  Filled 2021-05-21 (×3): qty 1

## 2021-05-21 MED ORDER — ATORVASTATIN CALCIUM 80 MG PO TABS
80.0000 mg | ORAL_TABLET | Freq: Every day | ORAL | Status: DC
  Administered 2021-05-21 – 2021-05-23 (×3): 80 mg via ORAL
  Filled 2021-05-21: qty 1
  Filled 2021-05-21: qty 2
  Filled 2021-05-21: qty 1

## 2021-05-21 MED ORDER — ACETAMINOPHEN 160 MG/5ML PO SOLN: 650.0000 mg | ORAL | Status: DC | PRN

## 2021-05-21 MED ORDER — FLUTICASONE PROPIONATE 50 MCG/ACT NA SUSP
1.0000 | Freq: Every day | NASAL | Status: DC
  Administered 2021-05-21: 2 via NASAL
  Administered 2021-05-22 – 2021-05-23 (×2): 1 via NASAL
  Filled 2021-05-21: qty 16

## 2021-05-21 MED ORDER — STROKE: EARLY STAGES OF RECOVERY BOOK
Freq: Once | Status: AC
  Filled 2021-05-21: qty 1

## 2021-05-21 MED ORDER — GUAIFENESIN ER 600 MG PO TB12
600.0000 mg | ORAL_TABLET | Freq: Two times a day (BID) | ORAL | Status: DC
  Administered 2021-05-22 – 2021-05-23 (×3): 600 mg via ORAL
  Filled 2021-05-21 (×3): qty 1

## 2021-05-21 MED ORDER — SENNOSIDES-DOCUSATE SODIUM 8.6-50 MG PO TABS: 1.0000 | ORAL_TABLET | Freq: Every evening | ORAL | Status: DC | PRN

## 2021-05-21 MED ORDER — ACETAMINOPHEN 650 MG RE SUPP: 650.0000 mg | RECTAL | Status: DC | PRN

## 2021-05-21 MED ORDER — DIPHENHYDRAMINE HCL 50 MG/ML IJ SOLN
INTRAMUSCULAR | Status: AC
  Filled 2021-05-21: qty 1

## 2021-05-21 MED ORDER — IOHEXOL 350 MG/ML SOLN
80.0000 mL | Freq: Once | INTRAVENOUS | Status: AC | PRN
  Administered 2021-05-21: 80 mL via INTRAVENOUS

## 2021-05-21 MED ORDER — ASPIRIN 81 MG PO CHEW
81.0000 mg | CHEWABLE_TABLET | Freq: Every day | ORAL | Status: DC
  Administered 2021-05-21 – 2021-05-23 (×3): 81 mg via ORAL
  Filled 2021-05-21 (×3): qty 1

## 2021-05-21 MED ORDER — SODIUM CHLORIDE 0.9 % IV SOLN: Freq: Once | INTRAVENOUS | Status: AC

## 2021-05-21 MED ORDER — HYDRALAZINE HCL 20 MG/ML IJ SOLN: 10.0000 mg | INTRAMUSCULAR | Status: DC | PRN

## 2021-05-21 MED ORDER — SODIUM CHLORIDE 0.9% FLUSH
3.0000 mL | Freq: Once | INTRAVENOUS | Status: AC
  Administered 2021-05-21: 3 mL via INTRAVENOUS

## 2021-05-21 MED ORDER — BUTALBITAL-APAP-CAFFEINE 50-325-40 MG PO TABS
1.0000 | ORAL_TABLET | ORAL | Status: AC
  Administered 2021-05-21: 1 via ORAL
  Filled 2021-05-21: qty 1

## 2021-05-21 MED ORDER — PROCHLORPERAZINE EDISYLATE 10 MG/2ML IJ SOLN
10.0000 mg | Freq: Once | INTRAMUSCULAR | Status: AC
  Administered 2021-05-21: 10 mg via INTRAVENOUS
  Filled 2021-05-21: qty 2

## 2021-05-21 MED ORDER — ACETAMINOPHEN 325 MG PO TABS
650.0000 mg | ORAL_TABLET | ORAL | Status: DC | PRN
  Administered 2021-05-22 – 2021-05-23 (×3): 650 mg via ORAL
  Filled 2021-05-21 (×3): qty 2

## 2021-05-21 NOTE — ED Triage Notes (Signed)
Pt BIB [facility] from home as Code Stroke d/t wife stating that he began c/o being dizzy, having HA & asking repetitive questions. LKN was 0800 & EMS reports that his initial BP was 200/100, CBG 125, monitor showed NSR & 62 bpm, 98% on RA. 18g PIV in Lt AC, while en route to ED his BP went down to 170/90.  ?

## 2021-05-21 NOTE — Code Documentation (Signed)
Stroke Response Nurse Documentation ?Code Documentation ? ?Ian Golden is a 56 y.o. male arriving to [facility]  via [facility] on 05/21/21 with past medical hx of migraine, ulcer, and allergic rhinitis. On No antithrombotic. Code stroke was activated by EMS.  ? ?Patient from home where he was LKW at 0800 and now complaining of dizziness, headache, and he was asking repetitive questions . BP on arrival was 200/100 but the recheck showed 170/90. ? ?Stroke team at the bedside on patient arrival. Labs drawn and patient cleared for CT by Dr. Tamala Julian. Patient to CT with team. NIHSS 0 ?, see documentation for details and code stroke times. The following imaging was completed:  CT Head. Patient is not a candidate for IV Thrombolytic due to low suspicion of CVA and NIH 0.  ? ?Care Plan: VS and neurochecks q2h x 12 hours, then q4h.  ? ?Bedside handoff with ED RN Ian Golden.   ? ?Ian Golden  ?Rapid Response RN ? ? ?

## 2021-05-21 NOTE — ED Notes (Signed)
Neurologist at bedside. 

## 2021-05-21 NOTE — Progress Notes (Signed)
EEG complete - results pending 

## 2021-05-21 NOTE — ED Provider Notes (Signed)
?[place] ?Provider Note ? ? ?CSN: [id number] ?Arrival date & time: 05/21/21  0956 ? ?An emergency department physician performed an initial assessment on this suspected stroke patient at 21. ? ?History ? ?Chief Complaint  ?Patient presents with  ? Code Stroke  ? ? ?Ian Golden is a 56 y.o. male. ? ?HPI ? ?  ? ?56 year old male comes in with chief complaint of confusion. ?Patient has history of hypertension.  He arrives via EMS with activation of code stroke. ? ?Patient has no complaints at this time.  EMS indicated that family took patient to the fire department because patient was complaining of dizziness and headache.  He was also asking the same questions over and over again and was confused. ? ?I did have a chance to speak with patient's wife.  She informed me that patient was last known normal around 8 AM, when they woke up and he was acting normally.  At 9 AM when she went to check on him, he was confused.  They are expecting the patient to work with a Development worker, community, but patient could not recollect having any plumbing issues, and could not recall having a plan to meet a plumber.  Additionally, he could not use his phone or recall the password of his phone.  Early in the morning, patient had complained of dizziness.  Now patient is complaining of headache. ? ?Patient denies any change in vision, focal numbness, weakness, slurred speech. ? ?Home Medications ?Prior to Admission medications   ?Medication Sig Start Date End Date Taking? Authorizing Provider  ?Ascorbic Acid (VITAMIN C PO) Take 1 tablet by mouth daily.   Yes [provider]  ?fluocinonide ointment (LIDEX) 0.05 % Apply topically 2 (two) times daily. 09/24/16  Yes Copland, Frederico Hamman, MD  ?SUMAtriptan Succinate Refill 6 MG/0.5ML SOCT Inject 1 Syringe into the skin as needed. Inject one syringe subcutaneously at onset of headache. May repeat in 2 hours prn x 1 01/10/21  Yes Copland, Spencer, MD  ?TURMERIC PO Take  1 tablet by mouth daily.   Yes [provider]  ?   ? ?Allergies    ?Patient has no known allergies.   ? ?Review of Systems   ?Review of Systems  ?All other systems reviewed and are negative. ? ?Physical Exam ?Updated Vital Signs ?BP (!) 156/75   Pulse 60   Resp 16   Ht 6\' 2"  (1.88 m)   Wt 83.9 kg   SpO2 97%   BMI 23.75 kg/m?  ?Physical Exam ?Vitals and nursing note reviewed.  ?Constitutional:   ?   Appearance: He is well-developed.  ?HENT:  ?   Head: Atraumatic.  ?   Nose: No congestion.  ?Eyes:  ?   Extraocular Movements: Extraocular movements intact.  ?   Pupils: Pupils are equal, round, and reactive to light.  ?Cardiovascular:  ?   Rate and Rhythm: Normal rate.  ?Pulmonary:  ?   Effort: Pulmonary effort is normal.  ?Musculoskeletal:  ?   Cervical back: Neck supple.  ?Skin: ?   General: Skin is warm.  ?Neurological:  ?   Mental Status: He is alert and oriented to person, place, and time.  ?   Cranial Nerves: No cranial nerve deficit.  ?   Sensory: No sensory deficit.  ?   Motor: No weakness.  ?   Coordination: Coordination normal.  ? ? ?ED Results / Procedures / Treatments   ?Labs ?(all labs ordered are listed, but only abnormal  results are displayed) ?Labs Reviewed  ?DIFFERENTIAL - Abnormal; Notable for the following components:  ?    Result Value  ? Eosinophils Absolute 0.6 (*)   ? All other components within normal limits  ?COMPREHENSIVE METABOLIC PANEL - Abnormal; Notable for the following components:  ? Total Protein 6.4 (*)   ? All other components within normal limits  ?I-STAT CHEM 8, ED - Abnormal; Notable for the following components:  ? BUN 22 (*)   ? All other components within normal limits  ?CBG MONITORING, ED - Abnormal; Notable for the following components:  ? Glucose-Capillary 103 (*)   ? All other components within normal limits  ?PROTIME-INR  ?APTT  ?CBC  ?HEMOGLOBIN A1C  ?LDL CHOLESTEROL, DIRECT  ?HIV ANTIBODY (ROUTINE TESTING W REFLEX)  ?HEMOGLOBIN A1C  ?LIPID PANEL   ? ? ?EKG ?EKG Interpretation ? ?Date/Time:  Sunday May 21 2021 10:18:28 EDT ?Ventricular Rate:  58 ?PR Interval:  176 ?QRS Duration: 99 ?QT Interval:  398 ?QTC Calculation: 391 ?R Axis:   46 ?Text Interpretation: Sinus rhythm Probable left atrial enlargement LVH with secondary repolarization abnormality Borderline ST elevation, inferior leads TWI in the lateral leads Nonspecific ST and T wave abnormality Confirmed by Varney Biles [phone]) on 05/21/2021 1:58:24 PM ? ?Radiology ?CT ANGIO HEAD NECK W WO CM ? ?Result Date: 05/21/2021 ?CLINICAL DATA:  56 year old male code stroke presentation. EXAM: CT ANGIOGRAPHY HEAD AND NECK TECHNIQUE: Multidetector CT imaging of the head and neck was performed using the standard protocol during bolus administration of intravenous contrast. Multiplanar CT image reconstructions and MIPs were obtained to evaluate the vascular anatomy. Carotid stenosis measurements (when applicable) are obtained utilizing NASCET criteria, using the distal internal carotid diameter as the denominator. RADIATION DOSE REDUCTION: This exam was performed according to the departmental dose-optimization program which includes automated exposure control, adjustment of the mA and/or kV according to patient size and/or use of iterative reconstruction technique. CONTRAST:  47mL OMNIPAQUE IOHEXOL 350 MG/ML SOLN COMPARISON:  Head CT 1010 hours today. FINDINGS: CTA NECK Skeleton: Mild motion artifact, especially at the mandible. No acute osseous abnormality identified. Upper chest: Negative. Other neck: Mild motion artifact. No acute soft tissue findings in the neck. Aortic arch: 3 vessel arch configuration. No arch atherosclerosis. Right carotid system: Mild motion artifact. Mild tortuosity. No atherosclerosis or stenosis identified. Left carotid system: Mild motion artifact. Mild tortuosity. No atherosclerosis or stenosis identified. Evaluating thick MIP images of the bilateral cervical carotids there is a beaded  appearance in addition to tortuosity more apparent on the right (series 12, image 16). But also visible on the left (series 11, image 23). Vertebral arteries: Normal proximal right subclavian artery and right vertebral artery origin. Highly tortuous right vertebral artery throughout the neck and to the skull base but no atherosclerosis or stenosis identified. Mild soft plaque in the proximal left subclavian artery with no significant stenosis. Normal left vertebral artery origin. Left vertebral artery is dominant and tortuous to the skull base with no atherosclerosis or stenosis. CTA HEAD Posterior circulation: Dominant left V4 segment. No distal vertebral or vertebrobasilar junction plaque or stenosis. Patent PICA origins. Patent basilar artery without stenosis. Normal SCA and PCA origins. Small posterior communicating arteries. Bilateral PCA branches are within normal limits. Anterior circulation: Both ICA siphons are patent with no atherosclerosis or stenosis. Normal posterior communicating artery origins. Patent carotid termini. Normal MCA and ACA origins. Dominant left A1, the right is diminutive. Anterior communicating artery and bilateral ACA branches are within normal limits. Left  MCA M1 segment bifurcates early without stenosis. Right MCA M1 segment is tortuous and bifurcates without stenosis. Bilateral MCA branches are within normal limits. Venous sinuses: Early contrast timing but the superior sagittal sinus, torcula, transverse and sigmoid sinuses are enhancing and appear patent. Anatomic variants: Dominant left vertebral artery. Dominant left and diminutive right ACA A1 segment. Review of the MIP images confirms the above findings IMPRESSION: 1. Negative for large vessel occlusion.  Mild motion artifact. 2. Minimal atherosclerosis in the head or neck with no arterial stenosis identified. But positive for bilateral cervical ICA Fibromuscular Dysplasia (FMD). These results were communicated to Dr. Quinn Axe  at 12:12 pm on 05/21/2021 by text page via the [facility] messaging system. Electronically Signed   By: Genevie Ann M.D.   On: 05/21/2021 12:18  ? ?MR BRAIN WO CONTRAST ? ?Result Date: 05/21/2021 ?CLINICAL DATA:  Transient global am

## 2021-05-21 NOTE — Assessment & Plan Note (Addendum)
Patient complains of having sinus congestion and headache related to allergies.  Continue Claritin and Flonase. ?

## 2021-05-21 NOTE — H&P (Signed)
?History and Physical  ? ? ?Patient: Ian Golden [id number] DOB: [birth year] ?DOA: 05/21/2021 ?DOS: the patient was seen and examined on 05/21/2021 ?PCP: Owens Loffler, MD  ?Patient coming from: Fire department via EMS ? ?Chief Complaint:  ?Chief Complaint  ?Patient presents with  ? Code Stroke  ? ?HPI: Ian Golden is a 56 y.o. male with medical history significant of migraine headaches and seasonal allergies who presents after being noted to be altered this morning.  Last known normal was 8 AM this morning.  His wife helps provide history and states that this morning the patient had been standing in the kitchen and had complained of sdizziness and headache.  His wife had told him to go lay down and then around 9 AM went to go check on him, and noted he seemed very confused.  A plumber was supposed to be coming to the house, he had no recollection of it, and Asking repetitive questions.  Thereafter they took him to the fire department which is right around the corner from where they live, and had him evaluated where they immediately called EMS.  EMS noted patient's initial blood pressure was 200/100 with all other vital signs maintained.  Patient does not smoke or drink alcohol.  At this time he complains of sinus congestion, bitemporal headache, and neck discomfort.  Denies any changes in vision, fever, chest pain, palpitations, focal weakness, nausea, vomiting, or shortness of breath.  His wife also notes that after getting to the hospital patient reported no recollection of being taken to the fire department. ? ?Upon admission into the emergency department as a code stroke and was evaluated by neurology.  Patient was noted to be afebrile with blood pressures elevated to 180/97, and all other vitals within normal limits.  Labs note BUN 22 and creatinine 0.9.  Initial CT scan did not note any acute abnormality.  He had complained of headache after getting out of the CT scanner.  Patient had been given 1 L normal  saline IV fluids, Compazine, and Benadryl.  Neurology started patient on aspirin, Plavix, and atorvastatin.  CT angiogram of the head and neck was pending. ? ?Review of Systems: As mentioned in the history of present illness. All other systems reviewed and are negative. ? ?Past Medical History:  ?Diagnosis Date  ? Allergic rhinitis, cause unspecified   ? Hemorrhoids   ? Migraine   ? Ulcer   ? ?Past Surgical History:  ?Procedure Laterality Date  ? WISDOM TOOTH EXTRACTION    ? with sedation  ? ?Social History:  reports that he has never smoked. He has never used smokeless tobacco. He reports that he does not drink alcohol and does not use drugs. ? ?No Known Allergies ? ?Family History  ?Problem Relation Age of Onset  ? Colon polyps Father   ? Heart attack Mother   ? Heart disease Mother   ? Other Mother   ?     coroner said there were signs of possible start of dementia   ? Colon cancer Neg Hx   ? Esophageal cancer Neg Hx   ? Rectal cancer Neg Hx   ? Stomach cancer Neg Hx   ? ? ?Prior to Admission medications   ?Medication Sig Start Date End Date Taking? Authorizing Provider  ?fluocinonide ointment (LIDEX) 0.05 % Apply topically 2 (two) times daily. 09/24/16   Copland, Frederico Hamman, MD  ?SUMAtriptan Succinate Refill 6 MG/0.5ML SOCT Inject 1 Syringe into the skin as needed. Inject one syringe  subcutaneously at onset of headache. May repeat in 2 hours prn x 1 01/10/21   Copland, Frederico Hamman, MD  ? ? ?Physical Exam: ?Vitals:  ? 05/21/21 1045 05/21/21 1100 05/21/21 1115 05/21/21 1145  ?BP: (!) 162/90 (!) 165/100 (!) 166/92 (!) 180/97  ?Pulse: (!) 57 (!) 57 (!) 59 72  ?Resp: (!) 9 12 (!) 9 16  ?SpO2: 96% 98% 97% 98%  ?Weight:      ?Height:      ? ? ?Constitutional: Middle-aged male currently resting in no acute distress ?Eyes: PERRL, lids and conjunctivae normal ?ENMT: Mucous membranes are moist. Posterior pharynx clear of any exudate or lesions.  ?Neck: normal, supple, no masses, no thyromegaly ?Respiratory: clear to auscultation  bilaterally, no wheezing, no crackles. Normal respiratory effort. No accessory muscle use.  ?Cardiovascular: Regular rate and rhythm, no murmurs / rubs / gallops. No extremity edema. 2+ pedal pulses. No carotid bruits.  ?Abdomen: no tenderness, no masses palpated. No hepatosplenomegaly. Bowel sounds positive.  ?Musculoskeletal: no clubbing / cyanosis. No joint deformity upper and lower extremities. Good ROM, no contractures. Normal muscle tone.  ?Skin: no rashes, lesions, ulcers. No induration ?Neurologic: CN 2-12 grossly intact. Sensation intact, DTR normal. Strength 5/5 in all 4.  ?Psychiatric: Normal judgment and insight. Alert and oriented x 3. Normal mood.  ? ? ?Data Reviewed: ? ?EKG reveals sinus rhythm at 58 bpm.  Reviewed labs and imaging and ? ?Assessment and Plan: ?* Transient global amnesia secondary to CVA ?Patient presents with transient global amnesia.  CTA of the head and neck negative for any large vessel occlusion.  MRI revealed scattered acute infarcts of the bilateral cerebellar hemispheres and occipital lobes. ?-Admit to telemetry bed ?-Stroke order set initiated ?-Neuro checks ?-Allow for permissive hypertension  ?-Check Hemoglobin A1c and lipid panel in a.m. ?-Check echocardiogram ?-PT/OT/speech ?-ASA, Plavix, and statin ?-Appreciate neurology consultative services, will follow-up for any further recommendations  ? ?Hypertensive urgency ?Blood pressures initially elevated up to 180/97.  He is not on any medications for blood pressure at baseline. ?-Allowing for permissive hypertension ?-Hydralazine IV for systolic blood pressures greater than [id number] or diastolic blood pressure greater than 110 ? ?Sinus headache ?Patient complains of having sinus congestion and headache related to allergies. ?-Claritin ?-Flonase ? ?Hyperlipidemia ?Lipid panel revealed total cholesterol 200, HDL 35, LDL 149, triglycerides 78, and VLDL 16.  Patient has been started on atorvastatin 80 mg daily. ?-Continue  atorvastatin ?-Goal LDL less than 70 ? ? ? ?Advance Care Planning:   Code Status: Full Code  ? ?Consults: Neurology ? ?Family Communication: Wife updated at bedside ? ?Severity of Illness: ?The appropriate patient status for this patient is INPATIENT. Inpatient status is judged to be reasonable and necessary in order to provide the required intensity of service to ensure the patient's safety. The patient's presenting symptoms, physical exam findings, and initial radiographic and laboratory data in the context of their chronic comorbidities is felt to place them at high risk for further clinical deterioration. Furthermore, it is not anticipated that the patient will be medically stable for discharge from the hospital within 2 midnights of admission.  ? ?* I certify that at the point of admission it is my clinical judgment that the patient will require inpatient hospital care spanning beyond 2 midnights from the point of admission due to high intensity of service, high risk for further deterioration and high frequency of surveillance required.* ? ?Author: ?Norval Morton, MD ?05/21/2021 11:55 AM ? ?For on call review [email or web address].  ?

## 2021-05-21 NOTE — ED Notes (Signed)
Patient transported to MRI 

## 2021-05-21 NOTE — Consult Note (Signed)
NEUROLOGY CONSULTATION NOTE  ? ?Date of service: May 21, 2021 ?Patient Name: Ian Golden ?MRN:  [id number] ?DOB:  11/28/1965 ?Reason for consult: stroke code ?Requesting physician: Dr. Varney Biles ?_ _ _   _ __   _ __ _ _  __ __   _ __   __ _ ? ?History of Present Illness  ? ?This is a 56 year old man with a past medical history significant for rare episodic migraines who presented to the emergency department with acute onset of confusion.  Last known well was 8 AM this morning.  At 9 AM wife went to check on him and noted that he was confused and could not remember events over the last 24 hours.  He was unable to remember waking up this morning or what he had for breakfast.  He was also unable to recall the password of his phone.  Earlier in the morning patient had complained of dizziness but that resolved prior to arrival.  On examination patient had short-term memory loss but no focal neurologic deficits and stroke scale was 0.  CT head was performed and personally reviewed and showed no acute intracranial process.  TNK was not administered due to no focal neurologic deficits.  CTA was not performed as part of the stroke code due to exam not being consistent with LVO but was performed afterwards and was notable for bilateral cervical ICA fibromuscular dysplasia. Wife reports that patient's mother had MTHFR prothrombin gene mutation. MRI brain wo contrast performed after the stroke code showed scattered acute infarcts in the bilateral cerebellar hemispheres and occipital lobes, mostly punctate.  All CNS imaging was personally reviewed. ?  ?ROS  ? ?Per HPI: all other systems reviewed and are negative ? ?Past History  ? ?I have reviewed the following: ? ?Past Medical History:  ?Diagnosis Date  ? Allergic rhinitis, cause unspecified   ? Hemorrhoids   ? Migraine   ? Ulcer   ? ?Past Surgical History:  ?Procedure Laterality Date  ? WISDOM TOOTH EXTRACTION    ? with sedation  ? ?Family History  ?Problem Relation Age  of Onset  ? Colon polyps Father   ? Heart attack Mother   ? Heart disease Mother   ? Other Mother   ?     coroner said there were signs of possible start of dementia   ? Colon cancer Neg Hx   ? Esophageal cancer Neg Hx   ? Rectal cancer Neg Hx   ? Stomach cancer Neg Hx   ? ?Social History  ? ?Socioeconomic History  ? Marital status: Married  ?  Spouse name: Hilda Blades  ? Number of children: 3  ? Years of education: Not on file  ? Highest education level: Not on file  ?Occupational History  ?  Employer: [employer]  ?  Comment: Law Enforcement  ?Tobacco Use  ? Smoking status: Never  ? Smokeless tobacco: Never  ?Substance and Sexual Activity  ? Alcohol use: Never  ?  Alcohol/week: 0.0 standard drinks  ? Drug use: Never  ? Sexual activity: Yes  ?  Partners: Female  ?Other Topics Concern  ? Not on file  ?Social History Narrative  ? 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 Regular exercise- no  ? Right handed  ? ?Social Determinants of Health  ? ?Financial Resource Strain: Not on file  ?Food Insecurity: Not on file  ?Transportation Needs: Not on file  ?Physical Activity: Not on file  ?Stress:  Not on file  ?Social Connections: Not on file  ? ?No Known Allergies ? ?Medications  ? ?(Not in a hospital admission) ?  ? ? ?Current Facility-Administered Medications:  ?  acetaminophen (TYLENOL) tablet 650 mg, 650 mg, Oral, Q4H PRN **OR** acetaminophen (TYLENOL) 160 MG/5ML solution 650 mg, 650 mg, Per Tube, Q4H PRN **OR** acetaminophen (TYLENOL) suppository 650 mg, 650 mg, Rectal, Q4H PRN, Smith, Rondell A, MD ?  aspirin chewable tablet 81 mg, 81 mg, Oral, Daily, Derek Jack, MD, 81 mg at 05/21/21 1201 ?  atorvastatin (LIPITOR) tablet 80 mg, 80 mg, Oral, Daily, Derek Jack, MD, 80 mg at 05/21/21 1201 ?  clopidogrel (PLAVIX) tablet 75 mg, 75 mg, Oral, Daily, Derek Jack, MD, 75 mg at 05/21/21 1201 ?  diphenhydrAMINE (BENADRYL) 50 MG/ML injection, , , ,  ?  enoxaparin (LOVENOX) injection 40 mg, 40 mg,  Subcutaneous, Q24H, Smith, Rondell A, MD, 40 mg at 05/21/21 1326 ?  fluticasone (FLONASE) 50 MCG/ACT nasal spray 1-2 spray, 1-2 spray, Each Nare, Daily, Smith, Rondell A, MD ?  guaiFENesin (MUCINEX) 12 hr tablet 600 mg, 600 mg, Oral, BID, Smith, Rondell A, MD ?  hydrALAZINE (APRESOLINE) injection 10 mg, 10 mg, Intravenous, Q4H PRN, Tamala Julian, Rondell A, MD ?  loratadine (CLARITIN) tablet 10 mg, 10 mg, Oral, Daily, Smith, Rondell A, MD, 10 mg at 05/21/21 1814 ?  senna-docusate (Senokot-S) tablet 1 tablet, 1 tablet, Oral, QHS PRN, Norval Morton, MD ? ?Current Outpatient Medications:  ?  Ascorbic Acid (VITAMIN C PO), Take 1 tablet by mouth daily., Disp: , Rfl:  ?  fluocinonide ointment (LIDEX) 0.05 %, Apply topically 2 (two) times daily., Disp: 60 g, Rfl: 0 ?  SUMAtriptan Succinate Refill 6 MG/0.5ML SOCT, Inject 1 Syringe into the skin as needed. Inject one syringe subcutaneously at onset of headache. May repeat in 2 hours prn x 1, Disp: 2.5 mL, Rfl: 0 ?  TURMERIC PO, Take 1 tablet by mouth daily., Disp: , Rfl:  ? ?Vitals  ? ?Vitals:  ? 05/21/21 1400 05/21/21 1700 05/21/21 1730 05/21/21 1800  ?BP: (!) 198/101 (!) 158/96 (!) 152/91 (!) 138/94  ?Pulse: (!) 50 73 67 66  ?Resp: 18 19 19    ?SpO2: 98% 99% 94% 95%  ?Weight:      ?Height:      ?  ? ?Body mass index is 23.75 kg/m?. ? ?Physical Exam  ? ?Physical Exam ?Gen: alert, oriented x3 but repeatedly asking same questions over and over, does not remember events from this AM ?HEENT: Atraumatic, normocephalic;mucous membranes moist; oropharynx clear, tongue without atrophy or fasciculations. ?Neck: Supple, trachea midline. ?Resp: CTAB, no w/r/r ?CV: RRR, no m/g/r; nml S1 and S2. 2+ symmetric peripheral pulses. ?Abd: soft/NT/ND; nabs x 4 quad ?Extrem: Nml bulk; no cyanosis, clubbing, or edema. ? ?Neuro: ?*MS: alert, oriented x3 but repeatedly asking same questions over and over, does not remember events from this AM ?*Speech: fluid, nondysarthric, able to name and repeat ?*CN:   ?  I: Deferred ?  II,III: PERRLA, VFF by confrontation, optic discs unable to be visualized 2/2 pupillary constriction ?  III,IV,VI: EOMI w/o nystagmus, no ptosis ?  V: Sensation intact from V1 to V3 to LT ?  VII: Eyelid closure was full.  Smile symmetric. ?  VIII: Hearing intact to voice ?  IX,X: Voice normal, palate elevates symmetrically  ?  XI: SCM/trap 5/5 bilat   ?XII: Tongue protrudes midline, no atrophy or fasciculations  ? ?*Motor:   Normal  bulk.  No tremor, rigidity or bradykinesia. No pronator drift. ? ?  Strength: Dlt Bic Tri WrE WrF FgS Gr HF KnF KnE PlF DoF  ?  Left 5 5 5 5 5 5 5 5 5 5 5 5   ?  Right 5 5 5 5 5 5 5 5 5 5 5 5   ? ?*Sensory: Intact to light touch, pinprick, temperature vibration throughout. Symmetric. Propioception intact bilat.  No double-simultaneous extinction.  ?*Coordination:  Finger-to-nose, heel-to-shin, rapid alternating motions were intact. ?*Reflexes:  2+ and symmetric throughout without clonus; toes down-going bilat ?*Gait: deferred ? ?NIHSS = 0 ? ? ?Premorbid mRS = 0 ? ? ?Labs  ? ?CBC:  ?Recent Labs  ?Lab 05/21/21 ?1000 05/21/21 ?1007  ?WBC 6.1  --   ?NEUTROABS 3.4  --   ?HGB 14.9 14.6  ?HCT 43.3 43.0  ?MCV 88.5  --   ?PLT 201  --   ? ? ?Basic Metabolic Panel:  ?Lab Results  ?Component Value Date  ? NA 142 05/21/2021  ? K 4.0 05/21/2021  ? CO2 25 05/21/2021  ? GLUCOSE 91 05/21/2021  ? BUN 22 (H) 05/21/2021  ? CREATININE 0.90 05/21/2021  ? CALCIUM 9.1 05/21/2021  ? GFRNONAA >60 05/21/2021  ? GFRAA 95 11/18/2019  ? ?Lipid Panel:  ?Lab Results  ?Component Value Date  ? LDLCALC 149 (H) 05/21/2021  ? ?HgbA1c:  ?Lab Results  ?Component Value Date  ? HGBA1C 5.6 05/21/2021  ? ?Urine Drug Screen: No results found for: LABOPIA, COCAINSCRNUR, [place], [place], THCU, LABBARB  ?Alcohol Level No results found for: ETH ? ? ?Impression  ? ?This is a 56 year old man with a past medical history significant for rare episodic migraines who presented to the emergency department with acute onset of  confusion. Stroke code was activated and TNK was not administered 2/2 NIHSS = 0 with no focal deficits. CTA was not performed as part of the stroke code due to exam not being consistent with LVO but

## 2021-05-21 NOTE — Assessment & Plan Note (Addendum)
LDL 149.  Started on statin. ?

## 2021-05-21 NOTE — ED Notes (Signed)
Pt initially c/o HA once he was out of the CT scanner that was 7/10 located at his forehead & lower back of his head & explained it to feel almost "numbing like" but nothing on his face was numb (per pt). ?He now states that the HA is on the Lt & Rt side of his head near his temples, rate stayed the same at 7/10 & still feels numbing, with an additional pain felt in his neck.  ?

## 2021-05-21 NOTE — ED Notes (Signed)
Assumed care at this time, pt resting comfortably in bed. Updated pt and family on plan of care, no needs expressed at this time. ?

## 2021-05-21 NOTE — Assessment & Plan Note (Addendum)
Blood pressures initially elevated up to 180/97.  He is not on any medications for blood pressure at baseline. ?Permissive hypertension was allowed. ?Neurology recommends gradual normalization in 5 to 7 days. ?Blood pressure noted to be 142/77.  Would recommend this be monitored closely in the outpatient setting.  May need antihypertensives.  Recommend follow-up with PCP next week. ?

## 2021-05-22 ENCOUNTER — Inpatient Hospital Stay (HOSPITAL_COMMUNITY): Payer: 59

## 2021-05-22 ENCOUNTER — Encounter (HOSPITAL_COMMUNITY): Payer: Self-pay | Admitting: Internal Medicine

## 2021-05-22 DIAGNOSIS — I6389 Other cerebral infarction: Secondary | ICD-10-CM

## 2021-05-22 LAB — BASIC METABOLIC PANEL
Anion gap: 6 (ref 5–15)
BUN: 14 mg/dL (ref 6–20)
CO2: 22 mmol/L (ref 22–32)
Calcium: 8.6 mg/dL — ABNORMAL LOW (ref 8.9–10.3)
Chloride: 110 mmol/L (ref 98–111)
Creatinine, Ser: 1.06 mg/dL (ref 0.61–1.24)
GFR, Estimated: >60 mL/min (ref >60)
Glucose, Bld: 97 mg/dL (ref 70–99)
Potassium: 3.9 mmol/L (ref 3.5–5.1)
Sodium: 138 mmol/L (ref 135–145)

## 2021-05-22 LAB — CBC
HCT: 39.5 % (ref 39.0–52.0)
Hemoglobin: 13.5 g/dL (ref 13.0–17.0)
MCH: 30.3 pg (ref 26.0–34.0)
MCHC: 34.2 g/dL (ref 30.0–36.0)
MCV: 88.8 fL (ref 80.0–100.0)
Platelets: 182 10*3/uL (ref 150–400)
RBC: 4.45 MIL/uL (ref 4.22–5.81)
RDW: 12.4 % (ref 11.5–15.5)
WBC: 8.7 10*3/uL (ref 4.0–10.5)
nRBC: 0 % (ref 0.0–0.2)

## 2021-05-22 LAB — ECHOCARDIOGRAM COMPLETE BUBBLE STUDY
Area-P 1/2: 3.21 cm2
S' Lateral: 2.8 cm

## 2021-05-22 NOTE — Progress Notes (Signed)
? ?[facility] ?PROGRESS NOTE ? ? ?ZAIYAN ZEMAN [id number] DOB: [birth year] DOA: 05/21/2021  1 ?DOS: the patient was seen and examined on 05/22/2021 ? ?PCP: Owens Loffler, MD ? ?Brief History and Hospital Course:  ?56 y.o. male with medical history significant of migraine headaches and seasonal allergies who presented after being noted to be confused on the morning of admission.  Apparently forgot a few things while he was in the kitchen.  Felt dizzy and lightheaded.  No syncopal episodes reported.  His wife took him to the fire department where he was noted to be hypertensive.  EMS was called.  Patient brought into the hospital.  Evaluation positive for stroke concerning for embolic in etiology.  Patient started on aspirin Plavix statin.  Hospitalized for further management. ? ?Consultants: Neurology ? ?Procedures: Transthoracic echocardiogram is pending ? ? ? ?Subjective: ?Patient mentions that he is feeling better this morning.  Confusion appears to have resolved.  Denies any headaches this morning.  No chest pain shortness of breath. ? ? ? ?Assessment/Plan: ? ? ?Acute CVA (cerebrovascular accident) ([place]) ?Patient presented with confusion and feeling lightheaded.   ?MRI revealed scattered acute infarct in bilateral cerebellar hemisphere.  Noted in occipital lobes as well ?Neurology consulted.   ?Patient noted to be on aspirin and Plavix.   ?PT OT SLP evaluation is pending.   ?LDL 149.  HbA1c 5.6.  EKG showed sinus rhythm. ?EEG did not show any epileptiform activity. ?Waiting on echocardiogram.   ?Neurology to reevaluate today. ?Patient reports history of blood clots in his mother who also has a history of prothrombin gene mutation.  Patient apparently was tested about 5 years ago and was told that he has a "slight risk." ? ?Hypertensive urgency ?Blood pressures initially elevated up to 180/97.  He is not on any medications for blood pressure at baseline. ?Allow permissive hypertension for  now. ? ?Hyperlipidemia ?LDL 149.  Started on statin. ? ?Sinus headache ?Patient complains of having sinus congestion and headache related to allergies.  Continue Claritin and Flonase. ? ? ? ? ?DVT Prophylaxis: Lovenox ?Code Status: Full code ?Family Communication: Discussed with patient ?Disposition Plan: Hopefully return home when improved ? ?Status is: Inpatient ?Remains inpatient appropriate because: Acute stroke ? ? ? ? ?Medications: Scheduled: ? aspirin  81 mg Oral Daily  ? atorvastatin  80 mg Oral Daily  ? clopidogrel  75 mg Oral Daily  ? enoxaparin (LOVENOX) injection  40 mg Subcutaneous Q24H  ? fluticasone  1-2 spray Each Nare Daily  ? guaiFENesin  600 mg Oral BID  ? loratadine  10 mg Oral Daily  ? ?Continuous: ?[id number] **OR** acetaminophen (TYLENOL) oral liquid 160 mg/5 mL **OR** acetaminophen, hydrALAZINE, senna-docusate ? ?Antibiotics: ?Anti-infectives (From admission, onward)  ? ? None  ? ?  ? ? ?Objective: ? ?Vital Signs ? ?Vitals:  ? 05/22/21 0230 05/22/21 0400 05/22/21 0600 05/22/21 0740  ?BP: (!) 141/86 129/84 127/75   ?Pulse: 63 62 61   ?Resp: 16 19 13    ?Temp:   98.2 ?F (36.8 ?C)   ?TempSrc:   Oral   ?SpO2: 96% 94% 96% 95%  ?Weight:      ?Height:      ? ?No intake or output data in the 24 hours ending 05/22/21 0844 ?Filed Weights  ? 05/21/21 1024  ?Weight: 83.9 kg  ? ? ?General appearance: Awake alert.  In no distress ?Resp: Clear to auscultation bilaterally.  Normal effort ?Cardio: S1-S2 is normal regular.  No S3-S4.  No  rubs murmurs or bruit ?GI: Abdomen is soft.  Nontender nondistended.  Bowel sounds are present normal.  No masses organomegaly ?Extremities: No edema.  Full range of motion of lower extremities. ?Neurologic: Alert and oriented x3.  No focal neurological deficits.  ? ? ?Lab Results: ? ?Data Reviewed: I have personally reviewed labs and imaging study reports ? ?CBC: ?Recent Labs  ?Lab 05/21/21 ?1000 05/21/21 ?1007 05/22/21 ?[id number]  ?WBC 6.1  --  8.7  ?NEUTROABS 3.4  --   --    ?HGB 14.9 14.6 13.5  ?HCT 43.3 43.0 39.5  ?MCV 88.5  --  88.8  ?PLT 201  --  182  ? ? ?Basic Metabolic Panel: ?Recent Labs  ?Lab 05/21/21 ?1000 05/21/21 ?1007 05/22/21 ?[id number]  ?NA 141 142 138  ?K 4.1 4.0 3.9  ?CL 111 107 110  ?CO2 25  --  22  ?GLUCOSE 94 91 97  ?BUN 19 22* 14  ?CREATININE 0.99 0.90 1.06  ?CALCIUM 9.1  --  8.6*  ? ? ?GFR: ?Estimated Creatinine Clearance: 90.5 mL/min (by C-G formula based on SCr of 1.06 mg/dL). ? ?Liver Function Tests: ?Recent Labs  ?Lab 05/21/21 ?1000  ?AST 25  ?ALT 22  ?ALKPHOS 75  ?BILITOT 0.3  ?PROT 6.4*  ?ALBUMIN 3.7  ? ? ? ?Coagulation Profile: ?Recent Labs  ?Lab 05/21/21 ?1000  ?INR 1.0  ? ? ? ?HbA1C: ?Recent Labs  ?  05/21/21 ?1152  ?HGBA1C 5.6  ? ? ?CBG: ?Recent Labs  ?Lab 05/21/21 ?1031  ?GLUCAP 103*  ? ? ?Lipid Profile: ?Recent Labs  ?  05/21/21 ?1152 05/21/21 ?1206  ?CHOL  --  200  ?HDL  --  35*  ?LDLCALC  --  149*  ?TRIG  --  78  ?CHOLHDL  --  5.7  ?LDLDIRECT 146.8*  --   ? ? ? ?Radiology Studies: ?CT ANGIO HEAD NECK W WO CM ? ?Result Date: 05/21/2021 ?CLINICAL DATA:  56 year old male code stroke presentation. EXAM: CT ANGIOGRAPHY HEAD AND NECK TECHNIQUE: Multidetector CT imaging of the head and neck was performed using the standard protocol during bolus administration of intravenous contrast. Multiplanar CT image reconstructions and MIPs were obtained to evaluate the vascular anatomy. Carotid stenosis measurements (when applicable) are obtained utilizing NASCET criteria, using the distal internal carotid diameter as the denominator. RADIATION DOSE REDUCTION: This exam was performed according to the departmental dose-optimization program which includes automated exposure control, adjustment of the mA and/or kV according to patient size and/or use of iterative reconstruction technique. CONTRAST:  43mL OMNIPAQUE IOHEXOL 350 MG/ML SOLN COMPARISON:  Head CT 1010 hours today. FINDINGS: CTA NECK Skeleton: Mild motion artifact, especially at the mandible. No acute osseous  abnormality identified. Upper chest: Negative. Other neck: Mild motion artifact. No acute soft tissue findings in the neck. Aortic arch: 3 vessel arch configuration. No arch atherosclerosis. Right carotid system: Mild motion artifact. Mild tortuosity. No atherosclerosis or stenosis identified. Left carotid system: Mild motion artifact. Mild tortuosity. No atherosclerosis or stenosis identified. Evaluating thick MIP images of the bilateral cervical carotids there is a beaded appearance in addition to tortuosity more apparent on the right (series 12, image 16). But also visible on the left (series 11, image 23). Vertebral arteries: Normal proximal right subclavian artery and right vertebral artery origin. Highly tortuous right vertebral artery throughout the neck and to the skull base but no atherosclerosis or stenosis identified. Mild soft plaque in the proximal left subclavian artery with no significant stenosis. Normal left vertebral artery origin. Left vertebral  artery is dominant and tortuous to the skull base with no atherosclerosis or stenosis. CTA HEAD Posterior circulation: Dominant left V4 segment. No distal vertebral or vertebrobasilar junction plaque or stenosis. Patent PICA origins. Patent basilar artery without stenosis. Normal SCA and PCA origins. Small posterior communicating arteries. Bilateral PCA branches are within normal limits. Anterior circulation: Both ICA siphons are patent with no atherosclerosis or stenosis. Normal posterior communicating artery origins. Patent carotid termini. Normal MCA and ACA origins. Dominant left A1, the right is diminutive. Anterior communicating artery and bilateral ACA branches are within normal limits. Left MCA M1 segment bifurcates early without stenosis. Right MCA M1 segment is tortuous and bifurcates without stenosis. Bilateral MCA branches are within normal limits. Venous sinuses: Early contrast timing but the superior sagittal sinus, torcula, transverse and  sigmoid sinuses are enhancing and appear patent. Anatomic variants: Dominant left vertebral artery. Dominant left and diminutive right ACA A1 segment. Review of the MIP images confirms the above findings IMPRESSION: 1. Negati

## 2021-05-22 NOTE — H&P (View-Only) (Signed)
? ?[facility] ?PROGRESS NOTE ? ? ?Ian Golden [id number] DOB: May 12, 1965 DOA: 05/21/2021  1 ?DOS: the patient was seen and examined on 05/22/2021 ? ?PCP: Owens Loffler, MD ? ?Brief History and Hospital Course:  ?56 y.o. male with medical history significant of migraine headaches and seasonal allergies who presented after being noted to be confused on the morning of admission.  Apparently forgot a few things while he was in the kitchen.  Felt dizzy and lightheaded.  No syncopal episodes reported.  His wife took him to the fire department where he was noted to be hypertensive.  EMS was called.  Patient brought into the hospital.  Evaluation positive for stroke concerning for embolic in etiology.  Patient started on aspirin Plavix statin.  Hospitalized for further management. ? ?Consultants: Neurology ? ?Procedures: Transthoracic echocardiogram is pending ? ? ? ?Subjective: ?Patient mentions that he is feeling better this morning.  Confusion appears to have resolved.  Denies any headaches this morning.  No chest pain shortness of breath. ? ? ? ?Assessment/Plan: ? ? ?Acute CVA (cerebrovascular accident) ([place]) ?Patient presented with confusion and feeling lightheaded.   ?MRI revealed scattered acute infarct in bilateral cerebellar hemisphere.  Noted in occipital lobes as well ?Neurology consulted.   ?Patient noted to be on aspirin and Plavix.   ?PT OT SLP evaluation is pending.   ?LDL 149.  HbA1c 5.6.  EKG showed sinus rhythm. ?EEG did not show any epileptiform activity. ?Waiting on echocardiogram.   ?Neurology to reevaluate today. ?Patient reports history of blood clots in his mother who also has a history of prothrombin gene mutation.  Patient apparently was tested about 5 years ago and was told that he has a "slight risk." ? ?Hypertensive urgency ?Blood pressures initially elevated up to 180/97.  He is not on any medications for blood pressure at baseline. ?Allow permissive hypertension for  now. ? ?Hyperlipidemia ?LDL 149.  Started on statin. ? ?Sinus headache ?Patient complains of having sinus congestion and headache related to allergies.  Continue Claritin and Flonase. ? ? ? ? ?DVT Prophylaxis: Lovenox ?Code Status: Full code ?Family Communication: Discussed with patient ?Disposition Plan: Hopefully return home when improved ? ?Status is: Inpatient ?Remains inpatient appropriate because: Acute stroke ? ? ? ? ?Medications: Scheduled: ? aspirin  81 mg Oral Daily  ? atorvastatin  80 mg Oral Daily  ? clopidogrel  75 mg Oral Daily  ? enoxaparin (LOVENOX) injection  40 mg Subcutaneous Q24H  ? fluticasone  1-2 spray Each Nare Daily  ? guaiFENesin  600 mg Oral BID  ? loratadine  10 mg Oral Daily  ? ?Continuous: ?[id number] **OR** acetaminophen (TYLENOL) oral liquid 160 mg/5 mL **OR** acetaminophen, hydrALAZINE, senna-docusate ? ?Antibiotics: ?Anti-infectives (From admission, onward)  ? ? None  ? ?  ? ? ?Objective: ? ?Vital Signs ? ?Vitals:  ? 05/22/21 0230 05/22/21 0400 05/22/21 0600 05/22/21 0740  ?BP: (!) 141/86 129/84 127/75   ?Pulse: 63 62 61   ?Resp: 16 19 13    ?Temp:   98.2 ?F (36.8 ?C)   ?TempSrc:   Oral   ?SpO2: 96% 94% 96% 95%  ?Weight:      ?Height:      ? ?No intake or output data in the 24 hours ending 05/22/21 0844 ?Filed Weights  ? 05/21/21 1024  ?Weight: 83.9 kg  ? ? ?General appearance: Awake alert.  In no distress ?Resp: Clear to auscultation bilaterally.  Normal effort ?Cardio: S1-S2 is normal regular.  No S3-S4.  No  rubs murmurs or bruit ?GI: Abdomen is soft.  Nontender nondistended.  Bowel sounds are present normal.  No masses organomegaly ?Extremities: No edema.  Full range of motion of lower extremities. ?Neurologic: Alert and oriented x3.  No focal neurological deficits.  ? ? ?Lab Results: ? ?Data Reviewed: I have personally reviewed labs and imaging study reports ? ?CBC: ?Recent Labs  ?Lab 05/21/21 ?1000 05/21/21 ?1007 05/22/21 ?[id number]  ?WBC 6.1  --  8.7  ?NEUTROABS 3.4  --   --    ?HGB 14.9 14.6 13.5  ?HCT 43.3 43.0 39.5  ?MCV 88.5  --  88.8  ?PLT 201  --  182  ? ? ?Basic Metabolic Panel: ?Recent Labs  ?Lab 05/21/21 ?1000 05/21/21 ?1007 05/22/21 ?[id number]  ?NA 141 142 138  ?K 4.1 4.0 3.9  ?CL 111 107 110  ?CO2 25  --  22  ?GLUCOSE 94 91 97  ?BUN 19 22* 14  ?CREATININE 0.99 0.90 1.06  ?CALCIUM 9.1  --  8.6*  ? ? ?GFR: ?Estimated Creatinine Clearance: 90.5 mL/min (by C-G formula based on SCr of 1.06 mg/dL). ? ?Liver Function Tests: ?Recent Labs  ?Lab 05/21/21 ?1000  ?AST 25  ?ALT 22  ?ALKPHOS 75  ?BILITOT 0.3  ?PROT 6.4*  ?ALBUMIN 3.7  ? ? ? ?Coagulation Profile: ?Recent Labs  ?Lab 05/21/21 ?1000  ?INR 1.0  ? ? ? ?HbA1C: ?Recent Labs  ?  05/21/21 ?1152  ?HGBA1C 5.6  ? ? ?CBG: ?Recent Labs  ?Lab 05/21/21 ?1031  ?GLUCAP 103*  ? ? ?Lipid Profile: ?Recent Labs  ?  05/21/21 ?1152 05/21/21 ?1206  ?CHOL  --  200  ?HDL  --  35*  ?LDLCALC  --  149*  ?TRIG  --  78  ?CHOLHDL  --  5.7  ?LDLDIRECT 146.8*  --   ? ? ? ?Radiology Studies: ?CT ANGIO HEAD NECK W WO CM ? ?Result Date: 05/21/2021 ?CLINICAL DATA:  56 year old male code stroke presentation. EXAM: CT ANGIOGRAPHY HEAD AND NECK TECHNIQUE: Multidetector CT imaging of the head and neck was performed using the standard protocol during bolus administration of intravenous contrast. Multiplanar CT image reconstructions and MIPs were obtained to evaluate the vascular anatomy. Carotid stenosis measurements (when applicable) are obtained utilizing NASCET criteria, using the distal internal carotid diameter as the denominator. RADIATION DOSE REDUCTION: This exam was performed according to the departmental dose-optimization program which includes automated exposure control, adjustment of the mA and/or kV according to patient size and/or use of iterative reconstruction technique. CONTRAST:  35mL OMNIPAQUE IOHEXOL 350 MG/ML SOLN COMPARISON:  Head CT 1010 hours today. FINDINGS: CTA NECK Skeleton: Mild motion artifact, especially at the mandible. No acute osseous  abnormality identified. Upper chest: Negative. Other neck: Mild motion artifact. No acute soft tissue findings in the neck. Aortic arch: 3 vessel arch configuration. No arch atherosclerosis. Right carotid system: Mild motion artifact. Mild tortuosity. No atherosclerosis or stenosis identified. Left carotid system: Mild motion artifact. Mild tortuosity. No atherosclerosis or stenosis identified. Evaluating thick MIP images of the bilateral cervical carotids there is a beaded appearance in addition to tortuosity more apparent on the right (series 12, image 16). But also visible on the left (series 11, image 23). Vertebral arteries: Normal proximal right subclavian artery and right vertebral artery origin. Highly tortuous right vertebral artery throughout the neck and to the skull base but no atherosclerosis or stenosis identified. Mild soft plaque in the proximal left subclavian artery with no significant stenosis. Normal left vertebral artery origin. Left vertebral  artery is dominant and tortuous to the skull base with no atherosclerosis or stenosis. CTA HEAD Posterior circulation: Dominant left V4 segment. No distal vertebral or vertebrobasilar junction plaque or stenosis. Patent PICA origins. Patent basilar artery without stenosis. Normal SCA and PCA origins. Small posterior communicating arteries. Bilateral PCA branches are within normal limits. Anterior circulation: Both ICA siphons are patent with no atherosclerosis or stenosis. Normal posterior communicating artery origins. Patent carotid termini. Normal MCA and ACA origins. Dominant left A1, the right is diminutive. Anterior communicating artery and bilateral ACA branches are within normal limits. Left MCA M1 segment bifurcates early without stenosis. Right MCA M1 segment is tortuous and bifurcates without stenosis. Bilateral MCA branches are within normal limits. Venous sinuses: Early contrast timing but the superior sagittal sinus, torcula, transverse and  sigmoid sinuses are enhancing and appear patent. Anatomic variants: Dominant left vertebral artery. Dominant left and diminutive right ACA A1 segment. Review of the MIP images confirms the above findings IMPRESSION: 1. Negati

## 2021-05-22 NOTE — Evaluation (Signed)
Physical Therapy Evaluation ?Patient Details ?Name: Ian Golden ?MRN: [id number] ?DOB: 07-27-1965 ?Today's Date: 05/22/2021 ? ?History of Present Illness ? Pt is a 56 y/o male admitted secondary to dizziness. Found to have hypertensive urgency. Imaging revealed bilateral cerebellar and occipital lobe infarcts.  ?Clinical Impression ? Pt admitted secondary to problem above with deficits below. Pt requiring min guard A for mobility tasks. Reporting wooziness throughout. Practiced compensation techniques (focusing on stationary object, etc) during mobility tasks. Recommending outpatient PT at d/c to address current deficits. Wife reports she can assist at home. Will continue to follow acutely.    ?   ? ?Recommendations for follow up therapy are one component of a multi-disciplinary discharge planning process, led by the attending physician.  Recommendations may be updated based on patient status, additional functional criteria and insurance authorization. ? ?Follow Up Recommendations Outpatient PT (neuro outpatient) ? ?  ?Assistance Recommended at Discharge Frequent or constant Supervision/Assistance  ?Patient can return home with the following ? Assistance with cooking/housework;Help with stairs or ramp for entrance;Assist for transportation ? ?  ?Equipment Recommendations Other (comment) (TBD pending progression)  ?Recommendations for Other Services ?    ?  ?Functional Status Assessment Patient has had a recent decline in their functional status and demonstrates the ability to make significant improvements in function in a reasonable and predictable amount of time.  ? ?  ?Precautions / Restrictions Precautions ?Precautions: Fall ?Restrictions ?Weight Bearing Restrictions: No  ? ?  ? ?Mobility ? Bed Mobility ?Overal bed mobility: Needs Assistance ?Bed Mobility: Supine to Sit, Sit to Supine ?  ?  ?Supine to sit: Min guard ?Sit to supine: Min guard ?  ?General bed mobility comments: Min guard for safety. reports some  dizziness upon sitting. ?  ? ?Transfers ?Overall transfer level: Needs assistance ?Equipment used: None ?Transfers: Sit to/from Stand ?Sit to Stand: Min guard ?  ?  ?  ?  ?  ?General transfer comment: min guard for safety. ?  ? ?Ambulation/Gait ?Ambulation/Gait assistance: Min guard ?Gait Distance (Feet): 20 Feet ?Assistive device: None ?Gait Pattern/deviations: Step-through pattern, Decreased stride length ?Gait velocity: Decreased ?  ?  ?General Gait Details: Very slow cautious gait when taking laps in the room. Educated about focusing on objects, especially when turning to help with "wooziness". Pt reports legs "felt heavy". ? ?Stairs ?  ?  ?  ?  ?  ? ?Wheelchair Mobility ?  ? ?Modified Rankin (Stroke Patients Only) ?Modified Rankin (Stroke Patients Only) ?Pre-Morbid Rankin Score: No symptoms ?Modified Rankin: Moderately severe disability ? ?  ? ?Balance Overall balance assessment: Needs assistance ?Sitting-balance support: No upper extremity supported, Feet supported ?Sitting balance-Leahy Scale: Fair ?  ?  ?Standing balance support: No upper extremity supported ?Standing balance-Leahy Scale: Fair ?  ?  ?  ?  ?  ?  ?  ?  ?  ?  ?  ?  ?   ? ? ? ?Pertinent Vitals/Pain Pain Assessment ?Pain Assessment: Faces ?Faces Pain Scale: Hurts little more ?Pain Location: neck and back of head ?Pain Descriptors / Indicators: Grimacing, Guarding ?Pain Intervention(s): Limited activity within patient's tolerance, Monitored during session, Repositioned  ? ? ?Home Living Family/patient expects to be discharged to:: Private residence ?Living Arrangements: Spouse/significant other ?Available Help at Discharge: Family ?Type of Home: House ?Home Access: Stairs to enter ?Entrance Stairs-Rails: Right ?Entrance Stairs-Number of Steps: 3 ?Alternate Level Stairs-Number of Steps: flight ?Home Layout: Two level ?Home Equipment: Shower seat ?   ?  ?Prior Function  Prior Level of Function : Independent/Modified Independent;Driving ?  ?  ?  ?  ?   ?  ?  ?  ?  ? ? ?Hand Dominance  ?   ? ?  ?Extremity/Trunk Assessment  ? Upper Extremity Assessment ?Upper Extremity Assessment: Defer to OT evaluation ?  ? ?Lower Extremity Assessment ?Lower Extremity Assessment: Generalized weakness ?  ? ?Cervical / Trunk Assessment ?Cervical / Trunk Assessment: Normal  ?Communication  ? Communication: No difficulties  ?Cognition Arousal/Alertness: Awake/alert ?Behavior During Therapy: [facility] for tasks assessed/performed ?Overall Cognitive Status: Impaired/Different from baseline ?Area of Impairment: Memory ?  ?  ?  ?  ?  ?  ?  ?  ?  ?  ?Memory: Decreased short-term memory ?  ?  ?  ?  ?General Comments: No recall of events prior to admission ?  ?  ? ?  ?General Comments   ? ?  ?Exercises    ? ?Assessment/Plan  ?  ?PT Assessment Patient needs continued PT services  ?PT Problem List Decreased strength;Decreased activity tolerance;Decreased balance;Decreased mobility;Decreased cognition;Decreased knowledge of use of DME ? ?   ?  ?PT Treatment Interventions Gait training;DME instruction;Stair training;Therapeutic activities;Functional mobility training;Balance training;Therapeutic exercise;Patient/family education   ? ?PT Goals (Current goals can be found in the Care Plan section)  ?Acute Rehab PT Goals ?Patient Stated Goal: to be independent ?PT Goal Formulation: With patient ?Time For Goal Achievement: 06/05/21 ?Potential to Achieve Goals: Good ? ?  ?Frequency Min 4X/week ?  ? ? ?Co-evaluation   ?  ?  ?  ?  ? ? ?  ?AM-PAC PT "6 Clicks" Mobility  ?Outcome Measure Help needed turning from your back to your side while in a flat bed without using bedrails?: A Little ?Help needed moving from lying on your back to sitting on the side of a flat bed without using bedrails?: A Little ?Help needed moving to and from a bed to a chair (including a wheelchair)?: A Little ?Help needed standing up from a chair using your arms (e.g., wheelchair or bedside chair)?: A Little ?Help needed to walk in  hospital room?: A Little ?Help needed climbing 3-5 steps with a railing? : A Lot ?6 Click Score: 17 ? ?  ?End of Session Equipment Utilized During Treatment: Gait belt ?Activity Tolerance: Patient tolerated treatment well ?Patient left: in bed;with call bell/phone within reach (on stretcher in ED) ?Nurse Communication: Mobility status ?PT Visit Diagnosis: Unsteadiness on feet (R26.81);Muscle weakness (generalized) (M62.81);Dizziness and giddiness (R42) ?  ? ?Time: 1203-1223 ?PT Time Calculation (min) (ACUTE ONLY): 20 min ? ? ?Charges:   PT Evaluation ?$PT Eval Moderate Complexity: 1 Mod ?  ?  ?   ? ? ?Reuel Derby, PT, DPT  ?Acute Rehabilitation Services  ?Pager: [phone] ?Office: [phone] ? ? ?[place] ?05/22/2021, 1:45 PM ? ?

## 2021-05-22 NOTE — Plan of Care (Signed)
  Problem: Education: Goal: Knowledge of General Education information will improve Description: Including pain rating scale, medication(s)/side effects and non-pharmacologic comfort measures Outcome: Progressing   Problem: Health Behavior/Discharge Planning: Goal: Ability to manage health-related needs will improve Outcome: Progressing   Problem: Clinical Measurements: Goal: Ability to maintain clinical measurements within normal limits will improve Outcome: Progressing Goal: Will remain free from infection Outcome: Progressing Goal: Diagnostic test results will improve Outcome: Progressing Goal: Respiratory complications will improve Outcome: Progressing Goal: Cardiovascular complication will be avoided Outcome: Progressing   Problem: Activity: Goal: Risk for activity intolerance will decrease Outcome: Progressing   Problem: Nutrition: Goal: Adequate nutrition will be maintained Outcome: Progressing   Problem: Coping: Goal: Level of anxiety will decrease Outcome: Progressing   Problem: Elimination: Goal: Will not experience complications related to bowel motility Outcome: Progressing Goal: Will not experience complications related to urinary retention Outcome: Progressing   Problem: Pain Managment: Goal: General experience of comfort will improve Outcome: Progressing   Problem: Safety: Goal: Ability to remain free from injury will improve Outcome: Progressing   Problem: Skin Integrity: Goal: Risk for impaired skin integrity will decrease Outcome: Progressing   Problem: Education: Goal: Knowledge of disease or condition will improve Outcome: Progressing Goal: Knowledge of secondary prevention will improve (SELECT ALL) Outcome: Progressing Goal: Knowledge of patient specific risk factors will improve (INDIVIDUALIZE FOR PATIENT) Outcome: Progressing   Problem: Coping: Goal: Will verbalize positive feelings about self Outcome: Progressing Goal: Will  identify appropriate support needs Outcome: Progressing   Problem: Health Behavior/Discharge Planning: Goal: Ability to manage health-related needs will improve Outcome: Progressing   Problem: Self-Care: Goal: Ability to participate in self-care as condition permits will improve Outcome: Progressing Goal: Verbalization of feelings and concerns over difficulty with self-care will improve Outcome: Progressing Goal: Ability to communicate needs accurately will improve Outcome: Progressing   Problem: Nutrition: Goal: Risk of aspiration will decrease Outcome: Progressing Goal: Dietary intake will improve Outcome: Progressing   

## 2021-05-22 NOTE — Progress Notes (Signed)
?  Echocardiogram ?2D Echocardiogram has been performed. ? ?Delcie Roch ?05/22/2021, 9:38 AM ?

## 2021-05-22 NOTE — ED Notes (Signed)
Neuro MD at bedside

## 2021-05-22 NOTE — ED Notes (Signed)
ED TO INPATIENT HANDOFF REPORT ? ?ED Nurse Name and Phone #: [phone] ? ?S ?Name/Age/Gender ?Ian Golden ?56 y.o. ?male ?Room/Bed: 039C/039C ? ?Code Status ?  Code Status: Full Code ? ?Home/SNF/Other ?Home ?Patient oriented to: self, place, time, and situation ?Is this baseline? Yes  ? ?Triage Complete: Triage complete  ?Chief Complaint ?Global amnesia [R41.3] ?CVA (cerebral vascular accident) [facility]) [I63.9] ? ?Triage Note ?Pt BIB [facility] from home as Code Stroke d/t wife stating that he began c/o being dizzy, having HA & asking repetitive questions. LKN was 0800 & EMS reports that his initial BP was 200/100, CBG 125, monitor showed NSR & 62 bpm, 98% on RA. 18g PIV in Lt AC, while en route to ED his BP went down to 170/90.   ? ?Allergies ?No Known Allergies ? ?Level of Care/Admitting Diagnosis ?ED Disposition   ? ? ED Disposition  ?Admit  ? Condition  ?--  ? Comment  ?Hospital Area: [facility] [[id number]] ? Level of Care: Telemetry Medical [104] ? May admit patient to [facility] or [facility] if equivalent level of care is available:: No ? Covid Evaluation: Asymptomatic - no recent exposure (last 10 days) testing not required ? Diagnosis: CVA (cerebral vascular accident) [facility]) [[id number]] ? Admitting Physician: Clydie Braun [[id number]] ? Attending Physician: Clydie Braun [[id number]] ? Estimated length of stay: past midnight tomorrow ? Certification:: I certify this patient will need inpatient services for at least 2 midnights ?  ?  ? ?  ? ? ?B ?Medical/Surgery History ?Past Medical History:  ?Diagnosis Date  ? Allergic rhinitis, cause unspecified   ? Hemorrhoids   ? Migraine   ? Ulcer   ? ?Past Surgical History:  ?Procedure Laterality Date  ? WISDOM TOOTH EXTRACTION    ? with sedation  ?  ? ?A ?IV Location/Drains/Wounds ?Patient Lines/Drains/Airways Status   ? ? Active Line/Drains/Airways   ? ? Name Placement date Placement time Site Days  ? Peripheral IV 05/21/21 18 G Left Antecubital 05/21/21  1015   Antecubital  1  ? ?  ?  ? ?  ? ? ?Intake/Output Last 24 hours ?No intake or output data in the 24 hours ending 05/22/21 1304 ? ?Labs/Imaging ?Results for orders placed or performed during the hospital encounter of 05/21/21 (from the past 48 hour(s))  ?Protime-INR     Status: None  ? Collection Time: 05/21/21 10:00 AM  ?Result Value Ref Range  ? Prothrombin Time 13.5 11.4 - 15.2 seconds  ? INR 1.0 0.8 - 1.2  ?  Comment: (NOTE) ?INR goal varies based on device and disease states. ?Performed at [facility], [address], [place], [place] ?[address] ?  ?APTT     Status: None  ? Collection Time: 05/21/21 10:00 AM  ?Result Value Ref Range  ? aPTT 26 24 - 36 seconds  ?  Comment: Performed at [facility], [address] [place], [place] [address]  ?CBC     Status: None  ? Collection Time: 05/21/21 10:00 AM  ?Result Value Ref Range  ? WBC 6.1 4.0 - 10.5 K/uL  ? RBC 4.89 4.22 - 5.81 MIL/uL  ? Hemoglobin 14.9 13.0 - 17.0 g/dL  ? HCT 43.3 39.0 - 52.0 %  ? MCV 88.5 80.0 - 100.0 fL  ? MCH 30.5 26.0 - 34.0 pg  ? MCHC 34.4 30.0 - 36.0 g/dL  ? RDW 12.4 11.5 - 15.5 %  ? Platelets 201 150 - 400  K/uL  ? nRBC 0.0 0.0 - 0.2 %  ?  Comment: Performed at [facility], [address], [place], [place] [address]  ?Differential     Status: Abnormal  ? Collection Time: 05/21/21 10:00 AM  ?Result Value Ref Range  ? Neutrophils Relative % 56 %  ? Neutro Abs 3.4 1.7 - 7.7 K/uL  ? Lymphocytes Relative 26 %  ? Lymphs Abs 1.6 0.7 - 4.0 K/uL  ? Monocytes Relative 7 %  ? Monocytes Absolute 0.5 0.1 - 1.0 K/uL  ? Eosinophils Relative 10 %  ? Eosinophils Absolute 0.6 (H) 0.0 - 0.5 K/uL  ? Basophils Relative 1 %  ? Basophils Absolute 0.0 0.0 - 0.1 K/uL  ? Immature Granulocytes 0 %  ? Abs Immature Granulocytes 0.02 0.00 - 0.07 K/uL  ?  Comment: Performed at [facility], [address], [place], [place] [address]  ?Comprehensive metabolic panel     Status: Abnormal  ? Collection Time: 05/21/21 10:00 AM  ?Result Value Ref Range   ? Sodium 141 135 - 145 mmol/L  ? Potassium 4.1 3.5 - 5.1 mmol/L  ? Chloride 111 98 - 111 mmol/L  ? CO2 25 22 - 32 mmol/L  ? Glucose, Bld 94 70 - 99 mg/dL  ?  Comment: Glucose reference range applies only to samples taken after fasting for at least 8 hours.  ? BUN 19 6 - 20 mg/dL  ? Creatinine, Ser 0.99 0.61 - 1.24 mg/dL  ? Calcium 9.1 8.9 - 10.3 mg/dL  ? Total Protein 6.4 (L) 6.5 - 8.1 g/dL  ? Albumin 3.7 3.5 - 5.0 g/dL  ? AST 25 15 - 41 U/L  ? ALT 22 0 - 44 U/L  ? Alkaline Phosphatase 75 38 - 126 U/L  ? Total Bilirubin 0.3 0.3 - 1.2 mg/dL  ? GFR, Estimated >60 >60 mL/min  ?  Comment: (NOTE) ?Calculated using the CKD-EPI Creatinine Equation (2021) ?  ? Anion gap 5 5 - 15  ?  Comment: Performed at [facility], [address] [place], [place] [address]  ?I-stat chem 8, ED     Status: Abnormal  ? Collection Time: 05/21/21 10:07 AM  ?Result Value Ref Range  ? Sodium 142 135 - 145 mmol/L  ? Potassium 4.0 3.5 - 5.1 mmol/L  ? Chloride 107 98 - 111 mmol/L  ? BUN 22 (H) 6 - 20 mg/dL  ? Creatinine, Ser 0.90 0.61 - 1.24 mg/dL  ? Glucose, Bld 91 70 - 99 mg/dL  ?  Comment: Glucose reference range applies only to samples taken after fasting for at least 8 hours.  ? Calcium, Ion 1.18 1.15 - 1.40 mmol/L  ? TCO2 24 22 - 32 mmol/L  ? Hemoglobin 14.6 13.0 - 17.0 g/dL  ? HCT 43.0 39.0 - 52.0 %  ?CBG monitoring, ED     Status: Abnormal  ? Collection Time: 05/21/21 10:31 AM  ?Result Value Ref Range  ? Glucose-Capillary 103 (H) 70 - 99 mg/dL  ?  Comment: Glucose reference range applies only to samples taken after fasting for at least 8 hours.  ?Hemoglobin A1c     Status: None  ? Collection Time: 05/21/21 11:52 AM  ?Result Value Ref Range  ? Hgb A1c MFr Bld 5.6 4.8 - 5.6 %  ?  Comment: (NOTE) ?Pre diabetes:          5.7%-6.4% ? ?Diabetes:              >  6.4% ? ?Glycemic control for   <7.0% ?adults with diabetes ?  ? Mean Plasma Glucose 114.02 mg/dL  ?  Comment: Performed at [facility], [address] [place], [place]  [address]  ?LDL cholesterol, direct     Status: Abnormal  ? Collection Time: 05/21/21 11:52 AM  ?Result Value Ref Range  ? Direct LDL 146.8 (H) 0 - 99 mg/dL  ?  Comment: Performed at [facility], [address] [place], [place] [address]  ?HIV Antibody (routine testing w rflx)     Status: None  ? Collection Time: 05/21/21 11:52 AM  ?Result Value Ref Range  ? HIV Screen 4th Generation wRfx Non Reactive Non Reactive  ?  Comment: Performed at [facility], [address] [place], [place] [address]  ?Lipid panel     Status: Abnormal  ? Collection Time: 05/21/21 12:06 PM  ?Result Value Ref Range  ? Cholesterol 200 0 - 200 mg/dL  ? Triglycerides 78 <150 mg/dL  ? HDL 35 (L) >40 mg/dL  ? Total CHOL/HDL Ratio 5.7 RATIO  ? VLDL 16 0 - 40 mg/dL  ? LDL Cholesterol 149 (H) 0 - 99 mg/dL  ?  Comment:        ?Total Cholesterol/HDL:CHD Risk ?Coronary Heart Disease Risk Table ?                    Men   Women ? 1/2 Average Risk   3.4   3.3 ? Average Risk       5.0   4.4 ? 2 X Average Risk   9.6   7.1 ? 3 X Average Risk  23.4   11.0 ?       ?Use the calculated Patient Ratio ?above and the CHD Risk Table ?to determine the patient's CHD Risk. ?       ?ATP III CLASSIFICATION (LDL): ? <100     mg/dL   Optimal ? [id number]  mg/dL   Near or Above ?                   Optimal ? 130-159  mg/dL   Borderline ? 160-189  mg/dL   High ? >010>190     mg/dL   Very High ?Performed at [facility], [address] [place], [place] [address] ?  ?CBC     Status: None  ? Collection Time: 05/22/21  5:45 AM  ?Result Value Ref Range  ? WBC 8.7 4.0 - 10.5 K/uL  ? RBC 4.45 4.22 - 5.81 MIL/uL  ? Hemoglobin 13.5 13.0 - 17.0 g/dL  ? HCT 39.5 39.0 - 52.0 %  ? MCV 88.8 80.0 - 100.0 fL  ? MCH 30.3 26.0 - 34.0 pg  ? MCHC 34.2 30.0 - 36.0 g/dL  ? RDW 12.4 11.5 - 15.5 %  ? Platelets 182 150 - 400 K/uL  ? nRBC 0.0 0.0 - 0.2 %  ?  Comment: Performed at [facility], [address] [place], [place] [address]  ?Basic metabolic panel     Status: Abnormal  ?  Collection Time: 05/22/21  5:45 AM  ?Result Value Ref Range  ? Sodium 138 135 - 145 mmol/L  ? Potassium 3.9 3.5 - 5.1 mmol/L  ? Chloride 110 98 - 111 mmol/L  ? CO2 22 22 - 32 mmol/L  ? Glucose, Bld 97

## 2021-05-22 NOTE — Progress Notes (Signed)
?  Transition of Care (TOC) Screening Note ? ? ?Patient Details  ?Name: Ian Golden ?Date of Birth: Jul 05, 1965 ? ? ?Transition of Care (TOC) CM/SW Contact:    ?Cyndi Bender, RN ?Phone Number: ?05/22/2021, 2:34 PM ? ? ? ?Transition of Care Department [facility]) has reviewed patient. ?PT recommends outpt rehab. Awaiting OT recommendation. If outpt rehab wife wants [address]. ?We will continue to monitor patient advancement through interdisciplinary progression rounds.  ?

## 2021-05-22 NOTE — Hospital Course (Signed)
56 y.o. male with medical history significant of migraine headaches and seasonal allergies who presented after being noted to be confused on the morning of admission.  Apparently forgot a few things while he was in the kitchen.  Felt dizzy and lightheaded.  No syncopal episodes reported.  His wife took him to the fire department where he was noted to be hypertensive.  EMS was called.  Patient brought into the hospital.  Evaluation positive for stroke concerning for embolic in etiology.  Patient started on aspirin Plavix statin.  Hospitalized for further management. ?

## 2021-05-22 NOTE — Progress Notes (Addendum)
STROKE TEAM PROGRESS NOTE  ? ?INTERVAL HISTORY ?His wife is at the bedside.  Patient does not remember anything from last 1-2 days.  Patient does still report not being able to remember anything that happened on Sunday.  He remembers the family trip to [place] on Saturday afternoon and then waking up in the hospital overnight.  Per his wife on Sunday he forgot a few things while he was in the kitchen and she told him to go lay down. He stated that he felt dizzy and lightheaded. His wife took him to the fire department where he was noted to be hypertensive.  EMS was called and he was brought to the emergency department as a Code Stroke.  LDL 149, hemoglobin A1c 5.6.  Homocystine, cardiolipin antibodies pending.  TEE and loop recorder tomorrow ? ?Vitals:  ? 05/22/21 0230 05/22/21 0400 05/22/21 0600 05/22/21 0740  ?BP: (!) 141/86 129/84 127/75   ?Pulse: 63 62 61   ?Resp: 16 19 13    ?Temp:   98.2 ?F (36.8 ?C)   ?TempSrc:   Oral   ?SpO2: 96% 94% 96% 95%  ?Weight:      ?Height:      ? ?CBC:  ?Recent Labs  ?Lab 05/21/21 ?1000 05/21/21 ?1007 05/22/21 ?[id number]  ?WBC 6.1  --  8.7  ?NEUTROABS 3.4  --   --   ?HGB 14.9 14.6 13.5  ?HCT 43.3 43.0 39.5  ?MCV 88.5  --  88.8  ?PLT 201  --  182  ? ?Basic Metabolic Panel:  ?Recent Labs  ?Lab 05/21/21 ?1000 05/21/21 ?1007 05/22/21 ?[id number]  ?NA 141 142 138  ?K 4.1 4.0 3.9  ?CL 111 107 110  ?CO2 25  --  22  ?GLUCOSE 94 91 97  ?BUN 19 22* 14  ?CREATININE 0.99 0.90 1.06  ?CALCIUM 9.1  --  8.6*  ? ?Lipid Panel:  ?Recent Labs  ?Lab 05/21/21 ?1206  ?CHOL 200  ?TRIG 78  ?HDL 35*  ?CHOLHDL 5.7  ?VLDL 16  ?LDLCALC 149*  ? ?HgbA1c:  ?Recent Labs  ?Lab 05/21/21 ?1152  ?HGBA1C 5.6  ? ?Urine Drug Screen: No results for input(s): LABOPIA, COCAINSCRNUR, LABBENZ, AMPHETMU, THCU, LABBARB in the last 168 hours.  ?Alcohol Level No results for input(s): ETH in the last 168 hours. ? ?IMAGING past 24 hours ?CT ANGIO HEAD NECK W WO CM ? ?Result Date: 05/21/2021 ?CLINICAL DATA:  56 year old male code stroke  presentation. EXAM: CT ANGIOGRAPHY HEAD AND NECK TECHNIQUE: Multidetector CT imaging of the head and neck was performed using the standard protocol during bolus administration of intravenous contrast. Multiplanar CT image reconstructions and MIPs were obtained to evaluate the vascular anatomy. Carotid stenosis measurements (when applicable) are obtained utilizing NASCET criteria, using the distal internal carotid diameter as the denominator. RADIATION DOSE REDUCTION: This exam was performed according to the departmental dose-optimization program which includes automated exposure control, adjustment of the mA and/or kV according to patient size and/or use of iterative reconstruction technique. CONTRAST:  31mL OMNIPAQUE IOHEXOL 350 MG/ML SOLN COMPARISON:  Head CT 1010 hours today. FINDINGS: CTA NECK Skeleton: Mild motion artifact, especially at the mandible. No acute osseous abnormality identified. Upper chest: Negative. Other neck: Mild motion artifact. No acute soft tissue findings in the neck. Aortic arch: 3 vessel arch configuration. No arch atherosclerosis. Right carotid system: Mild motion artifact. Mild tortuosity. No atherosclerosis or stenosis identified. Left carotid system: Mild motion artifact. Mild tortuosity. No atherosclerosis or stenosis identified. Evaluating thick MIP images of the bilateral  cervical carotids there is a beaded appearance in addition to tortuosity more apparent on the right (series 12, image 16). But also visible on the left (series 11, image 23). Vertebral arteries: Normal proximal right subclavian artery and right vertebral artery origin. Highly tortuous right vertebral artery throughout the neck and to the skull base but no atherosclerosis or stenosis identified. Mild soft plaque in the proximal left subclavian artery with no significant stenosis. Normal left vertebral artery origin. Left vertebral artery is dominant and tortuous to the skull base with no atherosclerosis or  stenosis. CTA HEAD Posterior circulation: Dominant left V4 segment. No distal vertebral or vertebrobasilar junction plaque or stenosis. Patent PICA origins. Patent basilar artery without stenosis. Normal SCA and PCA origins. Small posterior communicating arteries. Bilateral PCA branches are within normal limits. Anterior circulation: Both ICA siphons are patent with no atherosclerosis or stenosis. Normal posterior communicating artery origins. Patent carotid termini. Normal MCA and ACA origins. Dominant left A1, the right is diminutive. Anterior communicating artery and bilateral ACA branches are within normal limits. Left MCA M1 segment bifurcates early without stenosis. Right MCA M1 segment is tortuous and bifurcates without stenosis. Bilateral MCA branches are within normal limits. Venous sinuses: Early contrast timing but the superior sagittal sinus, torcula, transverse and sigmoid sinuses are enhancing and appear patent. Anatomic variants: Dominant left vertebral artery. Dominant left and diminutive right ACA A1 segment. Review of the MIP images confirms the above findings IMPRESSION: 1. Negative for large vessel occlusion.  Mild motion artifact. 2. Minimal atherosclerosis in the head or neck with no arterial stenosis identified. But positive for bilateral cervical ICA Fibromuscular Dysplasia (FMD). These results were communicated to Dr. Quinn Axe at 12:12 pm on 05/21/2021 by text page via the [facility] messaging system. Electronically Signed   By: Genevie Ann M.D.   On: 05/21/2021 12:18  ? ?MR BRAIN WO CONTRAST ? ?Result Date: 05/21/2021 ?CLINICAL DATA:  Transient global amnesia, stroke suspected EXAM: MRI HEAD WITHOUT CONTRAST TECHNIQUE: Multiplanar, multiecho pulse sequences of the brain and surrounding structures were obtained without intravenous contrast. COMPARISON:  05/21/2021 CTA, 12/02/2019 MRI FINDINGS: Brain: Scattered, mostly punctate foci of restricted diffusion with ADC correlates in the bilateral cerebellar  hemispheres and occipital lobes (series 5, images 62-85). No acute hemorrhage, mass, mass effect, or midline shift. No hydrocephalus or extra-axial collection. No abnormal parenchymal or meningeal enhancement. Vascular: Normal flow voids. Skull and upper cervical spine: Normal marrow signal. Sinuses/Orbits: Mucosal thickening in the ethmoid air cells. Otherwise negative. Other: The mastoids are well aerated. IMPRESSION: Scattered acute infarcts in the bilateral cerebellar hemispheres and occipital lobes. Imaging results were communicated on 05/21/2021 at 1:42 pm to provider Dr. Quinn Axe via secure text paging. Electronically Signed   By: Merilyn Baba M.D.   On: 05/21/2021 13:42  ? ?CT HEAD CODE STROKE WO CONTRAST ? ?Result Date: 05/21/2021 ?CLINICAL DATA:  Code stroke.  57 year old male EXAM: CT HEAD WITHOUT CONTRAST TECHNIQUE: Contiguous axial images were obtained from the base of the skull through the vertex without intravenous contrast. RADIATION DOSE REDUCTION: This exam was performed according to the departmental dose-optimization program which includes automated exposure control, adjustment of the mA and/or kV according to patient size and/or use of iterative reconstruction technique. COMPARISON:  Brain MRI 12/02/2019. FINDINGS: Brain: Normal cerebral volume. No midline shift, ventriculomegaly, mass effect, evidence of mass lesion, intracranial hemorrhage or evidence of cortically based acute infarction. Gray-white matter differentiation is within normal limits throughout the brain. Vascular: No suspicious intracranial vascular hyperdensity. Skull: TMJ  motion artifact. No acute osseous abnormality identified. Sinuses/Orbits: Visualized paranasal sinuses and mastoids are stable and well aerated. Other: Visualized orbits and scalp soft tissues are within normal limits. ASPECTS [facility] Stroke Program Early CT Score) Total score (0-10 with 10 being normal): 10 IMPRESSION: 1. Normal noncontrast CT appearance of the  brain.  ASPECTS 10. 2. These results were communicated to Dr. Quinn Axe at 10:08 am on 05/21/2021 by text page via the [facility] messaging system. Electronically Signed   By: Genevie Ann M.D.   On: 05/21/2021 10:13   ? ?PHYSICAL EXAM

## 2021-05-22 NOTE — Progress Notes (Signed)
Patient arrived to 5W31 in NAD, VS stable. Patient oriented to room and call bell in reach. Wife at bedside. ?

## 2021-05-22 NOTE — Procedures (Signed)
History: 56 yo M with memory loss ? ?Sedation: none ? ?Technique: This EEG was acquired with electrodes placed according to the International 10-20 electrode system (including Fp1, Fp2, F3, F4, C3, C4, P3, P4, O1, O2, T3, T4, T5, T6, A1, A2, Fz, Cz, Pz). The following electrodes were missing or displaced: none. ? ? ?Background: The background consists of intermixed alpha and beta activities. There is a well defined posterior dominant rhythm of 8-9 Hz that attenuates with eye opening. Sleep is recorded with normal appearing structures.  ? ?Photic stimulation: Physiologic driving is not performed ? ?EEG Abnormalities: none ? ?Clinical Interpretation: This normal EEG is recorded in the waking and sleep state. There was no seizure or seizure predisposition recorded on this study. Please note that lack of epileptiform activity on EEG does not preclude the possibility of epilepsy.  ? ?Ritta Slot, MD ?[facility] ?[phone] ? ?If 7pm- 7am, please page neurology on call as listed in AMION. ? ?

## 2021-05-22 NOTE — Progress Notes (Signed)
? ? ?  Shared Decision Making/Informed Consent ? ?The risks [esophageal damage, perforation (1:10,000 risk), bleeding, pharyngeal hematoma as well as other potential complications associated with conscious sedation including aspiration, arrhythmia, respiratory failure and death], benefits (treatment guidance and diagnostic support) and alternatives of a transesophageal echocardiogram were discussed in detail with Ian Golden and he is willing to proceed.   ? ?NPO after midnight. TEE scheduled on 2pm on 05/23/21.  ?

## 2021-05-22 NOTE — Assessment & Plan Note (Addendum)
Patient presented with confusion and feeling lightheaded.   ?MRI revealed scattered acute infarct in bilateral cerebellar hemisphere.  Noted in occipital lobes as well ?Neurology consulted.   ?Patient started on aspirin and Plavix.   ?LDL 149.  HbA1c 5.6.  EKG showed sinus rhythm. ?EEG did not show any epileptiform activity. ?Echocardiogram showed normal systolic function.  No shunt was noted. ?Patient seen by neurology.  TEE has been recommended.  It was completed this morning and showed EF to be 60%.  No thrombus.  No PFO.  Plan is to proceed with loop recorder placement. ?Seen by PT and outpatient PT is recommended.  Ambulatory referral was sent.  ?OT evaluation is pending. ?Patient reports history of blood clots in his mother who also has a history of prothrombin gene mutation.  Patient apparently was tested about 5 years ago and was told that he has a "slight risk." ?Neurology recommends aspirin and Plavix for 3 weeks followed by aspirin alone.  Patient also started on statin. ?

## 2021-05-22 NOTE — ED Notes (Signed)
Admitting MD at bedside.

## 2021-05-22 NOTE — ED Notes (Signed)
Pt transported to vascular.  °

## 2021-05-23 ENCOUNTER — Inpatient Hospital Stay (HOSPITAL_COMMUNITY): Payer: 59

## 2021-05-23 ENCOUNTER — Encounter (HOSPITAL_COMMUNITY): Payer: Self-pay | Admitting: Cardiovascular Disease

## 2021-05-23 ENCOUNTER — Encounter (HOSPITAL_COMMUNITY)
Admission: EM | Disposition: A | Discharge status: Home / Self Care | Payer: Self-pay | Source: Home / Self Care | Attending: Internal Medicine

## 2021-05-23 ENCOUNTER — Inpatient Hospital Stay (HOSPITAL_COMMUNITY): Payer: 59 | Admitting: General Practice

## 2021-05-23 DIAGNOSIS — I639 Cerebral infarction, unspecified: Secondary | ICD-10-CM

## 2021-05-23 DIAGNOSIS — Z8673 Personal history of transient ischemic attack (TIA), and cerebral infarction without residual deficits: Secondary | ICD-10-CM

## 2021-05-23 DIAGNOSIS — I10 Essential (primary) hypertension: Secondary | ICD-10-CM

## 2021-05-23 DIAGNOSIS — I340 Nonrheumatic mitral (valve) insufficiency: Secondary | ICD-10-CM

## 2021-05-23 HISTORY — PX: BUBBLE STUDY: SHX6837

## 2021-05-23 HISTORY — PX: TEE WITHOUT CARDIOVERSION: SHX5443

## 2021-05-23 HISTORY — PX: LOOP RECORDER INSERTION: EP1214

## 2021-05-23 LAB — HOMOCYSTEINE: Homocysteine: 9.4 umol/L (ref 0.0–14.5)

## 2021-05-23 LAB — TSH: TSH: 1.652 u[IU]/mL (ref 0.350–4.500)

## 2021-05-23 SURGERY — LOOP RECORDER INSERTION

## 2021-05-23 SURGERY — ECHOCARDIOGRAM, TRANSESOPHAGEAL
Anesthesia: Monitor Anesthesia Care

## 2021-05-23 MED ORDER — LIDOCAINE 2% (20 MG/ML) 5 ML SYRINGE
INTRAMUSCULAR | Status: DC | PRN
  Administered 2021-05-23: 100 mg via INTRAVENOUS

## 2021-05-23 MED ORDER — ASPIRIN EC 81 MG PO TBEC: 81.0000 mg | DELAYED_RELEASE_TABLET | Freq: Every day | ORAL | 11 refills | Status: AC

## 2021-05-23 MED ORDER — LIDOCAINE-EPINEPHRINE 1 %-1:100000 IJ SOLN
INTRAMUSCULAR | Status: AC
  Filled 2021-05-23: qty 1

## 2021-05-23 MED ORDER — FLUTICASONE PROPIONATE 50 MCG/ACT NA SUSP: 1.0000 | Freq: Every day | NASAL | 0 refills | Status: DC

## 2021-05-23 MED ORDER — LORATADINE 10 MG PO TABS: 10.0000 mg | ORAL_TABLET | Freq: Every day | ORAL | 0 refills | Status: DC

## 2021-05-23 MED ORDER — LIDOCAINE HCL (PF) 1 % IJ SOLN
INTRAMUSCULAR | Status: DC | PRN
  Administered 2021-05-23: 5 mL

## 2021-05-23 MED ORDER — PROPOFOL 500 MG/50ML IV EMUL
INTRAVENOUS | Status: DC | PRN
  Administered 2021-05-23: 100 ug/kg/min via INTRAVENOUS

## 2021-05-23 MED ORDER — STROKE: EARLY STAGES OF RECOVERY BOOK
Freq: Once | Status: AC
  Filled 2021-05-23: qty 1

## 2021-05-23 MED ORDER — ATORVASTATIN CALCIUM 80 MG PO TABS: 80.0000 mg | ORAL_TABLET | Freq: Every day | ORAL | 2 refills | Status: DC

## 2021-05-23 MED ORDER — SODIUM CHLORIDE 0.9 % IV SOLN: INTRAVENOUS | Status: DC | PRN

## 2021-05-23 MED ORDER — PROPOFOL 10 MG/ML IV BOLUS
INTRAVENOUS | Status: DC | PRN
  Administered 2021-05-23: 20 mg via INTRAVENOUS
  Administered 2021-05-23: 50 mg via INTRAVENOUS
  Administered 2021-05-23: 10 mg via INTRAVENOUS
  Administered 2021-05-23: 20 mg via INTRAVENOUS
  Administered 2021-05-23: 10 mg via INTRAVENOUS

## 2021-05-23 MED ORDER — CLOPIDOGREL BISULFATE 75 MG PO TABS: 75.0000 mg | ORAL_TABLET | Freq: Every day | ORAL | 0 refills | Status: AC

## 2021-05-23 SURGICAL SUPPLY — 2 items
PACK LOOP INSERTION (CUSTOM PROCEDURE TRAY) ×2 IMPLANT
SYSTEM MONITOR REVEAL LINQ II (Prosthesis & Implant Heart) ×1 IMPLANT

## 2021-05-23 NOTE — CV Procedure (Signed)
TEE: ?Anesthesia: Propofol ? ?Normal EF 60% ?No LAA thrombus ?Mobile atrial septum but negative bubble study and no PFO by color  ?Mild MR ?Normal AV ?Normal TV ?Normal PV ?Chordal SAM no LVOT gradient  ?No effusion ?No significant atherosclerotic aortic debris ? ?Patient to proceed with Linq insertion with Dr Elberta Fortis ? ?Charlton Haws MD [facility] ?

## 2021-05-23 NOTE — Anesthesia Procedure Notes (Signed)
Procedure Name: [place] ?Date/Time: 05/23/2021 10:06 AM ?Performed by: Dorann Lodge, CRNA ?Pre-anesthesia Checklist: Patient identified, Emergency Drugs available, Suction available and Patient being monitored ?Patient Re-evaluated:Patient Re-evaluated prior to induction ?Oxygen Delivery Method: Nasal cannula ?Induction Type: IV induction ?Airway Equipment and Method: Bite block ? ? ? ? ?

## 2021-05-23 NOTE — Transfer of Care (Signed)
Immediate Anesthesia Transfer of Care Note ? ?Patient: Ian Golden ? ?Procedure(s) Performed: TRANSESOPHAGEAL ECHOCARDIOGRAM (TEE) ?BUBBLE STUDY ? ?Patient Location: PACU ? ?Anesthesia Type:MAC ? ?Level of Consciousness: awake and drowsy ? ?Airway & Oxygen Therapy: Patient Spontanous Breathing and Patient connected to nasal cannula oxygen ? ?Post-op Assessment: Report given to RN and Post -op Vital signs reviewed and stable ? ?Post vital signs: Reviewed and stable ? ?Last Vitals:  ?Vitals Value Taken Time  ?BP 107/61 05/23/21 1039  ?Temp 36.4 ?C 05/23/21 1038  ?Pulse 69 05/23/21 1040  ?Resp 21 05/23/21 1040  ?SpO2 93 % 05/23/21 1040  ?Vitals shown include unvalidated device data. ? ?Last Pain:  ?Vitals:  ? 05/23/21 0908  ?TempSrc: Oral  ?PainSc:   ?   ? ?Patients Stated Pain Goal: 0 (05/22/21 2132) ? ?Complications: No notable events documented. ?

## 2021-05-23 NOTE — Consult Note (Addendum)
? ? ?ELECTROPHYSIOLOGY CONSULT NOTE  ?Patient ID: Ian Golden ?MRN: [id number], DOB/AGE: June 02, 1965  ? ?Admit date: 05/21/2021 ?Date of Consult: 05/23/2021 ? ?Primary Physician: Ian Loffler, MD ?Primary Cardiologist: None  ?Primary Electrophysiologist: New to Dr. Curt Golden ?Reason for Consultation: Cryptogenic stroke; recommendations regarding Implantable Loop Recorder ?Insurance: [employer] ? ?History of Present Illness ?EP has been asked to evaluate Ian Golden for placement of an implantable loop recorder to monitor for atrial fibrillation by Dr Ian Golden.  The patient was admitted on 05/21/2021 with confusion.   ? ?Imaging demonstrated bilateral SCA infarcts and PCA infarcts likely embolic from cryptogenic source.   ? ?She has undergone workup for stroke including:  ?Code Stroke CT head No acute abnormality. ASPECTS 10.    ?CTA head & neck But positive for bilateral cervical ICA Fibromuscular Dysplasia  ?MRI  Scattered acute infarcts in the bilateral cerebellar hemispheres and occipital lobes. ?2D Echo LVEF greater than 75%.  Hyperdynamic LV function.  No shunt detected, bilateral atrial size is normal ?LDL 149 ?Homocysteine-pending ?Cardiolipin antibodies-pending ?HgbA1c 5.6 ?VTE prophylaxis - Lovenox ?Diet: Heart   ?No antithrombotic prior to admission, now on aspirin 81 mg daily and clopidogrel 75 mg daily.  Into 3 weeks and then aspirin alone ?Therapy recommendations: Outpatient PT (neuro) ?Disposition: Pending ? ? ?The patient has been monitored on telemetry which has demonstrated sinus rhythm with no arrhythmias.  Inpatient stroke work-up Ian Golden require a TEE per Neurology.  ? ?Echocardiogram as above. Lab work is reviewed. ? ?Prior to admission, the patient denies chest pain, shortness of breath, dizziness, palpitations, or syncope.  He is recovering from his stroke with plans to return home  at discharge. ? ?Past Medical History:  ?Diagnosis Date  ? Allergic rhinitis, cause unspecified   ? Hemorrhoids   ? Migraine   ?  Ulcer   ?  ? ?Surgical History:  ?Past Surgical History:  ?Procedure Laterality Date  ? WISDOM TOOTH EXTRACTION    ? with sedation  ?  ? ?Medications Prior to Admission  ?Medication Sig Dispense Refill Last Dose  ? Ascorbic Acid (VITAMIN C PO) Take 1 tablet by mouth daily.   UNK  ? fluocinonide ointment (LIDEX) 0.05 % Apply topically 2 (two) times daily. 60 g 0 UNK  ? SUMAtriptan Succinate Refill 6 MG/0.5ML SOCT Inject 1 Syringe into the skin as needed. Inject one syringe subcutaneously at onset of headache. May repeat in 2 hours prn x 1 2.5 mL 0 UNK  ? TURMERIC PO Take 1 tablet by mouth daily.   UNK  ? ? ?Inpatient Medications:  ? aspirin  81 mg Oral Daily  ? atorvastatin  80 mg Oral Daily  ? clopidogrel  75 mg Oral Daily  ? enoxaparin (LOVENOX) injection  40 mg Subcutaneous Q24H  ? fluticasone  1-2 spray Each Nare Daily  ? guaiFENesin  600 mg Oral BID  ? loratadine  10 mg Oral Daily  ? ? ?Allergies: No Known Allergies ? ?Social History  ? ?Socioeconomic History  ? Marital status: Married  ?  Spouse name: Ian Golden  ? Number of children: 3  ? Years of education: Not on file  ? Highest education level: Not on file  ?Occupational History  ?  Employer: Ian Golden  ?  Comment: Law Enforcement  ?Tobacco Use  ? Smoking status: Never  ? Smokeless tobacco: Never  ?Substance and Sexual Activity  ? Alcohol use: Never  ?  Alcohol/week: 0.0 standard drinks  ? Drug use: Never  ? Sexual  activity: Yes  ?  Partners: Female  ?Other Topics Concern  ? Not on file  ?Social History Narrative  ? 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 Regular exercise- no  ? Right handed  ? ?Social Determinants of Health  ? ?Financial Resource Strain: Not on file  ?Food Insecurity: Not on file  ?Transportation Needs: Not on file  ?Physical Activity: Not on file  ?Stress: Not on file  ?Social Connections: Not on file  ?Intimate Partner Violence: Not on file  ?  ? ?Family History  ?Problem Relation Age of Onset  ? Colon polyps Father    ? Heart attack Mother   ? Heart disease Mother   ? Other Mother   ?     coroner said there were signs of possible start of dementia   ? Colon cancer Neg Hx   ? Esophageal cancer Neg Hx   ? Rectal cancer Neg Hx   ? Stomach cancer Neg Hx   ?  ? ? ?Review of Systems: ?All other systems reviewed and are otherwise negative except as noted above. ? ?Physical Exam: ?Vitals:  ? 05/22/21 1908 05/23/21 0000 05/23/21 0400 05/23/21 0747  ?BP: (!) 159/99 135/75 139/77 (!) 144/82  ?Pulse: 65 (!) 57 (!) 55 62  ?Resp:  18 18 15   ?Temp: 98.3 ?F (36.8 ?C) 98.5 ?F (36.9 ?C) 98.1 ?F (36.7 ?C) 98 ?F (36.7 ?C)  ?TempSrc: Oral Oral Oral Oral  ?SpO2: 98% 98% 100% 99%  ?Weight:      ?Height:      ? ? ?GEN- The patient is well appearing, alert and oriented x 3 today.   ?Head- normocephalic, atraumatic ?Eyes-  Sclera clear, conjunctiva pink ?Ears- hearing intact ?Oropharynx- clear ?Neck- supple ?Lungs- Clear to ausculation bilaterally, normal work of breathing ?Heart- Regular rate and rhythm, no murmurs, rubs or gallops  ?GI- soft, NT, ND, + BS ?Extremities- no clubbing, cyanosis, or edema ?MS- no significant deformity or atrophy ?Skin- no rash or lesion ?Psych- euthymic mood, full affect ?Neuro- Moves all 4 ? ? ?Labs: ?  ?Lab Results  ?Component Value Date  ? WBC 8.7 05/22/2021  ? HGB 13.5 05/22/2021  ? HCT 39.5 05/22/2021  ? MCV 88.8 05/22/2021  ? PLT 182 05/22/2021  ?  ?Recent Labs  ?Lab 05/21/21 ?1000 05/21/21 ?1007 05/22/21 ?[id number]  ?NA 141   < > 138  ?K 4.1   < > 3.9  ?CL 111   < > 110  ?CO2 25  --  22  ?BUN 19   < > 14  ?CREATININE 0.99   < > 1.06  ?CALCIUM 9.1  --  8.6*  ?PROT 6.4*  --   --   ?BILITOT 0.3  --   --   ?ALKPHOS 75  --   --   ?ALT 22  --   --   ?AST 25  --   --   ?GLUCOSE 94   < > 97  ? < > = values in this interval not displayed.  ? ?  ?Radiology/Studies: CT ANGIO HEAD NECK W WO CM ? ?Result Date: 05/21/2021 ?CLINICAL DATA:  56 year old male code stroke presentation. EXAM: CT ANGIOGRAPHY HEAD AND NECK TECHNIQUE:  Multidetector CT imaging of the head and neck was performed using the standard protocol during bolus administration of intravenous contrast. Multiplanar CT image reconstructions and MIPs were obtained to evaluate the vascular anatomy. Carotid stenosis measurements (when applicable) are obtained utilizing NASCET criteria, using the distal internal  carotid diameter as the denominator. RADIATION DOSE REDUCTION: This exam was performed according to the departmental dose-optimization program which includes automated exposure control, adjustment of the mA and/or kV according to patient size and/or use of iterative reconstruction technique. CONTRAST:  36mL OMNIPAQUE IOHEXOL 350 MG/ML SOLN COMPARISON:  Head CT 1010 hours today. FINDINGS: CTA NECK Skeleton: Mild motion artifact, especially at the mandible. No acute osseous abnormality identified. Upper chest: Negative. Other neck: Mild motion artifact. No acute soft tissue findings in the neck. Aortic arch: 3 vessel arch configuration. No arch atherosclerosis. Right carotid system: Mild motion artifact. Mild tortuosity. No atherosclerosis or stenosis identified. Left carotid system: Mild motion artifact. Mild tortuosity. No atherosclerosis or stenosis identified. Evaluating thick MIP images of the bilateral cervical carotids there is a beaded appearance in addition to tortuosity more apparent on the right (series 12, image 16). But also visible on the left (series 11, image 23). Vertebral arteries: Normal proximal right subclavian artery and right vertebral artery origin. Highly tortuous right vertebral artery throughout the neck and to the skull base but no atherosclerosis or stenosis identified. Mild soft plaque in the proximal left subclavian artery with no significant stenosis. Normal left vertebral artery origin. Left vertebral artery is dominant and tortuous to the skull base with no atherosclerosis or stenosis. CTA HEAD Posterior circulation: Dominant left V4 segment.  No distal vertebral or vertebrobasilar junction plaque or stenosis. Patent PICA origins. Patent basilar artery without stenosis. Normal SCA and PCA origins. Small posterior communicating arteries. Bilateral PCA

## 2021-05-23 NOTE — Progress Notes (Signed)
?  Echocardiogram ?Echocardiogram Transesophageal has been performed. ? ?Delcie Roch ?05/23/2021, 10:40 AM ?

## 2021-05-23 NOTE — Progress Notes (Addendum)
STROKE TEAM PROGRESS NOTE  ? ?INTERVAL HISTORY ?His wife is at the bedside.  Homocystine, cardiolipin antibodies pending.   ?TEE- negative bubble study, mobile atrial septum, mild MR ?Plan for loop reorder placement today ? ?Vitals:  ? 05/23/21 0747 05/23/21 0908 05/23/21 1038 05/23/21 1053  ?BP: (!) 144/82 (!) 155/87 107/61 (!) 142/77  ?Pulse: 62 66 76 65  ?Resp: 15 (!) 9 17 20   ?Temp: 98 ?F (36.7 ?C) 98.1 ?F (36.7 ?C) (!) 97.5 ?F (36.4 ?C) (!) 97.5 ?F (36.4 ?C)  ?TempSrc: Oral Oral    ?SpO2: 99% 96% 93% 94%  ?Weight:  83.9 kg    ?Height:  6\' 2"  (1.88 m)    ? ?CBC:  ?Recent Labs  ?Lab 05/21/21 ?1000 05/21/21 ?1007 05/22/21 ?07/21/21  ?WBC 6.1  --  8.7  ?NEUTROABS 3.4  --   --   ?HGB 14.9 14.6 13.5  ?HCT 43.3 43.0 39.5  ?MCV 88.5  --  88.8  ?PLT 201  --  182  ? ? ?Basic Metabolic Panel:  ?Recent Labs  ?Lab 05/21/21 ?1000 05/21/21 ?1007 05/22/21 ?07/21/21  ?NA 141 142 138  ?K 4.1 4.0 3.9  ?CL 111 107 110  ?CO2 25  --  22  ?GLUCOSE 94 91 97  ?BUN 19 22* 14  ?CREATININE 0.99 0.90 1.06  ?CALCIUM 9.1  --  8.6*  ? ? ?Lipid Panel:  ?Recent Labs  ?Lab 05/21/21 ?1206  ?CHOL 200  ?TRIG 78  ?HDL 35*  ?CHOLHDL 5.7  ?VLDL 16  ?LDLCALC 149*  ? ? ?HgbA1c:  ?Recent Labs  ?Lab 05/21/21 ?1152  ?HGBA1C 5.6  ? ? ?Urine Drug Screen: No results for input(s): LABOPIA, COCAINSCRNUR, LABBENZ, AMPHETMU, THCU, LABBARB in the last 168 hours.  ?Alcohol Level No results for input(s): ETH in the last 168 hours. ? ?IMAGING past 24 hours ?No results found. ? ?PHYSICAL EXAM ? ?Physical Exam  ?Constitutional: Appears well-developed and well-nourished.  ?Cardiovascular: Normal rate and regular rhythm.  ?Respiratory: Effort normal, non-labored breathing ? ?Neuro: ?Mental Status: ?Patient is awake, alert, oriented to person, place, month, year, and situation. ?Patient is able to give a clear and coherent history. ?No signs of aphasia or neglect.  Diminished recall 2/3. ?Cranial Nerves: ?II: Visual Fields are full. Pupils are equal, round, and reactive to light.    ?III,IV, VI: EOMI without ptosis or diploplia.  ?V: Facial sensation is symmetric to temperature ?VII: Facial movement is symmetric resting and smiling ?VIII: Hearing is intact to voice ?X: Palate elevates symmetrically ?XI: Shoulder shrug is symmetric. ?XII: Tongue protrudes midline without atrophy or fasciculations.  ?Motor: ?Tone is normal. Bulk is normal. 5/5 strength was present in all four extremities.  ?Sensory: ?Sensation is symmetric to light touch and temperature in the arms and legs. No extinction to DSS present.  ?Cerebellar: ?FNF and HKS are intact bilaterally ?  ? ?ASSESSMENT/PLAN ?Ian Golden is a 56 y.o. male with history of seasonal allergies and migraines presenting with confusion.  Patient does still report not being able to remember anything that happened on Sunday.  He remembers the family trip to [place] on Saturday afternoon and then waking up in the hospital overnight. MRI shows scattered acute infarcts in the bilateral hemispheres and occipital lobe likely embolic in etiology. Reported a familial history of a prothrombin gene mutation.  Labs pending. Start DAPT therapy with aspirin and Plavix.  Atorvastatin 80 mg ordered.  TEE done, Loop recorder placement today ? ?Stroke:  Bilateral SCA infarcts and  PCA infarcts likely  embolic from cryptogenic source ?Code Stroke CT head No acute abnormality. ASPECTS 10.    ?CTA head & neck But positive for bilateral cervical ICA Fibromuscular Dysplasia  ?MRI  Scattered acute infarcts in the bilateral cerebellar hemispheres and occipital lobes. ?2D Echo LVEF greater than 75%.  Hyperdynamic LV function.  No shunt detected, bilateral atrial size is normal ?LDL 149 ?TEE- negative bubble study, mild MR ?Homocysteine-pending ?Cardiolipin antibodies-pending ?HgbA1c 5.6 ?VTE prophylaxis - Lovenox ?   ?Diet  ? Diet NPO time specified  ? ?No antithrombotic prior to admission, now on aspirin 81 mg daily and clopidogrel 75 mg daily.  Into 3 weeks and then  aspirin alone ?Therapy recommendations: Outpatient PT (neuro) ?Disposition: Pending ? ?Hypertension ?Stable ?Permissive hypertension (OK if < 220/120) but gradually normalize in 5-7 days ?Long-term BP goal normotensive ? ?Hyperlipidemia ?LDL 149, goal < 70 ?Add Atorvastatin 80mg   ?Continue statin at discharge ? ?Other Stroke Risk Factors ?Migraines ?Controlled with PRN sumitriptan, [facility] patient  ? ?Other Active Problems ?Sinus headache r/t seasonal allergies ?Patient uses Flonase and Claritin at home ? ?Hospital day # 2 ? ?Patient seen and examined by NP/APP with MD. MD to update note as needed.  ? ? , DNP, FNP-BC ?[facility] ?Pager: [phone] ?I have personally obtained history,examined this patient, reviewed notes, independently viewed imaging studies, participated in medical decision making and plan of care.ROS completed by me personally and pertinent positives fully documented  I have made any additions or clarifications directly to the above note. Agree with note above.  TEE is unremarkable.  Plan loop recorder today prior to discharge home.  Follow-up as an outpatient stroke clinic in 2 months.  Discussed with patient and wife and answered questions. ? ?[phone], MD ?Medical Director ?[facility] ?Pager: [phone] ?05/23/2021 2:36 PM ? ?To contact Stroke Continuity provider, please refer to 07/23/2021. ?After hours, contact General Neurology ? ?

## 2021-05-23 NOTE — Discharge Summary (Signed)
?[facility] ? ?Physician Discharge Summary  ? ?Patient ID: ?Ian GottronHenry W Golden ?MRN: [id number] ?DOB/AGE: Apr 07, 1965 56 y.o. ? ?Admit date: 05/21/2021 ?Discharge date:   05/23/2021 ? ? ?PCP: Hannah Beatopland, [place], MD ? ?DISCHARGE DIAGNOSES:  ?Active Problems: ?  Acute CVA (cerebrovascular accident) (HCC) ?  Hypertensive urgency ?  Hyperlipidemia ?  Sinus headache ? ? ?RECOMMENDATIONS FOR OUTPATIENT FOLLOW UP: ?Outpatient follow-up with PCP in 1 week. ?May need antihypertensives at follow-up. ?Ambulatory referral sent to neurology for follow-up in 8 weeks ?Ambulatory referral sent for outpatient PT ? ? ?Home Health: Outpatient PT ?Equipment/Devices: None ? ?CODE STATUS: Full code ? ?DISCHARGE CONDITION: fair ? ?Diet recommendation: Heart healthy ? ?INITIAL HISTORY: ?56 y.o. male with medical history significant of migraine headaches and seasonal allergies who presented after being noted to be confused on the morning of admission.  Apparently forgot a few things while he was in the kitchen.  Felt dizzy and lightheaded.  No syncopal episodes reported.  His wife took him to the fire department where he was noted to be hypertensive.  EMS was called.  Patient brought into the hospital.  Evaluation positive for stroke concerning for embolic in etiology.  Patient started on aspirin Plavix statin.  Hospitalized for further management. ? ?Consultants: Neurology.  Electrophysiology ?  ?Procedures: Transthoracic echocardiogram.  TEE.  Loop recorder placement. EEG ? ?HOSPITAL COURSE:  ? ?Acute CVA (cerebrovascular accident) (HCC) ?Patient presented with confusion and feeling lightheaded.   ?MRI revealed scattered acute infarct in bilateral cerebellar hemisphere.  Noted in occipital lobes as well ?Neurology consulted.   ?Patient started on aspirin and Plavix.   ?LDL 149.  HbA1c 5.6.  EKG showed sinus rhythm. ?EEG did not show any epileptiform activity. ?Echocardiogram showed normal systolic function.  No shunt was noted. ?Patient seen by  neurology.  TEE has been recommended.  It was completed this morning and showed EF to be 60%.  No thrombus.  No PFO.  Plan is to proceed with loop recorder placement. ?Seen by PT and outpatient PT is recommended.  Ambulatory referral was sent.  ?OT evaluation is pending. ?Patient reports history of blood clots in his mother who also has a history of prothrombin gene mutation.  Patient apparently was tested about 5 years ago and was told that he has a "slight risk." ?Neurology recommends aspirin and Plavix for 3 weeks followed by aspirin alone.  Patient also started on statin. ? ?Hypertensive urgency ?Blood pressures initially elevated up to 180/97.  He is not on any medications for blood pressure at baseline. ?Permissive hypertension was allowed. ?Neurology recommends gradual normalization in 5 to 7 days. ?Blood pressure noted to be 142/77.  Would recommend this be monitored closely in the outpatient setting.  May need antihypertensives.  Recommend follow-up with PCP next week. ? ?Hyperlipidemia ?LDL 149.  Started on statin. ? ?Sinus headache ?Patient complains of having sinus congestion and headache related to allergies.  Continue Claritin and Flonase. ? ? ?Okay for discharge once loop recorder has been placed and patient has been seen by OT. ? ? ?PERTINENT LABS: ? ?The results of significant diagnostics from this hospitalization (including imaging, microbiology, ancillary and laboratory) are listed below for reference.   ? ? ?Labs: ? ? ? ?Basic Metabolic Panel: ?Recent Labs  ?Lab 05/21/21 ?1000 05/21/21 ?1007 05/22/21 ?[id number]  ?NA 141 142 138  ?K 4.1 4.0 3.9  ?CL 111 107 110  ?CO2 25  --  22  ?GLUCOSE 94 91 97  ?BUN 19 22* 14  ?  CREATININE 0.99 0.90 1.06  ?CALCIUM 9.1  --  8.6*  ? ?Liver Function Tests: ?Recent Labs  ?Lab 05/21/21 ?1000  ?AST 25  ?ALT 22  ?ALKPHOS 75  ?BILITOT 0.3  ?PROT 6.4*  ?ALBUMIN 3.7  ? ?CBC: ?Recent Labs  ?Lab 05/21/21 ?1000 05/21/21 ?1007 05/22/21 ?9509  ?WBC 6.1  --  8.7  ?NEUTROABS 3.4  --    --   ?HGB 14.9 14.6 13.5  ?HCT 43.3 43.0 39.5  ?MCV 88.5  --  88.8  ?PLT 201  --  182  ? ? ? ?CBG: ?Recent Labs  ?Lab 05/21/21 ?1031  ?GLUCAP 103*  ? ? ? ?IMAGING STUDIES ?CT ANGIO HEAD NECK W WO CM ? ?Result Date: 05/21/2021 ?CLINICAL DATA:  56 year old male code stroke presentation. EXAM: CT ANGIOGRAPHY HEAD AND NECK TECHNIQUE: Multidetector CT imaging of the head and neck was performed using the standard protocol during bolus administration of intravenous contrast. Multiplanar CT image reconstructions and MIPs were obtained to evaluate the vascular anatomy. Carotid stenosis measurements (when applicable) are obtained utilizing NASCET criteria, using the distal internal carotid diameter as the denominator. RADIATION DOSE REDUCTION: This exam was performed according to the departmental dose-optimization program which includes automated exposure control, adjustment of the mA and/or kV according to patient size and/or use of iterative reconstruction technique. CONTRAST:  56mL OMNIPAQUE IOHEXOL 350 MG/ML SOLN COMPARISON:  Head CT 1010 hours today. FINDINGS: CTA NECK Skeleton: Mild motion artifact, especially at the mandible. No acute osseous abnormality identified. Upper chest: Negative. Other neck: Mild motion artifact. No acute soft tissue findings in the neck. Aortic arch: 3 vessel arch configuration. No arch atherosclerosis. Right carotid system: Mild motion artifact. Mild tortuosity. No atherosclerosis or stenosis identified. Left carotid system: Mild motion artifact. Mild tortuosity. No atherosclerosis or stenosis identified. Evaluating thick MIP images of the bilateral cervical carotids there is a beaded appearance in addition to tortuosity more apparent on the right (series 12, image 16). But also visible on the left (series 11, image 23). Vertebral arteries: Normal proximal right subclavian artery and right vertebral artery origin. Highly tortuous right vertebral artery throughout the neck and to the skull  base but no atherosclerosis or stenosis identified. Mild soft plaque in the proximal left subclavian artery with no significant stenosis. Normal left vertebral artery origin. Left vertebral artery is dominant and tortuous to the skull base with no atherosclerosis or stenosis. CTA HEAD Posterior circulation: Dominant left V4 segment. No distal vertebral or vertebrobasilar junction plaque or stenosis. Patent PICA origins. Patent basilar artery without stenosis. Normal SCA and PCA origins. Small posterior communicating arteries. Bilateral PCA branches are within normal limits. Anterior circulation: Both ICA siphons are patent with no atherosclerosis or stenosis. Normal posterior communicating artery origins. Patent carotid termini. Normal MCA and ACA origins. Dominant left A1, the right is diminutive. Anterior communicating artery and bilateral ACA branches are within normal limits. Left MCA M1 segment bifurcates early without stenosis. Right MCA M1 segment is tortuous and bifurcates without stenosis. Bilateral MCA branches are within normal limits. Venous sinuses: Early contrast timing but the superior sagittal sinus, torcula, transverse and sigmoid sinuses are enhancing and appear patent. Anatomic variants: Dominant left vertebral artery. Dominant left and diminutive right ACA A1 segment. Review of the MIP images confirms the above findings IMPRESSION: 1. Negative for large vessel occlusion.  Mild motion artifact. 2. Minimal atherosclerosis in the head or neck with no arterial stenosis identified. But positive for bilateral cervical ICA Fibromuscular Dysplasia (FMD). These results were communicated  to Dr. Selina Cooley at 12:12 pm on 05/21/2021 by text page via the [facility] messaging system. Electronically Signed   By: Odessa Fleming M.D.   On: 05/21/2021 12:18  ? ?MR BRAIN WO CONTRAST ? ?Result Date: 05/21/2021 ?CLINICAL DATA:  Transient global amnesia, stroke suspected EXAM: MRI HEAD WITHOUT CONTRAST TECHNIQUE: Multiplanar, multiecho  pulse sequences of the brain and surrounding structures were obtained without intravenous contrast. COMPARISON:  05/21/2021 CTA, 12/02/2019 MRI FINDINGS: Brain: Scattered, mostly punctate foci of restric

## 2021-05-23 NOTE — Evaluation (Signed)
Occupational Therapy Evaluation ?Patient Details ?Name: Ian Golden ?MRN: [id number] ?DOB: 10/14/1965 ?Today's Date: 05/23/2021 ? ? ?History of Present Illness Pt is a 56 y/o male admitted secondary to dizziness. Found to have hypertensive urgency. Imaging revealed bilateral cerebellar and occipital lobe infarcts.  ? ?Clinical Impression ?  ?Pt currently presents at independent level for simulated selfcare tasks and functional transfers into and out of the shower as well as to the toilet.  He reports slight headache on the left side of his head but not significant.  He does exhibit some slight increased dizziness when reaching down to the floor and standing up quickly as well as aslower gait pattern when walking and turning his head side to side. Recommend initial use of the shower seat for safety when washing his feet and head but no further OT treatment recommended.  Feel he will benefit from outpatient PT to address higher level vestibular deficits.   ?   ? ?Recommendations for follow up therapy are one component of a multi-disciplinary discharge planning process, led by the attending physician.  Recommendations may be updated based on patient status, additional functional criteria and insurance authorization.  ? ?Follow Up Recommendations ? No OT follow up  ?  ?Assistance Recommended at Discharge PRN  ?Patient can return home with the following Assist for transportation ? ?  ?   ?Equipment Recommendations ? None recommended by OT  ?  ?Recommendations for Other Services   ? ? ?  ?Precautions / Restrictions Precautions ?Precautions: None ?Restrictions ?Weight Bearing Restrictions: No  ? ?  ? ?Mobility Bed Mobility ?Overal bed mobility: Independent ?  ?  ?  ?Supine to sit: Independent ?Sit to supine: Independent ?  ?  ?  ? ?Transfers ?Overall transfer level: Modified independent (Moves slower than baseline with initial sit to stand and mobility.) ?Equipment used: None ?Transfers: Sit to/from Stand ?Sit to Stand:  Independent ?  ?  ?  ?  ?  ?  ?  ? ?  ?Balance Overall balance assessment: Mild deficits observed, not formally tested ?  ?Sitting balance-Leahy Scale: Normal ?  ?  ?Standing balance support: During functional activity ?Standing balance-Leahy Scale: Good (Pt with slight dizziness when standing up quickly from picking something off of the floor, but no LOB noted.  He walked slowly when asked to complete head turns during mobility, but then sped up when walking without head turns.) ?  ?  ?  ?  ?  ?  ?  ?  ?  ?  ?  ?  ?   ? ?ADL either performed or assessed with clinical judgement  ? ?ADL Overall ADL's : Modified independent ?  ?  ?  ?  ?  ?  ?  ?  ?  ?  ?  ?  ?  ?  ?  ?  ?  ?  ?  ?General ADL Comments: Pt modified independent for completion of simulated selfcare tasks and toilet transfers without an assitive device.  ? ? ? ?Vision Baseline Vision/History: 1 Wears glasses (reading only) ?Ability to See in Adequate Light: 0 Adequate ?Patient Visual Report: Other (comment) (Slight haziness but clearer than yesterday.) ?Vision Assessment?: Yes ?Eye Alignment: Within Functional Limits ?Ocular Range of Motion: Within Functional Limits ?Alignment/Gaze Preference: Within Defined Limits ?Tracking/Visual Pursuits: Able to track stimulus in all quads without difficulty ?Saccades: Within functional limits ?Convergence: Within functional limits ?Visual Fields: No apparent deficits ?Additional Comments: Pt able to read larger print without readers  and withou difficulty.  VOR intact with slight dizziness reported with VOR cancellation but able to complete without any nystagmus or catch up saccades.  ?   ?Perception Perception ?Perception: Within Functional Limits ?  ?Praxis Praxis ?Praxis: Intact ?  ? ?Pertinent Vitals/Pain Pain Assessment ?Pain Assessment: Faces ?Faces Pain Scale: Hurts a little bit ?Pain Location: left side of head ?Pain Descriptors / Indicators: Headache ?Pain Intervention(s): Limited activity within patient's  tolerance, Monitored during session  ? ? ? ?Hand Dominance Right ?  ?Extremity/Trunk Assessment Upper Extremity Assessment ?Upper Extremity Assessment: Overall WFL for tasks assessed ?  ?Lower Extremity Assessment ?Lower Extremity Assessment: Defer to PT evaluation ?  ?Cervical / Trunk Assessment ?Cervical / Trunk Assessment: Normal ?  ?Communication Communication ?Communication: No difficulties ?  ?Cognition Arousal/Alertness: Awake/alert ?Behavior During Therapy: [facility] for tasks assessed/performed ?Overall Cognitive Status: Within Functional Limits for tasks assessed ?Area of Impairment: Following commands, Safety/judgement, Awareness ?  ?  ?  ?  ?  ?  ?  ?  ?  ?  ?  ?Following Commands: Follows multi-step commands consistently ?  ?Awareness: Anticipatory ?  ?General Comments: Pt able to recall 3/3 words after 10 minute delay. ?  ?  ?   ?   ?Shoulder Instructions   ? ? ?Home Living Family/patient expects to be discharged to:: Private residence ?Living Arrangements: Spouse/significant other;Children ?Available Help at Discharge: Family ?Type of Home: House ?Home Access: Stairs to enter ?Entrance Stairs-Number of Steps: 3 ?Entrance Stairs-Rails: Right ?Home Layout: Two level ?Alternate Level Stairs-Number of Steps: flight ?Alternate Level Stairs-Rails: Left ?Bathroom Shower/Tub: Walk-in shower ?  ?Bathroom Toilet: Standard ?  ?  ?Home Equipment: Shower seat (built in) ?  ?  ?  ? ?  ?Prior Functioning/Environment Prior Level of Function : Independent/Modified Independent;Driving ?  ?  ?  ?  ?  ?  ?  ?  ?  ? ?  ?  ?   ?   ?   ? ?AM-PAC OT "6 Clicks" Daily Activity     ?Outcome Measure Help from another person eating meals?: None ?Help from another person taking care of personal grooming?: None ?Help from another person toileting, which includes using toliet, bedpan, or urinal?: None ?Help from another person bathing (including washing, rinsing, drying)?: None ?Help from another person to put on and taking off regular  upper body clothing?: None ?Help from another person to put on and taking off regular lower body clothing?: None ?6 Click Score: 24 ?  ?End of Session Equipment Utilized During Treatment: Gait belt ?Nurse Communication: Mobility status ? ?Activity Tolerance: Patient tolerated treatment well ?Patient left: in bed;with call bell/phone within reach ? ?   ?              ?Time: 9476-5465 ?OT Time Calculation (min): 23 min ?Charges:  OT Evaluation ?$OT Eval Low Complexity: 1 Low ?OT Treatments ?$Self Care/Home Management : 8-22 mins ? ?Christin Mccreedy OTR/L ?05/23/2021, 2:15 PM ?

## 2021-05-23 NOTE — Anesthesia Postprocedure Evaluation (Signed)
Anesthesia Post Note ? ?Patient: Ian Golden ? ?Procedure(s) Performed: TRANSESOPHAGEAL ECHOCARDIOGRAM (TEE) ?BUBBLE STUDY ? ?  ? ?Patient location during evaluation: Endoscopy ?Anesthesia Type: MAC ?Level of consciousness: awake ?Pain management: pain level controlled ?Vital Signs Assessment: post-procedure vital signs reviewed and stable ?Respiratory status: spontaneous breathing ?Cardiovascular status: stable ?Postop Assessment: no apparent nausea or vomiting ?Anesthetic complications: no ? ? ?No notable events documented. ? ?Last Vitals:  ?Vitals:  ? 05/23/21 1053 05/23/21 1154  ?BP: (!) 142/77 (!) 148/90  ?Pulse: 65 64  ?Resp: 20 16  ?Temp: (!) 36.4 ?C 36.6 ?C  ?SpO2: 94% 94%  ?  ?Last Pain:  ?Vitals:  ? 05/23/21 1255  ?TempSrc:   ?PainSc: 0-No pain  ? ? ?  ?  ?  ?  ?  ?  ? ?Aanika Defoor ? ? ? ? ?

## 2021-05-23 NOTE — Anesthesia Preprocedure Evaluation (Signed)
Anesthesia Evaluation  ?Patient identified by MRN, date of birth, ID band ?Patient awake ? ? ? ?Reviewed: ?Allergy & Precautions, NPO status , Patient's Chart, lab work & pertinent test results ? ?Airway ?Mallampati: II ? ?TM Distance: >3 FB ? ? ? ? Dental ?  ?Pulmonary ? ?  ?breath sounds clear to auscultation ? ? ? ? ? ? Cardiovascular ?hypertension,  ?Rhythm:Regular Rate:Normal ? ? ?  ?Neuro/Psych ? Headaches, CVA   ? GI/Hepatic ?negative GI ROS, Neg liver ROS,   ?Endo/Other  ?negative endocrine ROS ? Renal/GU ?negative Renal ROS  ? ?  ?Musculoskeletal ? ? Abdominal ?  ?Peds ? Hematology ?  ?Anesthesia Other Findings ? ? Reproductive/Obstetrics ? ?  ? ? ? ? ? ? ? ? ? ? ? ? ? ?  ?  ? ? ? ? ? ? ? ? ?Anesthesia Physical ?Anesthesia Plan ? ?ASA: 3 ? ?Anesthesia Plan: General and MAC  ? ?Post-op Pain Management:   ? ?Induction:  ? ?PONV Risk Score and Plan: 2 and Propofol infusion and Treatment may vary due to age or medical condition ? ?Airway Management Planned: Nasal Cannula and Simple Face Mask ? ?Additional Equipment:  ? ?Intra-op Plan:  ? ?Post-operative Plan:  ? ?Informed Consent: I have reviewed the patients History and Physical, chart, labs and discussed the procedure including the risks, benefits and alternatives for the proposed anesthesia with the patient or authorized representative who has indicated his/her understanding and acceptance.  ? ? ? ?Dental advisory given ? ?Plan Discussed with: CRNA and Anesthesiologist ? ?Anesthesia Plan Comments:   ? ? ? ? ? ? ?Anesthesia Quick Evaluation ? ?

## 2021-05-23 NOTE — Progress Notes (Signed)
Discharge instructions given to pt, verbalized understanding of care at home for loop recorder device, medications and follow-up appointments. ?

## 2021-05-23 NOTE — Plan of Care (Signed)
?  Problem: Self-Care: ?Goal: Ability to participate in self-care as condition permits will improve ?05/23/2021 0457 by Rosina Lowenstein, RN ?Outcome: Progressing ?05/23/2021 0456 by Rosina Lowenstein, RN ?Outcome: Progressing ?05/22/2021 1920 by Rosina Lowenstein, RN ?Outcome: Progressing ?  ?Problem: Self-Care: ?Goal: Verbalization of feelings and concerns over difficulty with self-care will improve ?05/23/2021 0457 by Rosina Lowenstein, RN ?Outcome: Progressing ?05/23/2021 0456 by Rosina Lowenstein, RN ?Outcome: Progressing ?05/22/2021 1920 by Rosina Lowenstein, RN ?Outcome: Progressing ?  ?Problem: Self-Care: ?Goal: Ability to communicate needs accurately will improve ?05/23/2021 0457 by Rosina Lowenstein, RN ?Outcome: Progressing ?05/23/2021 0456 by Rosina Lowenstein, RN ?Outcome: Progressing ?05/22/2021 1920 by Rosina Lowenstein, RN ?Outcome: Progressing ?  ?Problem: Nutrition: ?Goal: Risk of aspiration will decrease ?05/23/2021 0457 by Rosina Lowenstein, RN ?Outcome: Progressing ?05/23/2021 0456 by Rosina Lowenstein, RN ?Outcome: Progressing ?05/22/2021 1920 by Rosina Lowenstein, RN ?Outcome: Progressing ?  ?Problem: Nutrition: ?Goal: Dietary intake will improve ?05/23/2021 0457 by Rosina Lowenstein, RN ?Outcome: Progressing ?05/23/2021 0456 by Rosina Lowenstein, RN ?Outcome: Progressing ?05/22/2021 1920 by Rosina Lowenstein, RN ?Outcome: Progressing ?  ?Problem: Education: ?Goal: Knowledge of disease or condition will improve ?Outcome: Progressing ?  ?Problem: Education: ?Goal: Knowledge of secondary prevention will improve (SELECT ALL) ?Outcome: Progressing ?  ?

## 2021-05-23 NOTE — Interval H&P Note (Signed)
History and Physical Interval Note: ? ?05/23/2021 ?10:14 AM ? ?Ian Golden  has presented today for surgery, with the diagnosis of STROKE.  The various methods of treatment have been discussed with the patient and family. After consideration of risks, benefits and other options for treatment, the patient has consented to  Procedure(s): ?TRANSESOPHAGEAL ECHOCARDIOGRAM (TEE) (N/A) as a surgical intervention.  The patient's history has been reviewed, patient examined, no change in status, stable for surgery.  I have reviewed the patient's chart and labs.  Questions were answered to the patient's satisfaction.   ? ? ?[name] ? ? ?

## 2021-05-23 NOTE — TOC Transition Note (Signed)
Transition of Care (TOC) - CM/SW Discharge Note ? ? ?Patient Details  ?Name: Ian Golden ?MRN: [id number] ?Date of Birth: Jun 24, 1965 ? ?Transition of Care (TOC) CM/SW Contact:  ?Lawerance Sabal, RN ?Phone Number: ?05/23/2021, 11:37 AM ? ? ?Clinical Narrative:    ?Patient to DC to home in care of wife. OP PT OT referrals already made. ?No DME or medication needs. ? ?No TOC needs for DC ? ? ? ? ?  ?  ? ? ?Patient Goals and CMS Choice ?  ?  ?  ? ?Discharge Placement ?  ?           ?  ?  ?  ?  ? ?Discharge Plan and Services ?  ?  ?           ?  ?  ?  ?  ?  ?  ?  ?  ?  ?  ? ?Social Determinants of Health (SDOH) Interventions ?  ? ? ?Readmission Risk Interventions ?   ? View : No data to display.  ?  ?  ?  ? ? ? ? ? ?

## 2021-05-23 NOTE — Progress Notes (Signed)
Patient off floor to Endo.   

## 2021-05-23 NOTE — Progress Notes (Signed)
PT Cancellation Note ? ?Patient Details ?Name: Ian Golden ?MRN: [id number] ?DOB: 09-Sep-1965 ? ? ?Cancelled Treatment:    Reason Eval/Treat Not Completed: Patient at procedure or test/unavailable - will check back. ? ?Marye Round, PT DPT ?Acute Rehabilitation Services ?Pager [phone]  ?Office [phone] ? ? ? ?[name] ?05/23/2021, 10:21 AM ?

## 2021-05-23 NOTE — Discharge Instructions (Signed)

## 2021-05-24 ENCOUNTER — Telehealth: Payer: Self-pay

## 2021-05-24 ENCOUNTER — Encounter (HOSPITAL_COMMUNITY): Payer: Self-pay | Admitting: Cardiology

## 2021-05-24 LAB — CARDIOLIPIN ANTIBODIES, IGG, IGM, IGA
Anticardiolipin IgA: <9 APL U/mL (ref 0–11)
Anticardiolipin IgG: <9 GPL U/mL (ref 0–14)
Anticardiolipin IgM: <9 MPL U/mL (ref 0–12)

## 2021-05-24 NOTE — Telephone Encounter (Signed)
Transition Care Management Follow-up Telephone Call ?Date of discharge and from where: [facility] 05-23-21 Dx: Acute CVA ?How have you been since you were released from the hospital? Doing ok just tired  ?Any questions or concerns? No ? ?Items Reviewed: ?Did the pt receive and understand the discharge instructions provided? Yes  ?Medications obtained and verified? Yes  ?Other? No  ?Any new allergies since your discharge? No  ?Dietary orders reviewed? Yes ?Do you have support at home? Yes  ? ?Home Care and Equipment/Supplies: ?Were home health services ordered? Yes PT  ?If so, what is the name of the agency? Unsure of name   ?Has the agency set up a time to come to the patient's home? no ?Were any new equipment or medical supplies ordered?  No ?What is the name of the medical supply agency? na ?Were you able to get the supplies/equipment? not applicable ?Do you have any questions related to the use of the equipment or supplies? No ? ?Functional Questionnaire: (I = Independent and D = Dependent) ?ADLs: I ? ?Bathing/Dressing- I ? ?Meal Prep- I ? ?Eating- I ? ?Maintaining continence- I ? ?Transferring/Ambulation- I ? ?Managing Meds- I ? ?Follow up appointments reviewed: ? ?PCP Hospital f/u appt confirmed? Yes  -Scheduled to see Dr Lorelei Pont on -06-05-21 @ 1140am. ?[facility] f/u appt confirmed? No - pt will make 8 wk fu appt with Neurologist . ?Are transportation arrangements needed? No  ?If their condition worsens, is the pt aware to call PCP or go to the Emergency Dept.? Yes ?Was the patient provided with contact information for the PCP's office or ED? Yes ?Was to pt encouraged to call back with questions or concerns? Yes  ?

## 2021-06-05 ENCOUNTER — Encounter: Payer: Self-pay | Admitting: Family Medicine

## 2021-06-05 ENCOUNTER — Ambulatory Visit: Payer: 59 | Admitting: Family Medicine

## 2021-06-05 VITALS — BP 122/82 | HR 84 | Temp 98.3°F | Ht 73.0 in | Wt 196.2 lb

## 2021-06-05 DIAGNOSIS — Z8673 Personal history of transient ischemic attack (TIA), and cerebral infarction without residual deficits: Secondary | ICD-10-CM

## 2021-06-05 DIAGNOSIS — I639 Cerebral infarction, unspecified: Secondary | ICD-10-CM

## 2021-06-05 DIAGNOSIS — E782 Mixed hyperlipidemia: Secondary | ICD-10-CM

## 2021-06-05 NOTE — Progress Notes (Signed)
? ? ?Alfonso Carden T. Tashina Credit, MD, CAQ Sports Medicine ?Nature conservation officer at [facility] ?[address] [place] ?[place], [address] ? ?Phone: [phone]  FAX: [phone] ? ?ESTANISLADO SURGEON - 56 y.o. male  MRN [id number]  Date of Birth: September 30, 1965 ? ?Date: 06/05/2021  PCP: Hannah Beat, MD  Referral: Hannah Beat, MD ? ?Chief Complaint  ?Patient presents with  ? Hospitalization Follow-up  ? ? ?This visit occurred during the SARS-CoV-2 public health emergency.  Safety protocols were in place, including screening questions prior to the visit, additional usage of staff PPE, and extensive cleaning of exam room while observing appropriate contact time as indicated for disinfecting solutions.  ? ?Subjective:  ? ?Ian Golden is a 56 y.o. very pleasant male patient with Body mass index is 25.88 kg/m?. who presents with the following: ? ?TCM 14 ? ?Admit date: 05/21/2021 ?Discharge date:   05/23/2021 ? ?He does have Neurology follow-up. ? ?PT - new patient app scheduled. ? ?Loop recorder for AF assessment - remains in place.  Cardiology is trying to assess whether or not the patient did have A-fib as a cause of his stroke, however all testing thus far telemetry as well as echo is grossly been normal. ? ?He does have amnesia of the day when he had his events, and his wife did note that he had been confused and quite dizzy on the day of admission.  He had forgotten a few things that would be not in his norm.  Fire department was called, and they had concern for possible stroke as well as high blood pressure. ? ?He was found to have a stroke, embolic in nature, and ultimately discharged in good condition.  This point there has not been a clear source of stroke is been identified. ? ?He continues to be fatigued. ?He has been upset that he has not been able to be quite as active in and about the home, and he likes to use his lawnmower and be outside more normally. ? ?He denies any weakness, numbness, imbalance, vertigo,  double vision, memory loss confusion, at this point he is doing quite a bit better. ? ?He also notes that he is heterozygous for prothrombin, and he had some questions about this. ? ?He has currently on Plavix and aspirin, with a plan to stop and go with 1 agent after 3 weeks. ? ?Review of Systems is noted in the HPI, as appropriate ? ?Patient Active Problem List  ? Diagnosis Date Noted  ? Acute CVA (cerebrovascular accident) (HCC) 05/21/2021  ?  Priority: High  ? MIGRAINE WITHOUT AURA 05/03/2010  ?  Priority: Medium   ? Hyperlipidemia 05/21/2021  ? Hemorrhoids 05/19/2013  ? ALLERGIC RHINITIS 05/26/2009  ? GASTRIC ULCER, HX OF 05/26/2009  ? ? ?Past Medical History:  ?Diagnosis Date  ? Allergic rhinitis, cause unspecified   ? Hemorrhoids   ? Migraine   ? Ulcer   ? ? ?Past Surgical History:  ?Procedure Laterality Date  ? BUBBLE STUDY  05/23/2021  ? Procedure: BUBBLE STUDY;  Surgeon: Wendall Stade, MD;  Location: [facility] ENDOSCOPY;  Service: Cardiovascular;;  ? LOOP RECORDER INSERTION N/A 05/23/2021  ? Procedure: LOOP RECORDER INSERTION;  Surgeon: Regan Lemming, MD;  Location: [facility] INVASIVE CV LAB;  Service: Cardiovascular;  Laterality: N/A;  ? TEE WITHOUT CARDIOVERSION N/A 05/23/2021  ? Procedure: TRANSESOPHAGEAL ECHOCARDIOGRAM (TEE);  Surgeon: Wendall Stade, MD;  Location: [facility] ENDOSCOPY;  Service: Cardiovascular;  Laterality: N/A;  ? WISDOM TOOTH EXTRACTION    ?  with sedation  ? ? ?Family History  ?Problem Relation Age of Onset  ? Colon polyps Father   ? Heart attack Mother   ? Heart disease Mother   ? Other Mother   ?     coroner said there were signs of possible start of dementia   ? Colon cancer Neg Hx   ? Esophageal cancer Neg Hx   ? Rectal cancer Neg Hx   ? Stomach cancer Neg Hx   ?  ? ?Objective:  ? ?BP 122/82   Pulse 84   Temp 98.3 ?F (36.8 ?C) (Oral)   Ht 6\' 1"  (1.854 m)   Wt 196 lb 3 oz (89 kg)   SpO2 94%   BMI 25.88 kg/m?  ? ?GEN: No acute distress; alert,appropriate. ?PULM: Breathing comfortably in no  respiratory distress ?PSYCH: Normally interactive.  ?CV: RRR, no m/g/r  ?PULM: Normal respiratory rate, no accessory muscle use. No wheezes, crackles or rhonchi  ? ?Laboratory and Imaging Data: ?Extensive record review from the hospital of imaging studies, labs, consultation notes. ? ?Assessment and Plan:  ? ?  ICD-10-CM   ?1. Acute CVA (cerebrovascular accident) (HCC)  I63.9   ?  ?2. Mixed hyperlipidemia  E78.2   ?  ? ?No clear CVA cause.  He does have an implantable loop recorder.  No significant findings on TTE or a TEE. ? ?Thankfully he is doing well now.  He does have a very supportive family, his wife is here with him today. ? ?I do not think that anything else needs to be done.  What is already been done in the hospital.  He encourage him to follow-up and keep his neuro rehab appointment, and he already has a follow-up appointment scheduled with neurology. ? ?Follow-up: Return in about 3 months (around 09/04/2021) for cholesterol, stroke follow-up.  Labs 1 week before.. ? ?Dragon [employer] One speech-to-text software was used for transcription in this dictation.  Possible transcriptional errors can occur using 09/06/2021.  ? ?Signed, ? ?Ivery Nanney T. Zerenity Bowron, MD ? ? ?Outpatient Encounter Medications as of 06/05/2021  ?Medication Sig  ? Ascorbic Acid (VITAMIN C PO) Take 1 tablet by mouth daily.  ? aspirin EC 81 MG tablet Take 1 tablet (81 mg total) by mouth daily. Swallow whole.  ? atorvastatin (LIPITOR) 80 MG tablet Take 1 tablet (80 mg total) by mouth daily.  ? clopidogrel (PLAVIX) 75 MG tablet Take 1 tablet (75 mg total) by mouth daily for 21 days. For 3 weeks only  ? fluocinonide ointment (LIDEX) 0.05 % Apply topically 2 (two) times daily.  ? fluticasone (FLONASE) 50 MCG/ACT nasal spray Place 1-2 sprays into both nostrils daily.  ? loratadine (CLARITIN) 10 MG tablet Take 1 tablet (10 mg total) by mouth daily.  ? SUMAtriptan Succinate Refill 6 MG/0.5ML SOCT Inject 1 Syringe into the skin as needed. Inject one  syringe subcutaneously at onset of headache. May repeat in 2 hours prn x 1  ? ?No facility-administered encounter medications on file as of 06/05/2021.  ?  ?

## 2021-06-06 ENCOUNTER — Ambulatory Visit: Payer: 59

## 2021-06-07 ENCOUNTER — Ambulatory Visit: Payer: 59

## 2021-06-08 ENCOUNTER — Ambulatory Visit: Payer: 59 | Attending: Internal Medicine

## 2021-06-08 ENCOUNTER — Ambulatory Visit (INDEPENDENT_AMBULATORY_CARE_PROVIDER_SITE_OTHER): Payer: 59

## 2021-06-08 VITALS — BP 110/76 | HR 66

## 2021-06-08 DIAGNOSIS — R2689 Other abnormalities of gait and mobility: Secondary | ICD-10-CM | POA: Insufficient documentation

## 2021-06-08 DIAGNOSIS — I639 Cerebral infarction, unspecified: Secondary | ICD-10-CM | POA: Diagnosis not present

## 2021-06-08 NOTE — Patient Instructions (Addendum)
No restrictions. You may return to regular activity. Please keep your blue tooth on and your app open and plugged in at night. If you have any additional questions please call the device clinic at [phone].  ?

## 2021-06-08 NOTE — Therapy (Signed)
?OUTPATIENT PHYSICAL THERAPY NEURO EVALUATION ? ? ?Patient Name: Ian Golden ?MRN: [id number] ?DOB:01/11/1966, 56 y.o., male ?Today's Date: 06/08/2021 ? ?PCP: Owens Loffler, MD ?REFERRING PROVIDER: Bonnielee Haff, MD but will be seeing Antony Contras ? ? PT End of Session - 06/08/21 1015   ? ? Visit Number 1   ? Number of Visits 1   ? Date for PT Re-Evaluation 06/08/21   ? Authorization Type UHC   ? PT Start Time 1014   ? PT Stop Time 1046   ? PT Time Calculation (min) 32 min   ? Activity Tolerance Patient tolerated treatment well   ? Behavior During Therapy [facility] for tasks assessed/performed   ? ?  ?  ? ?  ? ? ?Past Medical History:  ?Diagnosis Date  ? Allergic rhinitis, cause unspecified   ? Hemorrhoids   ? Migraine   ? Ulcer   ? ?Past Surgical History:  ?Procedure Laterality Date  ? BUBBLE STUDY  05/23/2021  ? Procedure: BUBBLE STUDY;  Surgeon: Josue Hector, MD;  Location: [place];  Service: Cardiovascular;;  ? LOOP RECORDER INSERTION N/A 05/23/2021  ? Procedure: LOOP RECORDER INSERTION;  Surgeon: Constance Haw, MD;  Location: [place] [facility];  Service: Cardiovascular;  Laterality: N/A;  ? TEE WITHOUT CARDIOVERSION N/A 05/23/2021  ? Procedure: TRANSESOPHAGEAL ECHOCARDIOGRAM (TEE);  Surgeon: Josue Hector, MD;  Location: [facility] ENDOSCOPY;  Service: Cardiovascular;  Laterality: N/A;  ? WISDOM TOOTH EXTRACTION    ? with sedation  ? ?Patient Active Problem List  ? Diagnosis Date Noted  ? Hypertensive urgency 05/21/2021  ? Acute CVA (cerebrovascular accident) ([place]) 05/21/2021  ? Hyperlipidemia 05/21/2021  ? Sinus headache 05/21/2021  ? Hemorrhoids 05/19/2013  ? MIGRAINE WITHOUT AURA 05/03/2010  ? ALLERGIC RHINITIS 05/26/2009  ? GASTRIC ULCER, HX OF 05/26/2009  ? ? ?ONSET DATE: 05/21/21 ? ?REFERRING DIAG: I63.9 (ICD-10-CM) - Acute CVA (cerebrovascular accident)  ? ?THERAPY DIAG:  ?Other abnormalities of gait and mobility ? ?SUBJECTIVE:  ?                                                                                                                                                                                            ? ?SUBJECTIVE STATEMENT: ?Pt was recently hospitalized 4/2-05/23/21 for acute CVA. Pt reports he doesn't really remember what happened but what he was told that he was feeling light headed and confused. Wife took him to the fire department which was right next to them and found his BP was extremely high so was transported to the hospital. Had loop recorder placed. Pt reports that since coming home pt reports  that he is doing well. Feels like he is about back at baseline. No longer having any confusion.  ?Pt accompanied by: self ? ?PERTINENT HISTORY: 56 y.o. male with medical history significant of migraine headaches and seasonal allergies   ? ?PAIN:  ?Are you having pain? No ? ?PRECAUTIONS: None Has loop recorder that was put in at hospital. ? ?WEIGHT BEARING RESTRICTIONS No ? ?FALLS: Has patient fallen in last 6 months? No ? ?LIVING ENVIRONMENT: ?Lives with: lives with their family, lives with their spouse, and 3 children(14, 17 and 8), helps in-laws and aunt that lives nearby ?Lives in: House/apartment ?Stairs: Yes: Internal: flight steps; on right going up and External: 5 steps; on right going up ?Has following equipment at home: Grab bars ? ?PLOF: Independent, Independent with community mobility without device, and retired ? ?PATIENT GOALS Pt feels he is doing well with no real goals. ? ?OBJECTIVE:  ? ?DIAGNOSTIC FINDINGS: Acute CVA (cerebrovascular accident) ([place]) ?Patient presented with confusion and feeling lightheaded.   ?MRI revealed scattered acute infarct in bilateral cerebellar hemisphere.  Noted in occipital lobes as well ? ?COGNITION: ?Overall cognitive status: Within functional limits for tasks assessed. A and O x 4 ?  ?SENSATION: ?WFL ? ?COORDINATION: ?Intact with finger opposition and RAMs ? ?Vision: ?Peripheral vision intact. Denies any changes ? ? ? ?POSTURE: No Significant postural  limitations ? ?BUE strength WNL ? ?MMT:   ? ?MMT Right ?06/08/2021 Left ?06/08/2021  ?Hip flexion 5/5 5/5  ?Hip extension    ?Hip abduction    ?Hip adduction    ?Hip internal rotation    ?Hip external rotation    ?Knee flexion 5/5 5/5  ?Knee extension 5/5 5/5  ?Ankle dorsiflexion 5/5 4+/5  ?Ankle plantarflexion    ?Ankle inversion    ?Ankle eversion    ?(Blank rows = not tested) ? ? ?TRANSFERS: ? ?Sit to stand: Complete Independence ?Stand to sit: Complete Independence ? ? ? ? ?STAIRS: ? Level of Assistance: Complete Independence ? Stair Negotiation Technique: Alternating Pattern  with No Rails ? Number of Stairs: 4  ? Height of Stairs: 6  ?Comments:  ? ?GAIT: ?Gait pattern: step through pattern ?Distance walked: 300' ?Assistive device utilized: None ?Level of assistance: Complete Independence ?Comments:  ? ?FUNCTIONAL TESTs:  ?30 sec sit to stand without hands=12 reps ?10 M= 9.09 sec comfortable=1.65m/s  and 6.34 sec fast= 1.58 m/s ?SLS >20 sec bilateral ? ? [facility] PT Assessment - 06/08/21 1039   ? ?  ? Functional Gait  Assessment  ? Gait assessed  Yes   ? Gait Level Surface Walks 20 ft in less than 5.5 sec, no assistive devices, good speed, no evidence for imbalance, normal gait pattern, deviates no more than 6 in outside of the 12 in walkway width.   ? Change in Gait Speed Able to smoothly change walking speed without loss of balance or gait deviation. Deviate no more than 6 in outside of the 12 in walkway width.   ? Gait with Horizontal Head Turns Performs head turns smoothly with no change in gait. Deviates no more than 6 in outside 12 in walkway width   ? Gait with Vertical Head Turns Performs head turns with no change in gait. Deviates no more than 6 in outside 12 in walkway width.   ? Gait and Pivot Turn Pivot turns safely within 3 sec and stops quickly with no loss of balance.   ? Step Over Obstacle Is able to step over 2 stacked shoe  boxes taped together (9 in total height) without changing gait speed. No  evidence of imbalance.   ? Gait with Narrow Base of Support Is able to ambulate for 10 steps heel to toe with no staggering.   ? Gait with Eyes Closed Walks 20 ft, no assistive devices, good speed, no evidence of imbalance, normal gait pattern, deviates no more than 6 in outside 12 in walkway width. Ambulates 20 ft in less than 7 sec.   ? Ambulating Backwards Walks 20 ft, no assistive devices, good speed, no evidence for imbalance, normal gait   ? Steps Alternating feet, no rail.   ? Total Score 30   ? ?  ?  ? ?  ? ? ? ? ? ?PATIENT EDUCATION: ?Education details: results of testing and plan for eval only. Advised pt to slowly increase activity level and monitor BP for a bit with more activity. ?Person educated: Patient ?Education method: Explanation ?Education comprehension: verbalized understanding ? ? ? ? ? ? ? ?ASSESSMENT: ? ?CLINICAL IMPRESSION: ?Patient is a 56 y.o. male who was seen today for physical therapy evaluation and treatment for CVA. Pt is doing well since returning home from hospital and feels he is back at baseline. Denies any confusion at this point. Was able to explain to PT what occurred well. No strength deficits noted with 5/5 strength grossly in BLE and BUE. Pt showed good functional strength with 30 sec sit to stand of 12 reps without hands. Coordination and sensation intact. No visual deficits. Pt did not present with any balance deficits. Was able to maintain SLS >20 sec bilateral, FGA score of 30/30. Gait speed without AD 1.51m/s indicating good safety for community ambulator. Pt was seen for PT eval only. Had OT referral but does not feel he needs evaluation and PT in agreement. ? ? ? ?REHAB POTENTIAL: Excellent ? ?CLINICAL DECISION MAKING: Stable/uncomplicated ? ?EVALUATION COMPLEXITY: Low ? ?PLAN: ?PT FREQUENCY: one time visit ? ?PT DURATION: other: eval only ? ? ?PLAN FOR NEXT SESSION: Eval only ? ? ?Electa Sniff, PT, DPT, NCS ?06/08/2021, 10:57 AM ? ? ? ? ? ? ? ?

## 2021-06-09 LAB — CUP PACEART INCLINIC DEVICE CHECK
Date Time Interrogation Session: 20230421142035
Implantable Pulse Generator Implant Date: 20230404

## 2021-06-09 NOTE — Progress Notes (Signed)
ILR wound check in clinic. Steri strips removed. Wound well healed. Patient had accidently disabled bluetooth and had not transmitted since 4/12. Reestablished connection today in clinic. Will check for transmission in the morning.  No episodes. Questions answered. ?

## 2021-06-27 ENCOUNTER — Ambulatory Visit (INDEPENDENT_AMBULATORY_CARE_PROVIDER_SITE_OTHER): Payer: 59

## 2021-06-27 DIAGNOSIS — I639 Cerebral infarction, unspecified: Secondary | ICD-10-CM

## 2021-06-28 LAB — CUP PACEART REMOTE DEVICE CHECK
Date Time Interrogation Session: 20230509163711
Implantable Pulse Generator Implant Date: 20230404

## 2021-07-10 NOTE — Progress Notes (Signed)
[facility] Summary Report / Loop Recorder 

## 2021-07-20 ENCOUNTER — Encounter: Payer: Self-pay | Admitting: Neurology

## 2021-07-20 ENCOUNTER — Ambulatory Visit: Payer: 59 | Admitting: Neurology

## 2021-07-20 VITALS — BP 141/78 | HR 65 | Ht 73.0 in | Wt 197.0 lb

## 2021-07-20 DIAGNOSIS — E782 Mixed hyperlipidemia: Secondary | ICD-10-CM | POA: Diagnosis not present

## 2021-07-20 DIAGNOSIS — I639 Cerebral infarction, unspecified: Secondary | ICD-10-CM | POA: Diagnosis not present

## 2021-07-20 NOTE — Progress Notes (Signed)
[facility] [address] [place]. [place] [address] [phone]       OFFICE FOLLOW-UP NOTE  Mr. GARAN FRAPPIER Date of Birth:  [birth year] Medical Record Number:  [id number]   HPI: Mr. Strassman is a 56 year old pleasant Caucasian male seen today for initial office follow-up visit following hospital admission for stroke in April 2023.  He is accompanied by his wife.  History is obtained from them and review of electronic medical records and opossum reviewed pertinent available imaging films in PACS.  He has no significant past medical history except remote migraine headaches and peptic ulcer.  He presented on 05/21/2021 with acute on new onset of confusion, disorientation, blurred vision and dizziness.  He was unable to remember the password of his phone while walking out of the house could not recognize his car.  He presented outside the window for thrombolysis.  CT head showed no acute abnormality.  CT angiogram showed no large vessel stenosis or occlusion but there was irregular and slightly beaded appearance of cervical carotids raising question of fibromuscular dysplasia.  MRI scan showed bilateral scattered punctate cerebellar and occipital pole infarcts.  LDL cholesterol 149 mg percent.  Hemoglobin A1c was 5.6.  Echocardiogram showed hyperdynamic LV with ejection fraction of 75% with no clot.  Cardiac monitoring during hospitalization did not reveal any arrhythmias.  TEE showed no evidence of PFO clot or cardiac source of embolism.  Patient had loop recorder inserted and so far paroxysmal A-fib has not yet been found.  Homocystine level was normal.  Anticardiolipin antibodies were normal.  Patient has no prior history of DVT, pulmonary embolism.  He does have family history of MHTR mutation in  his mother.  Patient has not been tested for this but is homocystine level was normal.  Patient was discharged on aspirin and Plavix for 3 weeks and then aspirin alone which is tolerating well  without bruising or bleeding.  His blood pressure under good control though it is borderline today in office at 141/78.  He is tolerating Lipitor 80 mg well without any muscle aches or pains or side effects but has not yet had follow-up lipid profile checked.  He states has not had migraine in years.  He has no new complaints.  He does get tired more easily and fatigues easily but denies any focal neurological symptoms and states he is made a full recovery.  ROS:   14 system review of systems is positive for confusion, disorientation, memory difficulties, dizziness, blurred vision all other systems negative  PMH:  Past Medical History:  Diagnosis Date   Allergic rhinitis, cause unspecified    Hemorrhoids    Migraine    Ulcer     Social History:  Social History   Socioeconomic History   Marital status: Married    Spouse name: Stanton Kidney   Number of children: 3   Years of education: Not on file   Highest education level: Not on file  Occupational History    Employer: [employer]    Comment: Patent examiner  Tobacco Use   Smoking status: Never   Smokeless tobacco: Never  Substance and Sexual Activity   Alcohol use: Never    Alcohol/week: 0.0 standard drinks   Drug use: Never   Sexual activity: Yes    Partners: Female  Other Topics Concern   Not on file  Social History Narrative   Married, 3 adopted children   Hydrographic surveyor   Regular exercise- no   Right handed  Social Determinants of Health   Financial Resource Strain: Not on file  Food Insecurity: Not on file  Transportation Needs: Not on file  Physical Activity: Not on file  Stress: Not on file  Social Connections: Not on file  Intimate Partner Violence: Not on file    Medications:   Current Outpatient Medications on File Prior to Visit  Medication Sig Dispense Refill   Ascorbic Acid (VITAMIN C PO) Take 1 tablet by mouth daily.     aspirin EC 81 MG tablet Take 1 tablet (81 mg total) by mouth daily.  Swallow whole. 30 tablet 11   atorvastatin (LIPITOR) 80 MG tablet Take 1 tablet (80 mg total) by mouth daily. 30 tablet 2   fluocinonide ointment (LIDEX) 0.05 % Apply topically 2 (two) times daily. 60 g 0   fluticasone (FLONASE) 50 MCG/ACT nasal spray Place 1-2 sprays into both nostrils daily. 16 g 0   SUMAtriptan Succinate Refill 6 MG/0.5ML SOCT Inject 1 Syringe into the skin as needed. Inject one syringe subcutaneously at onset of headache. May repeat in 2 hours prn x 1 2.5 mL 0   No current facility-administered medications on file prior to visit.    Allergies:  No Known Allergies  Physical Exam General: well developed, well nourished middle-aged Caucasian male, seated, in no evident distress Head: head normocephalic and atraumatic.  Neck: supple with no carotid or supraclavicular bruits Cardiovascular: regular rate and rhythm, no murmurs Musculoskeletal: no deformity Skin:  no rash/petichiae Vascular:  Normal pulses all extremities Vitals:   07/20/21 0821  BP: (!) 141/78  Pulse: 65   Neurologic Exam Mental Status: Awake and fully alert. Oriented to place and time. Recent and remote memory intact. Attention span, concentration and fund of knowledge appropriate. Mood and affect appropriate.  Cranial Nerves: Fundoscopic exam reveals sharp disc margins. Pupils equal, briskly reactive to light. Extraocular movements full without nystagmus. Visual fields full to confrontation. Hearing intact. Facial sensation intact. Face, tongue, palate moves normally and symmetrically.  Motor: Normal bulk and tone. Normal strength in all tested extremity muscles. Sensory.: intact to touch ,pinprick .position and vibratory sensation.  Coordination: Rapid alternating movements normal in all extremities. Finger-to-nose and heel-to-shin performed accurately bilaterally. Gait and Station: Arises from chair without difficulty. Stance is normal. Gait demonstrates normal stride length and balance . Able to  heel, toe and tandem walk without difficulty.  Reflexes: 1+ and symmetric. Toes downgoing.   NIHSS  0 Modified Rankin  1   ASSESSMENT: 56 year old Caucasian male with bilateral cerebellar and occipital embolic infarcts in April 2023 of cryptogenic etiology.  Vascular risk factors of hyperlipidemia only.  Abnormal CT angiogram showing irregularities and mild beading of cervical carotids suggesting fibromuscular dysplasia but posterior circulation vasculature looks normal.  Family history of heterozygous state for MH TR mutation but patient's homocystine level was normal     PLAN: I had a long d/w patient and his wife about his recent cryptogenic stroke, risk for recurrent stroke/TIAs, personally independently reviewed imaging studies and stroke evaluation results and answered questions.Continue aspirin 81 mg daily  for secondary stroke prevention and maintain strict control of hypertension with blood pressure goal below 130/90, diabetes with hemoglobin A1c goal below 6.5% and lipids with LDL cholesterol goal below 70 mg/dL. I also advised the patient to eat a healthy diet with plenty of whole grains, cereals, fruits and vegetables, exercise regularly and maintain ideal body weight .check follow-up lipid profile today followup in the future with me in 6 months or  call earlier if necessary. Greater than 50% of time during this 35 minute visit was spent on counseling,explanation of diagnosis cryptogenic strokes, fibromuscular dysplasia, planning of further management, discussion with patient and family and coordination of care Delia Heady, MD Note: This document was prepared with digital dictation and possible smart phrase technology. Any transcriptional errors that result from this process are unintentional

## 2021-07-20 NOTE — Patient Instructions (Signed)
I had a long d/w patient and his wife about his recent cryptogenic stroke, risk for recurrent stroke/TIAs, personally independently reviewed imaging studies and stroke evaluation results and answered questions.Continue aspirin 81 mg daily  for secondary stroke prevention and maintain strict control of hypertension with blood pressure goal below 130/90, diabetes with hemoglobin A1c goal below 6.5% and lipids with LDL cholesterol goal below 70 mg/dL. I also advised the patient to eat a healthy diet with plenty of whole grains, cereals, fruits and vegetables, exercise regularly and maintain ideal body weight .check follow-up lipid profile today followup in the future with me in 6 months or call earlier if necessary. Stroke Prevention Some medical conditions and behaviors can lead to a higher chance of having a stroke. You can help prevent a stroke by eating healthy, exercising, not smoking, and managing any medical conditions you have. Stroke is a leading cause of functional impairment. Primary prevention is particularly important because a majority of strokes are first-time events. Stroke changes the lives of not only those who experience a stroke but also their family and other caregivers. How can this condition affect me? A stroke is a medical emergency and should be treated right away. A stroke can lead to brain damage and can sometimes be life-threatening. If a person gets medical treatment right away, there is a better chance of surviving and recovering from a stroke. What can increase my risk? The following medical conditions may increase your risk of a stroke: Cardiovascular disease. High blood pressure (hypertension). Diabetes. High cholesterol. Sickle cell disease. Blood clotting disorders (hypercoagulable state). Obesity. Sleep disorders (obstructive sleep apnea). Other risk factors include: Being older than age 36. Having a history of blood clots, stroke, or mini-stroke (transient ischemic  attack, TIA). Genetic factors, such as race, ethnicity, or a family history of stroke. Smoking cigarettes or using other tobacco products. Taking birth control pills, especially if you also use tobacco. Heavy use of alcohol or drugs, especially cocaine and methamphetamine. Physical inactivity. What actions can I take to prevent this? Manage your health conditions High cholesterol levels. Eating a healthy diet is important for preventing high cholesterol. If cholesterol cannot be managed through diet alone, you may need to take medicines. Take any prescribed medicines to control your cholesterol as told by your health care provider. Hypertension. To reduce your risk of stroke, try to keep your blood pressure below 130/80. Eating a healthy diet and exercising regularly are important for controlling blood pressure. If these steps are not enough to manage your blood pressure, you may need to take medicines. Take any prescribed medicines to control hypertension as told by your health care provider. Ask your health care provider if you should monitor your blood pressure at home. Have your blood pressure checked every year, even if your blood pressure is normal. Blood pressure increases with age and some medical conditions. Diabetes. Eating a healthy diet and exercising regularly are important parts of managing your blood sugar (glucose). If your blood sugar cannot be managed through diet and exercise, you may need to take medicines. Take any prescribed medicines to control your diabetes as told by your health care provider. Get evaluated for obstructive sleep apnea. Talk to your health care provider about getting a sleep evaluation if you snore a lot or have excessive sleepiness. Make sure that any other medical conditions you have, such as atrial fibrillation or atherosclerosis, are managed. Nutrition Follow instructions from your health care provider about what to eat or drink to help manage  your  health condition. These instructions may include: Reducing your daily calorie intake. Limiting how much salt (sodium) you use to 1,500 milligrams (mg) each day. Using only healthy fats for cooking, such as olive oil, canola oil, or sunflower oil. Eating healthy foods. You can do this by: Choosing foods that are high in fiber, such as whole grains, and fresh fruits and vegetables. Eating at least 5 servings of fruits and vegetables a day. Try to fill one-half of your plate with fruits and vegetables at each meal. Choosing lean protein foods, such as lean cuts of meat, poultry without skin, fish, tofu, beans, and nuts. Eating low-fat dairy products. Avoiding foods that are high in sodium. This can help lower blood pressure. Avoiding foods that have saturated fat, trans fat, and cholesterol. This can help prevent high cholesterol. Avoiding processed and prepared foods. Counting your daily carbohydrate intake.  Lifestyle If you drink alcohol: Limit how much you have to: 0-1 drink a day for women who are not pregnant. 0-2 drinks a day for men. Know how much alcohol is in your drink. In the [place] one drink equals one 12 oz bottle of beer (384m), one 5 oz glass of wine (1454m, or one 1 oz glass of hard liquor (4443m Do not use any products that contain nicotine or tobacco. These products include cigarettes, chewing tobacco, and vaping devices, such as e-cigarettes. If you need help quitting, ask your health care provider. Avoid secondhand smoke. Do not use drugs. Activity  Try to stay at a healthy weight. Get at least 30 minutes of exercise on most days, such as: Fast walking. Biking. Swimming. Medicines Take over-the-counter and prescription medicines only as told by your health care provider. Aspirin or blood thinners (antiplatelets or anticoagulants) may be recommended to reduce your risk of forming blood clots that can lead to stroke. Avoid taking birth control pills. Talk to your  health care provider about the risks of taking birth control pills if: You are over 35 44ars old. You smoke. You get very bad headaches. You have had a blood clot. Where to find more information American Stroke Association: www.strokeassociation.org Get help right away if: You or a loved one has any symptoms of a stroke. "BE FAST" is an easy way to remember the main warning signs of a stroke: B - Balance. Signs are dizziness, sudden trouble walking, or loss of balance. E - Eyes. Signs are trouble seeing or a sudden change in vision. F - Face. Signs are sudden weakness or numbness of the face, or the face or eyelid drooping on one side. A - Arms. Signs are weakness or numbness in an arm. This happens suddenly and usually on one side of the body. S - Speech. Signs are sudden trouble speaking, slurred speech, or trouble understanding what people say. T - Time. Time to call emergency services. Write down what time symptoms started. You or a loved one has other signs of a stroke, such as: A sudden, severe headache with no known cause. Nausea or vomiting. Seizure. These symptoms may represent a serious problem that is an emergency. Do not wait to see if the symptoms will go away. Get medical help right away. Call your local emergency services (911 in the [place] Do not drive yourself to the hospital. Summary You can help to prevent a stroke by eating healthy, exercising, not smoking, limiting alcohol intake, and managing any medical conditions you may have. Do not use any products that contain nicotine or tobacco. These  include cigarettes, chewing tobacco, and vaping devices, such as e-cigarettes. If you need help quitting, ask your health care provider. Remember "BE FAST" for warning signs of a stroke. Get help right away if you or a loved one has any of these signs. This information is not intended to replace advice given to you by your health care provider. Make sure you discuss any questions you  have with your health care provider. Document Revised: 09/07/2019 Document Reviewed: 09/07/2019 Elsevier Patient Education  [employer].

## 2021-07-21 LAB — LIPID PANEL
Chol/HDL Ratio: 2.9 ratio (ref 0.0–5.0)
Cholesterol, Total: 100 mg/dL (ref 100–199)
HDL: 34 mg/dL — ABNORMAL LOW (ref >39)
LDL Chol Calc (NIH): 52 mg/dL (ref 0–99)
Triglycerides: 60 mg/dL (ref 0–149)
VLDL Cholesterol Cal: 14 mg/dL (ref 5–40)

## 2021-07-31 ENCOUNTER — Ambulatory Visit (INDEPENDENT_AMBULATORY_CARE_PROVIDER_SITE_OTHER): Payer: 59

## 2021-07-31 DIAGNOSIS — I639 Cerebral infarction, unspecified: Secondary | ICD-10-CM

## 2021-08-01 LAB — CUP PACEART REMOTE DEVICE CHECK
Date Time Interrogation Session: 20230611163638
Implantable Pulse Generator Implant Date: 20230404

## 2021-08-21 NOTE — Progress Notes (Signed)
[facility] Summary Report / Loop Recorder 

## 2021-08-24 ENCOUNTER — Encounter: Payer: Self-pay | Admitting: Family Medicine

## 2021-08-24 MED ORDER — ATORVASTATIN CALCIUM 80 MG PO TABS
80.0000 mg | ORAL_TABLET | Freq: Every day | ORAL | 3 refills | Status: DC
Start: 2021-08-24 — End: 2022-08-28

## 2021-09-04 ENCOUNTER — Ambulatory Visit (INDEPENDENT_AMBULATORY_CARE_PROVIDER_SITE_OTHER): Payer: 59

## 2021-09-04 DIAGNOSIS — I639 Cerebral infarction, unspecified: Secondary | ICD-10-CM

## 2021-09-04 LAB — CUP PACEART REMOTE DEVICE CHECK
Date Time Interrogation Session: 20230714163736
Implantable Pulse Generator Implant Date: 20230404

## 2021-10-05 LAB — CUP PACEART REMOTE DEVICE CHECK
Date Time Interrogation Session: 20230816163625
Implantable Pulse Generator Implant Date: 20230404

## 2021-10-06 NOTE — Progress Notes (Signed)
[facility] Summary Report / Loop Recorder 

## 2021-10-09 ENCOUNTER — Ambulatory Visit (INDEPENDENT_AMBULATORY_CARE_PROVIDER_SITE_OTHER): Payer: 59

## 2021-10-09 DIAGNOSIS — I639 Cerebral infarction, unspecified: Secondary | ICD-10-CM | POA: Diagnosis not present

## 2021-10-17 NOTE — Progress Notes (Unsigned)
    Ian Golden T. Ian Bayly, MD, CAQ Sports Medicine [facility] at [facility] [address] [place], [address]  Phone: [phone]  FAX: [phone]  Ian Golden - 56 y.o. male  MRN [id number]  Date of Birth: 01/04/1966  Date: 10/18/2021  PCP: Hannah Beat, MD  Referral: Hannah Beat, MD  No chief complaint on file.  Subjective:   Ian Golden is a 56 y.o. very pleasant male patient with There is no height or weight on file to calculate BMI. who presents with the following:  He is a very well-known patient of many years who is generally been quite healthy, but he did sustain a stroke in an earlier spring.  He presents today with a sensation of some clogged, full ears.    Review of Systems is noted in the HPI, as appropriate  Objective:   There were no vitals taken for this visit.  GEN: No acute distress; alert,appropriate. PULM: Breathing comfortably in no respiratory distress PSYCH: Normally interactive.   Laboratory and Imaging Data:  Assessment and Plan:   ***

## 2021-10-18 ENCOUNTER — Ambulatory Visit (INDEPENDENT_AMBULATORY_CARE_PROVIDER_SITE_OTHER): Payer: 59 | Admitting: Family Medicine

## 2021-10-18 ENCOUNTER — Encounter: Payer: Self-pay | Admitting: Family Medicine

## 2021-10-18 VITALS — BP 120/70 | HR 65 | Temp 98.3°F | Ht 73.0 in | Wt 191.4 lb

## 2021-10-18 DIAGNOSIS — H9313 Tinnitus, bilateral: Secondary | ICD-10-CM | POA: Diagnosis not present

## 2021-10-18 DIAGNOSIS — H6983 Other specified disorders of Eustachian tube, bilateral: Secondary | ICD-10-CM

## 2021-10-18 MED ORDER — PREDNISONE 20 MG PO TABS: ORAL_TABLET | ORAL | 0 refills | Status: DC

## 2021-10-18 MED ORDER — SUMATRIPTAN SUCCINATE REFILL 6 MG/0.5ML ~~LOC~~ SOCT: 1.0000 | SUBCUTANEOUS | 5 refills | Status: DC | PRN

## 2021-10-18 NOTE — Patient Instructions (Signed)
Eustachian Tube Dysfunction: There is a tube that connects between the sinuses and behind the ear called the "eustachian tube." Sometimes when you have allergies, a cold, or nasal congestion for any reason this tube can get blocked and pressure cannot equalize in your ears. (Like if you swim in deep water) This can also trap fluid behind the ear and give you a full, pressure-like sensation that is uncomfortable, but it is not an ear infection.  Recommendations: Afrin Nasal Spray: 2 sprays twice a day for a maximum of 3-4 days (longer than this and your nose gets addicted, and you have rebound swelling that makes it worse.) Anti-Histamine: Allegra, Zyrtec, or Claritin. All over the counter now and once a day. - generic LORATIDINE  Nasal steroid. Nasacort is over the counter now. About 10 prescription ones exist.  If you develop fever > 100.4, then things can change fluid behind the ear does increase your risk of developing an ear infection.

## 2021-10-31 ENCOUNTER — Encounter: Payer: Self-pay | Admitting: Family Medicine

## 2021-10-31 DIAGNOSIS — H6983 Other specified disorders of Eustachian tube, bilateral: Secondary | ICD-10-CM

## 2021-10-31 DIAGNOSIS — H9313 Tinnitus, bilateral: Secondary | ICD-10-CM

## 2021-10-31 DIAGNOSIS — H9203 Otalgia, bilateral: Secondary | ICD-10-CM

## 2021-11-01 ENCOUNTER — Ambulatory Visit (INDEPENDENT_AMBULATORY_CARE_PROVIDER_SITE_OTHER): Payer: 59

## 2021-11-01 DIAGNOSIS — Z23 Encounter for immunization: Secondary | ICD-10-CM | POA: Diagnosis not present

## 2021-11-05 NOTE — Progress Notes (Signed)
[facility] Summary Report / Loop Recorder 

## 2021-11-13 ENCOUNTER — Ambulatory Visit (INDEPENDENT_AMBULATORY_CARE_PROVIDER_SITE_OTHER): Payer: 59

## 2021-11-13 DIAGNOSIS — I639 Cerebral infarction, unspecified: Secondary | ICD-10-CM

## 2021-11-13 LAB — CUP PACEART REMOTE DEVICE CHECK
Date Time Interrogation Session: 20230918164104
Implantable Pulse Generator Implant Date: 20230404

## 2021-11-23 NOTE — Progress Notes (Signed)
[facility] Summary Report / Loop Recorder 

## 2021-12-18 ENCOUNTER — Ambulatory Visit (INDEPENDENT_AMBULATORY_CARE_PROVIDER_SITE_OTHER): Payer: 59

## 2021-12-18 DIAGNOSIS — I639 Cerebral infarction, unspecified: Secondary | ICD-10-CM

## 2021-12-18 LAB — CUP PACEART REMOTE DEVICE CHECK
Date Time Interrogation Session: 20231029231652
Implantable Pulse Generator Implant Date: 20230404

## 2022-01-17 NOTE — Progress Notes (Signed)
[facility] Summary Report / Loop Recorder 

## 2022-01-22 ENCOUNTER — Ambulatory Visit (INDEPENDENT_AMBULATORY_CARE_PROVIDER_SITE_OTHER): Payer: 59

## 2022-01-22 DIAGNOSIS — I639 Cerebral infarction, unspecified: Secondary | ICD-10-CM

## 2022-01-22 LAB — CUP PACEART REMOTE DEVICE CHECK
Date Time Interrogation Session: 20231203232028
Implantable Pulse Generator Implant Date: 20230404

## 2022-01-24 NOTE — Progress Notes (Unsigned)
No chief complaint on file.   HISTORY OF PRESENT ILLNESS:  01/24/22 ALL:  Ian Golden is a 56 y.o. male here today for follow up for history of CVA 05/2021. He was last seen 07/2021 by Dr Pearlean Brownie and reported feeling back to baseline with no deficits. Loop recorder is monitored by cardiology. No events of atrial fib noted. He continues atorvastatin 80mg  and asa 81mg  daily. He is followed regularly by PCP. Last LDL 52.    HISTORY (copied from Dr previous note)  HPI: Ian Golden is a 56 year old pleasant Caucasian male seen today for initial office follow-up visit following hospital admission for stroke in April 2023.  He is accompanied by his wife.  History is obtained from them and review of electronic medical records and opossum reviewed pertinent available imaging films in PACS.  He has no significant past medical history except remote migraine headaches and peptic ulcer.  He presented on 05/21/2021 with acute on new onset of confusion, disorientation, blurred vision and dizziness.  He was unable to remember the password of his phone while walking out of the house could not recognize his car.  He presented outside the window for thrombolysis.  CT head showed no acute abnormality.  CT angiogram showed no large vessel stenosis or occlusion but there was irregular and slightly beaded appearance of cervical carotids raising question of fibromuscular dysplasia.  MRI scan showed bilateral scattered punctate cerebellar and occipital pole infarcts.  LDL cholesterol 149 mg percent.  Hemoglobin A1c was 5.6.  Echocardiogram showed hyperdynamic LV with ejection fraction of 75% with no clot.  Cardiac monitoring during hospitalization did not reveal any arrhythmias.  TEE showed no evidence of PFO clot or cardiac source of embolism.  Patient had loop recorder inserted and so far paroxysmal A-fib has not yet been found.  Homocystine level was normal.  Anticardiolipin antibodies were normal.  Patient has no prior  history of DVT, pulmonary embolism.  He does have family history of MHTR mutation in  his mother.  Patient has not been tested for this but is homocystine level was normal.  Patient was discharged on aspirin and Plavix for 3 weeks and then aspirin alone which is tolerating well without bruising or bleeding.  His blood pressure under good control though it is borderline today in office at 141/78.  He is tolerating Lipitor 80 mg well without any muscle aches or pains or side effects but has not yet had follow-up lipid profile checked.  He states has not had migraine in years.  He has no new complaints.  He does get tired more easily and fatigues easily but denies any focal neurological symptoms and states he is made a full recovery.    REVIEW OF SYSTEMS: Out of a complete 14 system review of symptoms, the patient complains only of the following symptoms, and all other reviewed systems are negative.   ALLERGIES: No Known Allergies   HOME MEDICATIONS: Outpatient Medications Prior to Visit  Medication Sig Dispense Refill   Ascorbic Acid (VITAMIN C PO) Take 1 tablet by mouth daily.     aspirin EC 81 MG tablet Take 1 tablet (81 mg total) by mouth daily. Swallow whole. 30 tablet 11   atorvastatin (LIPITOR) 80 MG tablet Take 1 tablet (80 mg total) by mouth daily. 90 tablet 3   fluocinonide ointment (LIDEX) 0.05 % Apply topically 2 (two) times daily. 60 g 0   fluticasone (FLONASE) 50 MCG/ACT nasal spray Place 1-2 sprays into both  nostrils daily. 16 g 0   predniSONE (DELTASONE) 20 MG tablet 2 tabs po daily for 5 days, then 1 tab po daily for 5 days 15 tablet 0   SUMAtriptan Succinate Refill 6 MG/0.5ML SOCT Inject 1 Syringe into the skin as needed. Inject one syringe subcutaneously at onset of headache. May repeat in 2 hours prn x 1 2.5 mL 5   No facility-administered medications prior to visit.     PAST MEDICAL HISTORY: Past Medical History:  Diagnosis Date   Allergic rhinitis, cause unspecified     Hemorrhoids    Migraine    Ulcer      PAST SURGICAL HISTORY: Past Surgical History:  Procedure Laterality Date   BUBBLE STUDY  05/23/2021   Procedure: BUBBLE STUDY;  Surgeon: Wendall Stade, MD;  Location: [facility] ENDOSCOPY;  Service: Cardiovascular;;   LOOP RECORDER INSERTION N/A 05/23/2021   Procedure: LOOP RECORDER INSERTION;  Surgeon: Regan Lemming, MD;  Location: [facility] INVASIVE CV LAB;  Service: Cardiovascular;  Laterality: N/A;   TEE WITHOUT CARDIOVERSION N/A 05/23/2021   Procedure: TRANSESOPHAGEAL ECHOCARDIOGRAM (TEE);  Surgeon: Wendall Stade, MD;  Location[facility] ENDOSCOPY;  Service: Cardiovascular;  Laterality: N/A;   WISDOM TOOTH EXTRACTION     with sedation     FAMILY HISTORY: Family History  Problem Relation Age of Onset   Colon polyps Father    Heart attack Mother    Heart disease Mother    Other Mother        coroner said there were signs of possible start of dementia    Colon cancer Neg Hx    Esophageal cancer Neg Hx    Rectal cancer Neg Hx    Stomach cancer Neg Hx      SOCIAL HISTORY: Social History   Socioeconomic History   Marital status: Married    Spouse name: Stanton Kidney   Number of children: 3   Years of education: Not on file   Highest education level: Not on file  Occupational History    Employer: [employer]    Comment: Patent examiner  Tobacco Use   Smoking status: Never   Smokeless tobacco: Never  Substance and Sexual Activity   Alcohol use: Never    Alcohol/week: 0.0 standard drinks of alcohol   Drug use: Never   Sexual activity: Yes    Partners: Female  Other Topics Concern   Not on file  Social History Narrative   Married, 3 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 There is no height or weight on file to calculate BMI.  Generalized: Well developed, in no acute distress  Cardiology: normal rate and rhythm, no murmur auscultated  Respiratory: clear to auscultation bilaterally    Neurological examination  Mentation: Alert oriented to time, place, history taking. Follows all commands speech and language fluent Cranial nerve II-XII: Pupils were equal round reactive to light. Extraocular movements were full, visual field were full on confrontational test. Facial sensation and strength were normal. Uvula tongue midline. Head turning and shoulder shrug  were normal and symmetric. Motor: The  motor testing reveals 5 over 5 strength of all 4 extremities. Good symmetric motor tone is noted throughout.  Sensory: Sensory testing is intact to soft touch on all 4 extremities. No evidence of extinction is noted.  Coordination: Cerebellar testing reveals good finger-nose-finger and heel-to-shin bilaterally.  Gait and station: Gait is normal. Tandem gait is normal. Romberg is negative. No drift is seen.  Reflexes: Deep tendon reflexes are symmetric and normal bilaterally.    DIAGNOSTIC DATA (LABS, IMAGING, TESTING) - I reviewed patient records, labs, notes, testing and imaging myself where available.  Lab Results  Component Value Date   WBC 8.7 05/22/2021   HGB 13.5 05/22/2021   HCT 39.5 05/22/2021   MCV 88.8 05/22/2021   PLT 182 05/22/2021      Component Value Date/Time   NA 138 05/22/2021 0545   NA 141 11/18/2019 1321   K 3.9 05/22/2021 0545   CL 110 05/22/2021 0545   CO2 22 05/22/2021 0545   GLUCOSE 97 05/22/2021 0545   BUN 14 05/22/2021 0545   BUN 21 11/18/2019 1321   CREATININE 1.06 05/22/2021 0545   CALCIUM 8.6 (L) 05/22/2021 0545   PROT 6.4 (L) 05/21/2021 1000   ALBUMIN 3.7 05/21/2021 1000   AST 25 05/21/2021 1000   ALT 22 05/21/2021 1000    ALKPHOS 75 05/21/2021 1000   BILITOT 0.3 05/21/2021 1000   GFRNONAA >60 05/22/2021 0545   GFRAA 95 11/18/2019 1321   Lab Results  Component Value Date   CHOL 100 07/20/2021   HDL 34 (L) 07/20/2021   LDLCALC 52 07/20/2021   LDLDIRECT 146.8 (H) 05/21/2021   TRIG 60 07/20/2021   CHOLHDL 2.9 07/20/2021   Lab Results  Component Value Date   HGBA1C 5.6 05/21/2021   Lab Results  Component Value Date   VITAMINB12 473 11/18/2019   Lab Results  Component Value Date   TSH 1.652 05/21/2021       11/18/2019   11:42 AM  MMSE - Mini Mental State Exam  Orientation to time 5  Orientation to Place 5  Registration 3  Attention/ Calculation 5  Recall 3  Language- name 2 objects 2  Language- repeat 1  Language- follow 3 step command 3  Language- read & follow direction 1  Write a sentence 1  Copy design 1  Total score 30         No data to display           ASSESSMENT AND PLAN  56 y.o. year old male  has a past medical history of Allergic rhinitis, cause unspecified, Hemorrhoids, Migraine, and Ulcer. here with    No diagnosis found.  [name] ***.  Healthy lifestyle habits encouraged. *** will follow up with PCP as directed. *** will return to see me in ***, sooner if needed. *** verbalizes understanding and agreement with this plan.   No orders of the defined types were placed in this encounter.    No orders of the defined types were placed in this encounter.    Shawnie Dapper, MSN, FNP-C 01/24/2022, 8:04 AM  [facility] [address], [address] [place], [place] [address] [phone]

## 2022-01-24 NOTE — Patient Instructions (Signed)
Below is our plan:  We will continue to monitor. Continue atorvastatin and asa daily.   Please make sure you are staying well hydrated. I recommend 50-60 ounces daily. Well balanced diet and regular exercise encouraged. Consistent sleep schedule with 6-8 hours recommended.   Please continue follow up with care team as directed.   Follow up with neurology as needed   You may receive a survey regarding today's visit. I encourage you to leave honest feed back as I do use this information to improve patient care. Thank you for seeing me today!

## 2022-01-25 ENCOUNTER — Ambulatory Visit: Payer: 59 | Admitting: Family Medicine

## 2022-01-25 ENCOUNTER — Encounter: Payer: Self-pay | Admitting: Family Medicine

## 2022-01-25 VITALS — BP 134/82 | HR 83 | Ht 73.0 in | Wt 198.0 lb

## 2022-01-25 DIAGNOSIS — E782 Mixed hyperlipidemia: Secondary | ICD-10-CM

## 2022-01-25 DIAGNOSIS — I639 Cerebral infarction, unspecified: Secondary | ICD-10-CM | POA: Diagnosis not present

## 2022-02-26 ENCOUNTER — Ambulatory Visit (INDEPENDENT_AMBULATORY_CARE_PROVIDER_SITE_OTHER): Payer: 59

## 2022-02-26 DIAGNOSIS — I639 Cerebral infarction, unspecified: Secondary | ICD-10-CM | POA: Diagnosis not present

## 2022-02-27 LAB — CUP PACEART REMOTE DEVICE CHECK
Date Time Interrogation Session: 20240107232719
Implantable Pulse Generator Implant Date: 20230404

## 2022-03-01 NOTE — Progress Notes (Signed)
[facility] Summary Report / Loop Recorder 

## 2022-04-01 LAB — CUP PACEART REMOTE DEVICE CHECK
Date Time Interrogation Session: 20240209231229
Implantable Pulse Generator Implant Date: 20230404

## 2022-04-02 ENCOUNTER — Ambulatory Visit: Payer: 59

## 2022-04-02 DIAGNOSIS — I639 Cerebral infarction, unspecified: Secondary | ICD-10-CM | POA: Diagnosis not present

## 2022-04-02 NOTE — Progress Notes (Signed)
[facility] Summary Report / Loop Recorder 

## 2022-05-07 ENCOUNTER — Ambulatory Visit (INDEPENDENT_AMBULATORY_CARE_PROVIDER_SITE_OTHER): Payer: 59

## 2022-05-07 DIAGNOSIS — I639 Cerebral infarction, unspecified: Secondary | ICD-10-CM

## 2022-05-08 LAB — CUP PACEART REMOTE DEVICE CHECK
Date Time Interrogation Session: 20240317232546
Implantable Pulse Generator Implant Date: 20230404

## 2022-05-15 NOTE — Progress Notes (Signed)
[facility] Summary Report / Loop Recorder 

## 2022-05-22 ENCOUNTER — Other Ambulatory Visit: Payer: Self-pay | Admitting: Family Medicine

## 2022-05-22 DIAGNOSIS — Z131 Encounter for screening for diabetes mellitus: Secondary | ICD-10-CM

## 2022-05-22 DIAGNOSIS — Z79899 Other long term (current) drug therapy: Secondary | ICD-10-CM

## 2022-05-22 DIAGNOSIS — Z125 Encounter for screening for malignant neoplasm of prostate: Secondary | ICD-10-CM

## 2022-05-22 DIAGNOSIS — E782 Mixed hyperlipidemia: Secondary | ICD-10-CM

## 2022-05-23 ENCOUNTER — Encounter: Payer: Self-pay | Admitting: Neurology

## 2022-05-25 ENCOUNTER — Encounter: Payer: Self-pay | Admitting: Family Medicine

## 2022-05-29 ENCOUNTER — Other Ambulatory Visit (INDEPENDENT_AMBULATORY_CARE_PROVIDER_SITE_OTHER): Payer: 59

## 2022-05-29 DIAGNOSIS — Z125 Encounter for screening for malignant neoplasm of prostate: Secondary | ICD-10-CM | POA: Diagnosis not present

## 2022-05-29 DIAGNOSIS — Z79899 Other long term (current) drug therapy: Secondary | ICD-10-CM | POA: Diagnosis not present

## 2022-05-29 DIAGNOSIS — Z131 Encounter for screening for diabetes mellitus: Secondary | ICD-10-CM

## 2022-05-29 DIAGNOSIS — E782 Mixed hyperlipidemia: Secondary | ICD-10-CM

## 2022-05-29 LAB — HEPATIC FUNCTION PANEL
ALT: 29 U/L (ref 0–53)
AST: 28 U/L (ref 0–37)
Albumin: 4.1 g/dL (ref 3.5–5.2)
Alkaline Phosphatase: 98 U/L (ref 39–117)
Bilirubin, Direct: 0.1 mg/dL (ref 0.0–0.3)
Total Bilirubin: 0.3 mg/dL (ref 0.2–1.2)
Total Protein: 6.6 g/dL (ref 6.0–8.3)

## 2022-05-29 LAB — LIPID PANEL
Cholesterol: 109 mg/dL (ref 0–200)
HDL: 37.4 mg/dL — ABNORMAL LOW (ref >39.00)
LDL Cholesterol: 63 mg/dL (ref 0–99)
NonHDL: 71.52
Total CHOL/HDL Ratio: 3
Triglycerides: 43 mg/dL (ref 0.0–149.0)
VLDL: 8.6 mg/dL (ref 0.0–40.0)

## 2022-05-29 LAB — BASIC METABOLIC PANEL
BUN: 23 mg/dL (ref 6–23)
CO2: 23 mEq/L (ref 19–32)
Calcium: 9 mg/dL (ref 8.4–10.5)
Chloride: 110 mEq/L (ref 96–112)
Creatinine, Ser: 0.96 mg/dL (ref 0.40–1.50)
GFR: 87.65 mL/min (ref >60.00)
Glucose, Bld: 94 mg/dL (ref 70–99)
Potassium: 3.9 mEq/L (ref 3.5–5.1)
Sodium: 141 mEq/L (ref 135–145)

## 2022-05-29 LAB — CBC WITH DIFFERENTIAL/PLATELET
Basophils Absolute: 0.1 10*3/uL (ref 0.0–0.1)
Basophils Relative: 1 % (ref 0.0–3.0)
Eosinophils Absolute: 0.5 10*3/uL (ref 0.0–0.7)
Eosinophils Relative: 9.3 % — ABNORMAL HIGH (ref 0.0–5.0)
HCT: 42.9 % (ref 39.0–52.0)
Hemoglobin: 14.8 g/dL (ref 13.0–17.0)
Lymphocytes Relative: 23.7 % (ref 12.0–46.0)
Lymphs Abs: 1.2 10*3/uL (ref 0.7–4.0)
MCHC: 34.6 g/dL (ref 30.0–36.0)
MCV: 87.3 fl (ref 78.0–100.0)
Monocytes Absolute: 0.4 10*3/uL (ref 0.1–1.0)
Monocytes Relative: 8.1 % (ref 3.0–12.0)
Neutro Abs: 2.9 10*3/uL (ref 1.4–7.7)
Neutrophils Relative %: 57.9 % (ref 43.0–77.0)
Platelets: 178 10*3/uL (ref 150.0–400.0)
RBC: 4.91 Mil/uL (ref 4.22–5.81)
RDW: 13.3 % (ref 11.5–15.5)
WBC: 5 10*3/uL (ref 4.0–10.5)

## 2022-05-29 LAB — HEMOGLOBIN A1C: Hgb A1c MFr Bld: 5.8 % (ref 4.6–6.5)

## 2022-05-30 LAB — PSA, TOTAL WITH REFLEX TO PSA, FREE: PSA, Total: 0.5 ng/mL (ref <=4.0)

## 2022-06-05 NOTE — Progress Notes (Unsigned)
Ian Snethen T. Ian Coward, MD, CAQ Sports Medicine [facility] [address] [place], [address]  Phone: [phone]  FAX: [phone]  MARS SCHEAFFER - 57 y.o. male  [name]  Date of Birth: 1965-08-21  Date: 06/06/2022  PCP: Hannah Beat, MD  Referral: Hannah Beat, MD  No chief complaint on file.  Patient Care Team: Hannah Beat, MD as PCP - General Subjective:   Ian Golden is a 57 y.o. pleasant patient who presents with the following:  Preventative Health Maintenance Visit:  Health Maintenance Summary Reviewed and updated, unless pt declines services.  Tobacco History Reviewed. Alcohol: No concerns, no excessive use Exercise Habits: Some activity, rec at least 30 mins 5 times a week STD concerns: no risk or activity to increase risk Drug Use: None  He did have a stroke last year, as well.  He is on Lipitor 80 and aspirin 81 mg.  Health Maintenance  Topic Date Due   COVID-19 Vaccine (4 - 2023-24 season) 10/20/2021   INFLUENZA VACCINE  09/20/2022   COLONOSCOPY (Pts 45-69yrs Insurance coverage will need to be confirmed)  03/27/2025   DTaP/Tdap/Td (3 - Td or Tdap) 07/28/2029   Hepatitis C Screening  Completed   HIV Screening  Completed   Zoster Vaccines- Shingrix  Completed   HPV VACCINES  Aged Out   Immunization History  Administered Date(s) Administered   Influenza Inj Mdck Quad Pf 11/17/2020   Influenza,inj,Quad PF,6+ Mos 01/02/2017, 11/07/2017, 11/01/2021   PFIZER(Purple Top)SARS-COV-2 Vaccination 03/26/2019, 04/16/2019, 02/08/2020   Td 07/29/2019   Tdap 04/21/2008   Zoster Recombinat (Shingrix) 03/03/2019, 08/20/2019   Patient Active Problem List   Diagnosis Date Noted   Acute CVA (cerebrovascular accident) 05/21/2021    Priority: High   Hyperlipidemia 05/21/2021    Priority: Medium    MIGRAINE WITHOUT AURA 05/03/2010    Priority: Medium    Hemorrhoids 05/19/2013   ALLERGIC RHINITIS 05/26/2009    GASTRIC ULCER, HX OF 05/26/2009    Past Medical History:  Diagnosis Date   Allergic rhinitis, cause unspecified    Hemorrhoids    Migraine    Ulcer     Past Surgical History:  Procedure Laterality Date   BUBBLE STUDY  05/23/2021   Procedure: BUBBLE STUDY;  Surgeon: Wendall Stade, MD;  Location: [facility] ENDOSCOPY;  Service: Cardiovascular;;   LOOP RECORDER INSERTION N/A 05/23/2021   Procedure: LOOP RECORDER INSERTION;  Surgeon: Regan Lemming, MD;  Location: [facility] INVASIVE CV LAB;  Service: Cardiovascular;  Laterality: N/A;   TEE WITHOUT CARDIOVERSION N/A 05/23/2021   Procedure: TRANSESOPHAGEAL ECHOCARDIOGRAM (TEE);  Surgeon: Wendall Stade, MD;  Location: [facility] ENDOSCOPY;  Service: Cardiovascular;  Laterality: N/A;   WISDOM TOOTH EXTRACTION     with sedation    Family History  Problem Relation Age of Onset   Colon polyps Father    Heart attack Mother    Heart disease Mother    Other Mother        coroner said there were signs of possible start of dementia    Colon cancer Neg Hx    Esophageal cancer Neg Hx    Rectal cancer Neg Hx    Stomach cancer Neg Hx     Social History   Social History Narrative   Married, 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  Review of Systems: Pertinent positives are listed above.  Otherwise, a full 14 point review of systems has been done in full and it is negative except where it is noted positive.  Objective:   There were no vitals taken for this visit. Ideal Body Weight:    Ideal Body Weight:   No results found.    02/06/2021    2:06 PM 07/29/2019    8:35 AM 12/17/2016   10:44 AM  Depression screen PHQ 2/9  Decreased Interest 0 0 0  Down, Depressed, Hopeless 0 0 0  PHQ - 2 Score 0 0 0     GEN: well developed, well nourished, no  acute distress Eyes: conjunctiva and lids normal, PERRLA, EOMI ENT: TM clear, nares clear, oral exam WNL Neck: supple, no lymphadenopathy, no thyromegaly, no JVD Pulm: clear to auscultation and percussion, respiratory effort normal CV: regular rate and rhythm, S1-S2, no murmur, rub or gallop, no bruits, peripheral pulses normal and symmetric, no cyanosis, clubbing, edema or varicosities GI: soft, non-tender; no hepatosplenomegaly, masses; active bowel sounds all quadrants GU: deferred Lymph: no cervical, axillary or inguinal adenopathy MSK: gait normal, muscle tone and strength WNL, no joint swelling, effusions, discoloration, crepitus  SKIN: clear, good turgor, color WNL, no rashes, lesions, or ulcerations Neuro: normal mental status, normal strength, sensation, and motion Psych: alert; oriented to person, place and time, normally interactive and not anxious or depressed in appearance.  All labs reviewed with patient. Results for orders placed or performed in visit on 05/29/22  PSA, Total with Reflex to PSA, Free  Result Value Ref Range   PSA, Total 0.5 < OR = 4.0 ng/mL  Lipid panel  Result Value Ref Range   Cholesterol 109 0 - 200 mg/dL   Triglycerides 40.9 0.0 - 149.0 mg/dL   HDL 81.19 (L) >14.78 mg/dL   VLDL 8.6 0.0 - 29.5 mg/dL   LDL Cholesterol 63 0 - 99 mg/dL   Total CHOL/HDL Ratio 3    NonHDL 71.52   Hemoglobin A1c  Result Value Ref Range   Hgb A1c MFr Bld 5.8 4.6 - 6.5 %  Hepatic function panel  Result Value Ref Range   Total Bilirubin 0.3 0.2 - 1.2 mg/dL   Bilirubin, Direct 0.1 0.0 - 0.3 mg/dL   Alkaline Phosphatase 98 39 - 117 U/L   AST 28 0 - 37 U/L   ALT 29 0 - 53 U/L   Total Protein 6.6 6.0 - 8.3 g/dL   Albumin 4.1 3.5 - 5.2 g/dL  CBC with Differential/Platelet  Result Value Ref Range   WBC 5.0 4.0 - 10.5 K/uL   RBC 4.91 4.22 - 5.81 Mil/uL   Hemoglobin 14.8 13.0 - 17.0 g/dL   HCT 62.1 30.8 - 65.7 %   MCV 87.3 78.0 - 100.0 fl   MCHC 34.6 30.0 - 36.0 g/dL    RDW 84.6 96.2 - 95.2 %   Platelets 178.0 150.0 - 400.0 K/uL   Neutrophils Relative % 57.9 43.0 - 77.0 %   Lymphocytes Relative 23.7 12.0 - 46.0 %   Monocytes Relative 8.1 3.0 - 12.0 %   Eosinophils Relative 9.3 (H) 0.0 - 5.0 %   Basophils Relative 1.0 0.0 - 3.0 %   Neutro Abs 2.9 1.4 - 7.7 K/uL   Lymphs Abs 1.2 0.7 - 4.0 K/uL   Monocytes Absolute 0.4 0.1 - 1.0 K/uL   Eosinophils Absolute 0.5 0.0 - 0.7 K/uL  Basophils Absolute 0.1 0.0 - 0.1 K/uL  Basic metabolic panel  Result Value Ref Range   Sodium 141 135 - 145 mEq/L   Potassium 3.9 3.5 - 5.1 mEq/L   Chloride 110 96 - 112 mEq/L   CO2 23 19 - 32 mEq/L   Glucose, Bld 94 70 - 99 mg/dL   BUN 23 6 - 23 mg/dL   Creatinine, Ser 9.14 0.40 - 1.50 mg/dL   GFR 78.29 >56.21 mL/min   Calcium 9.0 8.4 - 10.5 mg/dL    Assessment and Plan:     ICD-10-CM   1. Healthcare maintenance  Z00.00       Health Maintenance Exam: The patient's preventative maintenance and recommended screening tests for an annual wellness exam were reviewed in full today. Brought up to date unless services declined.  Counselled on the importance of diet, exercise, and its role in overall health and mortality. The patient's FH and SH was reviewed, including their home life, tobacco status, and drug and alcohol status.  Follow-up in 1 year for physical exam or additional follow-up below.  Disposition: No follow-ups on file.  No orders of the defined types were placed in this encounter.  There are no discontinued medications. No orders of the defined types were placed in this encounter.   Signed,  Elpidio Galea. Shanita Kanan, MD   Allergies as of 06/06/2022   No Known Allergies      Medication List        Accurate as of June 05, 2022  2:36 PM. If you have any questions, ask your nurse or doctor.          aspirin EC 81 MG tablet Take 1 tablet (81 mg total) by mouth daily. Swallow whole.   atorvastatin 80 MG tablet Commonly known as: LIPITOR Take 1  tablet (80 mg total) by mouth daily.   fluocinonide ointment 0.05 % Commonly known as: LIDEX Apply topically 2 (two) times daily.   fluticasone 50 MCG/ACT nasal spray Commonly known as: FLONASE Place 1-2 sprays into both nostrils daily.   SUMAtriptan Succinate Refill 6 MG/0.5ML Soct Inject 1 Syringe into the skin as needed. Inject one syringe subcutaneously at onset of headache. May repeat in 2 hours prn x 1   VITAMIN C PO Take 1 tablet by mouth daily.

## 2022-06-06 ENCOUNTER — Encounter: Payer: Self-pay | Admitting: *Deleted

## 2022-06-06 ENCOUNTER — Ambulatory Visit (INDEPENDENT_AMBULATORY_CARE_PROVIDER_SITE_OTHER): Payer: 59 | Admitting: Family Medicine

## 2022-06-06 ENCOUNTER — Encounter: Payer: Self-pay | Admitting: Family Medicine

## 2022-06-06 VITALS — BP 102/70 | HR 72 | Temp 97.9°F | Ht 73.25 in | Wt 190.5 lb

## 2022-06-06 DIAGNOSIS — R413 Other amnesia: Secondary | ICD-10-CM | POA: Diagnosis not present

## 2022-06-06 DIAGNOSIS — Z0000 Encounter for general adult medical examination without abnormal findings: Secondary | ICD-10-CM

## 2022-06-06 DIAGNOSIS — I639 Cerebral infarction, unspecified: Secondary | ICD-10-CM | POA: Diagnosis not present

## 2022-06-06 DIAGNOSIS — Z8673 Personal history of transient ischemic attack (TIA), and cerebral infarction without residual deficits: Secondary | ICD-10-CM | POA: Diagnosis not present

## 2022-06-06 LAB — VITAMIN B12: Vitamin B-12: 306 pg/mL (ref 211–911)

## 2022-06-06 LAB — T4, FREE: Free T4: 0.84 ng/dL (ref 0.60–1.60)

## 2022-06-06 LAB — T3, FREE: T3, Free: 3.1 pg/mL (ref 2.3–4.2)

## 2022-06-06 LAB — TSH: TSH: 1.65 u[IU]/mL (ref 0.35–5.50)

## 2022-06-06 NOTE — Telephone Encounter (Signed)
I spoke with Ian Golden about all of this in the office, and ordered B12 and thyroid panel.  No mood changes.

## 2022-06-11 ENCOUNTER — Ambulatory Visit (INDEPENDENT_AMBULATORY_CARE_PROVIDER_SITE_OTHER): Payer: 59

## 2022-06-11 DIAGNOSIS — I639 Cerebral infarction, unspecified: Secondary | ICD-10-CM

## 2022-06-11 LAB — CUP PACEART REMOTE DEVICE CHECK
Date Time Interrogation Session: 20240419230603
Implantable Pulse Generator Implant Date: 20230404

## 2022-06-15 ENCOUNTER — Ambulatory Visit: Payer: 59 | Admitting: Podiatry

## 2022-06-15 NOTE — Progress Notes (Addendum)
Carelink Summary Report / Loop Recorder 

## 2022-06-18 ENCOUNTER — Encounter: Payer: Self-pay | Admitting: Podiatry

## 2022-06-18 ENCOUNTER — Ambulatory Visit: Payer: 59 | Admitting: Podiatry

## 2022-06-18 DIAGNOSIS — M722 Plantar fascial fibromatosis: Secondary | ICD-10-CM

## 2022-06-19 NOTE — Progress Notes (Signed)
Subjective:   Patient ID: Ian Golden, male   DOB: 57 y.o.   MRN: [id number]   HPI Patient states his orthotics have been very beneficial he gets occasional low-grade inflammation of the heels and the orthotics have worn out and are no longer providing support   ROS      Objective:  Physical Exam  Neurovascular status intact with discomfort in the plantar fascia of both feet with inflammation fluid buildup noted of a mild nature with orthotics which have lost the top covers and are not quite as supportive     Assessment:  Fasciitis which occurs with orthotics to control it well     Plan:  H&P reviewed recommended new orthotics and patient is casted for functional orthotic devices and when those are returned and feel good he will return the older pair for recovering to provide for 2 pairs for the long-term.  This was accomplished today

## 2022-06-20 NOTE — Telephone Encounter (Signed)
LVM and sent mychart msg asking pt's wife to call back to schedule an appointment with either Amy or Dr. Pearlean Brownie

## 2022-06-27 ENCOUNTER — Telehealth: Payer: Self-pay | Admitting: Cardiology

## 2022-06-27 NOTE — Telephone Encounter (Signed)
Returned call to Pt.  Per Pt his phone died, his loop was monitored by an app on his phone.  He is wanting to change to a bedside monitor.  Advised would follow up.  Call CELL at [phone]

## 2022-06-27 NOTE — Telephone Encounter (Signed)
New Message:     Please call have questions about his Loop recorder.       1. Has your device fired? no  2. Is you device beeping? no  3. Are you experiencing draining or swelling at device site?   4. Are you calling to see if we received your device transmission?   5. Have you passed out?  no    Please route to Device Clinic Pool

## 2022-06-27 NOTE — Telephone Encounter (Signed)
Returned call to Pt.  Advised ok to switch to bedside monitor.  We have monitor available for pick up.  Wife will stop by office tomorrow to pick up and ask for [facility].

## 2022-07-05 ENCOUNTER — Encounter: Payer: Self-pay | Admitting: Family Medicine

## 2022-07-12 LAB — CUP PACEART REMOTE DEVICE CHECK
Date Time Interrogation Session: 20240522231203
Implantable Pulse Generator Implant Date: 20230404

## 2022-07-17 ENCOUNTER — Ambulatory Visit (INDEPENDENT_AMBULATORY_CARE_PROVIDER_SITE_OTHER): Payer: 59

## 2022-07-17 DIAGNOSIS — I639 Cerebral infarction, unspecified: Secondary | ICD-10-CM

## 2022-07-17 NOTE — Progress Notes (Signed)
[facility] Summary Report / Loop Recorder 

## 2022-07-24 ENCOUNTER — Encounter: Payer: Self-pay | Admitting: Family Medicine

## 2022-07-25 MED ORDER — SUMATRIPTAN SUCCINATE 6 MG/0.5ML ~~LOC~~ SOSY: 6.0000 mg | PREFILLED_SYRINGE | SUBCUTANEOUS | 5 refills | Status: DC | PRN

## 2022-08-09 NOTE — Progress Notes (Signed)
[facility] Summary Report / Loop Recorder 

## 2022-08-13 ENCOUNTER — Ambulatory Visit (INDEPENDENT_AMBULATORY_CARE_PROVIDER_SITE_OTHER): Payer: 59 | Admitting: Podiatrist

## 2022-08-13 ENCOUNTER — Ambulatory Visit (INDEPENDENT_AMBULATORY_CARE_PROVIDER_SITE_OTHER): Payer: 59

## 2022-08-13 DIAGNOSIS — M722 Plantar fascial fibromatosis: Secondary | ICD-10-CM

## 2022-08-13 DIAGNOSIS — I639 Cerebral infarction, unspecified: Secondary | ICD-10-CM

## 2022-08-13 NOTE — Progress Notes (Signed)
Patient presents today to pick up orthotics. They are noted to contour the arch and foot nicely and fit well in the shoes.  Break in period recommended and instructions for wear given.     Old orthotics will be sent off to be refurbished today as well. We will call when they are ready for pickup.

## 2022-08-14 LAB — CUP PACEART REMOTE DEVICE CHECK
Date Time Interrogation Session: 20240624231134
Implantable Pulse Generator Implant Date: 20230404

## 2022-08-17 DIAGNOSIS — I639 Cerebral infarction, unspecified: Secondary | ICD-10-CM | POA: Diagnosis not present

## 2022-08-22 ENCOUNTER — Other Ambulatory Visit: Payer: Self-pay | Admitting: Neurology

## 2022-08-22 DIAGNOSIS — R413 Other amnesia: Secondary | ICD-10-CM

## 2022-08-27 ENCOUNTER — Ambulatory Visit: Payer: 59 | Attending: Neurology | Admitting: Occupational Therapy

## 2022-08-27 DIAGNOSIS — M6281 Muscle weakness (generalized): Secondary | ICD-10-CM | POA: Diagnosis present

## 2022-08-27 DIAGNOSIS — H539 Unspecified visual disturbance: Secondary | ICD-10-CM

## 2022-08-27 DIAGNOSIS — R41842 Visuospatial deficit: Secondary | ICD-10-CM

## 2022-08-27 DIAGNOSIS — R4189 Other symptoms and signs involving cognitive functions and awareness: Secondary | ICD-10-CM | POA: Diagnosis present

## 2022-08-27 NOTE — Therapy (Cosign Needed Addendum)
OUTPATIENT OCCUPATIONAL THERAPY NEURO EVALUATION  Patient Name: Ian Golden MRN: [id number] DOB:28-Apr-1965, 57 y.o., male Today's Date: 08/27/2022  PCP: Hannah Beat, MD REFERRING PROVIDER: Morene Crocker, MD  END OF SESSION:  OT End of Session - 08/27/22 1150     Visit Number 1    Number of Visits 12    Date for OT Re-Evaluation 11/19/22    OT Start Time 0930    OT Stop Time 1015    OT Time Calculation (min) 45 min    Activity Tolerance Patient tolerated treatment well    Behavior During Therapy [facility] for tasks assessed/performed             Past Medical History:  Diagnosis Date   Allergic rhinitis, cause unspecified    Hemorrhoids    Migraine    Ulcer    Past Surgical History:  Procedure Laterality Date   BUBBLE STUDY  05/23/2021   Procedure: BUBBLE STUDY;  Surgeon: Wendall Stade, MD;  Location: [facility] ENDOSCOPY;  Service: Cardiovascular;;   LOOP RECORDER INSERTION N/A 05/23/2021   Procedure: LOOP RECORDER INSERTION;  Surgeon: Regan Lemming, MD;  Location: [facility] INVASIVE CV LAB;  Service: Cardiovascular;  Laterality: N/A;   TEE WITHOUT CARDIOVERSION N/A 05/23/2021   Procedure: TRANSESOPHAGEAL ECHOCARDIOGRAM (TEE);  Surgeon: Wendall Stade, MD;  Location: [facility] ENDOSCOPY;  Service: Cardiovascular;  Laterality: N/A;   WISDOM TOOTH EXTRACTION     with sedation   Patient Active Problem List   Diagnosis Date Noted   Acute CVA (cerebrovascular accident) (HCC) 05/21/2021   Hyperlipidemia 05/21/2021   Hemorrhoids 05/19/2013   MIGRAINE WITHOUT AURA 05/03/2010   ALLERGIC RHINITIS 05/26/2009   GASTRIC ULCER, HX OF 05/26/2009    ONSET DATE: 05/21/2021  REFERRING DIAG: CVA  THERAPY DIAG:  Impairment of visual perception  Visual-spatial impairment  Impaired cognition  Rationale for Evaluation and Treatment: Rehabilitation  SUBJECTIVE:   SUBJECTIVE STATEMENT: Pt. Reports that he is able to complete ADL/IADL tasks independent however was referred to OT for  visual spatial awareness and perception. Pt accompanied by: significant other  PERTINENT HISTORY: Pt. Is a 57 y.o. male who has been diagnosed with an acute CVA in 05/2021. Other dx include hyperlipidemia, migraines, FMD. Pt. Presents with mild cognitive impairments including ongoing memory changes including occasional difficulty recalling names and faces, and recognizing familiar locations due to vascular changes from the past stroke.  PRECAUTIONS: None  WEIGHT BEARING RESTRICTIONS: No  PAIN:  Are you having pain? No  FALLS: Has patient fallen in last 6 months? No  LIVING ENVIRONMENT: Lives with: lives with their family Lives in: House/apartment Stairs: Yes: Internal: 15 steps; on right going up and External: 3 steps; on left going up into the front door and 3 steps with rail on right in garage Has following equipment at home: None  PLOF: Independent  PATIENT GOALS: Desires to focus on improving memory and processing information, recall of information, spatial awareness, depth perception  OBJECTIVE:   HAND DOMINANCE: Right  ADLs:  Eating: Independent Grooming: Independent UB Dressing:Independent LB Dressing: Independent Toileting: Independent Bathing: Independent Tub Shower transfers: Independent Equipment:  None  IADLs: Shopping: Spouse and Pt. Completes grocery shopping, Pt. Uses a list to ensure he remember to get all the items Light housekeeping: Spouse cleans the house Meal Prep: Independent Community mobility: Independent Medication management: Uses pill organizer, and remembers to take pills at correct time Financial management: Spouse has always paid the bills, Pt. State if he had to he  would be able to Handwriting: 100% legible  MOBILITY STATUS: Independent  POSTURE COMMENTS:  No Significant postural limitations Sitting balance: WFL  ACTIVITY TOLERANCE: Activity tolerance: WFL  FUNCTIONAL OUTCOME MEASURES: FOTO: 55 TR: 60  UPPER EXTREMITY ROM:     Active ROM Right WFL Left WFL  Shoulder flexion    Shoulder abduction    Shoulder adduction    Shoulder extension    Shoulder internal rotation    Shoulder external rotation    Elbow flexion    Elbow extension    Wrist flexion    Wrist extension    Wrist ulnar deviation    Wrist radial deviation    Wrist pronation    Wrist supination    (Blank rows = not tested)  UPPER EXTREMITY MMT:     MMT Right Left   Shoulder flexion 5/5 5/5  Shoulder abduction 5/5 5/5  Shoulder adduction    Shoulder extension    Shoulder internal rotation    Shoulder external rotation    Middle trapezius    Lower trapezius    Elbow flexion 5/5 5/5  Elbow extension 5/5 5/5  Wrist flexion 5/5 5/5  Wrist extension 5/5 5/5  Wrist ulnar deviation    Wrist radial deviation    Wrist pronation    Wrist supination    (Blank rows = not tested)  HAND FUNCTION: Grip strength: Right: 83 lbs; Left: 83 lbs, Lateral pinch: Right: 21 lbs, Left: 19 lbs, and 3 point pinch: Right: 16 lbs, Left: 22 lbs  COORDINATION: 9 Hole Peg test: Right: 22 sec; Left: 23 sec  SENSATION: Light touch: WFL Stereognosis: WFL  EDEMA: None  MUSCLE TONE: WFL  COGNITION: Overall cognitive status: Impaired  VISION: Subjective report: Pt. Reports when driving he is having a tough time with depth perception to determine distance between the cars in front of him. Baseline vision: Wears glasses for reading only Visual history:  None  VISION ASSESSMENT: To be further assessed in functional context  Patient has difficulty with following activities due to following visual impairments: Driving  PERCEPTION: Impaired: Spatial orientation: To be further assessed  PRAXIS: WFL  TODAY'S TREATMENT:                                                                                                                              DATE: 08/27/2022  Measurements were obtained and goals were established   PATIENT EDUCATION: Education  details: OT Service, POC, Goals, Driver rehabilitation services Person educated: Patient and Spouse Education method: Explanation, Demonstration, and Verbal cues Education comprehension: verbalized understanding  HOME EXERCISE PROGRAM:    GOALS: Goals reviewed with patient? Yes  SHORT TERM GOALS: Target date: 10/08/2022  Pt. Will improve FOTO score by 2 points to reflect improved pt. perceived functional performance in assessment specific ADL/IADL tasks.  Baseline: Eval: FOTO: 55 TR: 60 Goal status: INITIAL   LONG TERM GOALS: Target date: 11/19/2022  Pt. Will identify/demonstrate cognitive  compensatory  strategies for memory to increase engagement in ADL/IADL tasks. Baseline: Eval: Pt. Currently does not utilize, education to be provided Goal status: INITIAL  2.  Pt. Will identify/demonstrate visual compensatory strategies  for visual perception to increase engagement in ADL/IADL tasks. Baseline: Pt. Currently does not utilize, education to be provided Goal status: INITIAL   ASSESSMENT:  CLINICAL IMPRESSION:  Patient is a 56 y.o. male who was seen today for occupational therapy evaluation for CVA. Pt./caregiver report changes with memory, spatial awareness/ depth perception which are hindering his ability to actively engage in daily ADL/IADL tasks activities at his PLOF. Pt. FOTO score is 55. Pt. would benefit from skilled OT services assess visual perception, and provide education about visual compensatory strategies, and cognitive compensatory strategies for memory as needed to enhance engagement in, and to improve independence in ADL/IADL tasks.   PERFORMANCE DEFICITS: in functional skills including ADLs and IADLs, cognitive skills including memory, perception, problem solving, and thought, and psychosocial skills including coping strategies, environmental adaptation, and routines and behaviors.   IMPAIRMENTS: are limiting patient from ADLs and IADLs.   CO-MORBIDITIES: may  have co-morbidities  that affects occupational performance. Patient will benefit from skilled OT to address above impairments and improve overall function.  MODIFICATION OR ASSISTANCE TO COMPLETE EVALUATION: No modification of tasks or assist necessary to complete an evaluation.  OT OCCUPATIONAL PROFILE AND HISTORY: Detailed assessment: Review of records and additional review of physical, cognitive, psychosocial history related to current functional performance.  CLINICAL DECISION MAKING: Moderate - several treatment options, min-mod task modification necessary  REHAB POTENTIAL: Good  EVALUATION COMPLEXITY: Moderate    PLAN:  OT FREQUENCY: 1x/week  OT DURATION: 12 weeks  PLANNED INTERVENTIONS: self care/ADL training, therapeutic activity, patient/family education, cognitive remediation/compensation, and visual/perceptual remediation/compensation  RECOMMENDED OTHER SERVICES: ST  CONSULTED AND AGREED WITH PLAN OF CARE: Patient and family member/caregiver  PLAN FOR NEXT SESSION: Initiate treatment and complete visual assessment   Herma Carson, Student-OT 08/27/2022, 12:10 PM  This entire session was performed under the direct supervision and direction of a licensed therapist. I have personally read, edited, and approve of the note as written.   Olegario Messier, MS, OTR/L   08/27/2022

## 2022-08-28 ENCOUNTER — Other Ambulatory Visit: Payer: Self-pay | Admitting: Family Medicine

## 2022-08-29 ENCOUNTER — Ambulatory Visit: Payer: 59 | Admitting: Occupational Therapy

## 2022-08-29 DIAGNOSIS — M6281 Muscle weakness (generalized): Secondary | ICD-10-CM

## 2022-08-29 DIAGNOSIS — H539 Unspecified visual disturbance: Secondary | ICD-10-CM | POA: Diagnosis not present

## 2022-08-29 NOTE — Therapy (Signed)
OUTPATIENT OCCUPATIONAL THERAPY NEURO TREATMENT/DISCHARGE  Patient Name: Ian Golden MRN: [id number] DOB:September 13, 1965, 57 y.o., male Today's Date: 08/29/2022  PCP: Hannah Beat, MD REFERRING PROVIDER: Morene Crocker, MD  END OF SESSION:  OT End of Session - 08/29/22 0933     Visit Number 2    Number of Visits 12    Date for OT Re-Evaluation 11/19/22    OT Start Time 0800    OT Stop Time 0845    OT Time Calculation (min) 45 min    Activity Tolerance Patient tolerated treatment well    Behavior During Therapy [facility] for tasks assessed/performed             Past Medical History:  Diagnosis Date   Allergic rhinitis, cause unspecified    Hemorrhoids    Migraine    Ulcer    Past Surgical History:  Procedure Laterality Date   BUBBLE STUDY  05/23/2021   Procedure: BUBBLE STUDY;  Surgeon: Wendall Stade, MD;  Location: [facility] ENDOSCOPY;  Service: Cardiovascular;;   LOOP RECORDER INSERTION N/A 05/23/2021   Procedure: LOOP RECORDER INSERTION;  Surgeon: Regan Lemming, MD;  Location: [facility] INVASIVE CV LAB;  Service: Cardiovascular;  Laterality: N/A;   TEE WITHOUT CARDIOVERSION N/A 05/23/2021   Procedure: TRANSESOPHAGEAL ECHOCARDIOGRAM (TEE);  Surgeon: Wendall Stade, MD;  Location: [facility] ENDOSCOPY;  Service: Cardiovascular;  Laterality: N/A;   WISDOM TOOTH EXTRACTION     with sedation   Patient Active Problem List   Diagnosis Date Noted   Acute CVA (cerebrovascular accident) (HCC) 05/21/2021   Hyperlipidemia 05/21/2021   Hemorrhoids 05/19/2013   MIGRAINE WITHOUT AURA 05/03/2010   ALLERGIC RHINITIS 05/26/2009   GASTRIC ULCER, HX OF 05/26/2009    ONSET DATE: 05/21/2021  REFERRING DIAG: CVA  THERAPY DIAG:  Muscle weakness (generalized)  Rationale for Evaluation and Treatment: Rehabilitation  SUBJECTIVE:   SUBJECTIVE STATEMENT: Pt. Reports that he drove himself to the OT appointment this morning without incident. Pt. Reports that his wife is concerned that he gets too  close to cars. Pt. reports that he is focusing on increasing his awareness of this.  Pt accompanied by: significant other  PERTINENT HISTORY: Pt. Is a 57 y.o. male who has been diagnosed with an acute CVA in 05/2021. Other dx include hyperlipidemia, migraines, FMD. Pt. Presents with mild cognitive impairments including ongoing memory changes including occasional difficulty recalling names and faces, and recognizing familiar locations due to vascular changes from the past stroke.  PRECAUTIONS: None  WEIGHT BEARING RESTRICTIONS: No  PAIN:  Are you having pain? No  FALLS: Has patient fallen in last 6 months? No  LIVING ENVIRONMENT: Lives with: lives with their family Lives in: House/apartment Stairs: Yes: Internal: 15 steps; on right going up and External: 3 steps; on left going up into the front door and 3 steps with rail on right in garage Has following equipment at home: None  PLOF: Independent  PATIENT GOALS: Desires to focus on improving memory and processing information, recall of information, spatial awareness, depth perception  OBJECTIVE:   HAND DOMINANCE: Right  ADLs:  Eating: Independent Grooming: Independent UB Dressing:Independent LB Dressing: Independent Toileting: Independent Bathing: Independent Tub Shower transfers: Independent Equipment:  None  IADLs: Shopping: Spouse and Pt. Completes grocery shopping, Pt. Uses a list to ensure he remember to get all the items Light housekeeping: Spouse cleans the house Meal Prep: Independent Community mobility: Independent Medication management: Uses pill organizer, and remembers to take pills at correct time Financial management: Spouse  has always paid the bills, Pt. State if he had to he would be able to Handwriting: 100% legible  MOBILITY STATUS: Independent  POSTURE COMMENTS:  No Significant postural limitations Sitting balance: WFL  ACTIVITY TOLERANCE: Activity tolerance: WFL  FUNCTIONAL OUTCOME  MEASURES: FOTO: 55 TR: 60  UPPER EXTREMITY ROM:    Active ROM Right WFL Left WFL  Shoulder flexion    Shoulder abduction    Shoulder adduction    Shoulder extension    Shoulder internal rotation    Shoulder external rotation    Elbow flexion    Elbow extension    Wrist flexion    Wrist extension    Wrist ulnar deviation    Wrist radial deviation    Wrist pronation    Wrist supination    (Blank rows = not tested)  UPPER EXTREMITY MMT:     MMT Right Left   Shoulder flexion 5/5 5/5  Shoulder abduction 5/5 5/5  Shoulder adduction    Shoulder extension    Shoulder internal rotation    Shoulder external rotation    Middle trapezius    Lower trapezius    Elbow flexion 5/5 5/5  Elbow extension 5/5 5/5  Wrist flexion 5/5 5/5  Wrist extension 5/5 5/5  Wrist ulnar deviation    Wrist radial deviation    Wrist pronation    Wrist supination    (Blank rows = not tested)  HAND FUNCTION: Grip strength: Right: 83 lbs; Left: 83 lbs, Lateral pinch: Right: 21 lbs, Left: 19 lbs, and 3 point pinch: Right: 16 lbs, Left: 22 lbs  COORDINATION: 9 Hole Peg test: Right: 22 sec; Left: 23 sec  SENSATION: Light touch: WFL Stereognosis: WFL  EDEMA: None  MUSCLE TONE: WFL  COGNITION: Overall cognitive status: Impaired  VISION: Subjective report: Pt. Reports when driving he is having a tough time with depth perception to determine distance between the cars in front of him. Baseline vision: Wears glasses for reading only Visual history:  None  VISION ASSESSMENT: To be further assessed in functional context  Patient has difficulty with following activities due to following visual impairments: Driving  PERCEPTION: Impaired: Spatial orientation: To be further assessed  PRAXIS: WFL  TODAY'S TREATMENT:                                                                                                                              DATE: 08/29/2022  Pt. was administered the MVPT to  assess visual perceptual functioning in the areas on visual discrimination focused on changes in the image, visual memory, visual closure, as well as visual discrimination of similar, and different images. Pt. Was able to accurately identify 35 of 36 items efficiently. With 1 item misidentification in the visual closure subgroup.  Pt. was able to complete a visual scan course while walking through the hallway. Pt. was able to visually scan 32 out of 33 items positioned at various heights to the right, and  left for each trial. Pt. was able to scan ahead for the items while maintaining a steady walking pace, as well as when challenged by increasing speed.  Pt. did slow his gait 1/2 way through then resumed pace during each trial.   Pt. worked on the accuracy of reaching for items, and placing them over targets positioned at different heights, and distances. Pt. was able to accurately reach for, and place the items on on each of the targets various targets.   Pt. worked on Journalist, newspaper patterns. Pt. was able to complete complex design patterns, however required multiple cues, and increased time for turning and flipping the triangle shaped design pieces to fit the design pattern.  PATIENT EDUCATION: Education details: OT Service, POC, Goals, Driver rehabilitation services Person educated: Patient and Spouse Education method: Explanation, Demonstration, and Verbal cues Education comprehension: verbalized understanding  HOME EXERCISE PROGRAM:    GOALS: Goals reviewed with patient? Yes  SHORT TERM GOALS: Target date: 10/08/2022  Pt. Will improve FOTO score by 2 points to reflect improved pt. perceived functional performance in assessment specific ADL/IADL tasks.  Baseline: Eval: FOTO: 55 TR: 60 Goal status: INITIAL   LONG TERM GOALS: Target date: 11/19/2022  Pt. Will identify/demonstrate cognitive  compensatory strategies for memory to increase engagement in ADL/IADL  tasks. Baseline: 08/29/2022: Reviewed cognitive compensatory strategies. Eval: Pt. Currently does not utilize, education to be provided Goal status: INITIAL  2.  Pt. Will identify/demonstrate visual compensatory strategies  for visual perception to increase engagement in ADL/IADL tasks. Baseline: 08/29/2022:  Reviewed compensatory strategies for visual perceptual skills. Eval: Pt. Currently does not utilize, education to be provided Goal status: INITIAL   ASSESSMENT:  CLINICAL IMPRESSION:  Pt. was able to accurately identify 35 of 36 items on the MVPT efficiently, with 1 misidentification in the visual closure subgroup. Pt. was able to visually scan 32 out of 33 items positioned at various heights to the right, and left on the visual scan course. Pt. slowed pace 1/2 way through both trials. Pt. was able to accurately reach for, and place the items on each of the targets place at various angles near, far, high, and low. Pt. was able to complete complex design patterns, however required multiple cues, and increased time for turning and flipping the triangle shaped design pieces to fit the design pattern. Pt. Education was provided about activities that he can participate in at home, compensatory strategies for visual spatial skills, as well as strategies for memory. Pt. Is being referred to ST for further assessment of cognition, and memory. Pt. Is now appropriate for discharge from OT services at this time.    PERFORMANCE DEFICITS: in functional skills including ADLs and IADLs, cognitive skills including memory, perception, problem solving, and thought, and psychosocial skills including coping strategies, environmental adaptation, and routines and behaviors.   IMPAIRMENTS: are limiting patient from ADLs and IADLs.   CO-MORBIDITIES: may have co-morbidities  that affects occupational performance. Patient will benefit from skilled OT to address above impairments and improve overall  function.  MODIFICATION OR ASSISTANCE TO COMPLETE EVALUATION: No modification of tasks or assist necessary to complete an evaluation.  OT OCCUPATIONAL PROFILE AND HISTORY: Detailed assessment: Review of records and additional review of physical, cognitive, psychosocial history related to current functional performance.  CLINICAL DECISION MAKING: Moderate - several treatment options, min-mod task modification necessary  REHAB POTENTIAL: Good  EVALUATION COMPLEXITY: Moderate    PLAN:  OT FREQUENCY: 1x/week  OT DURATION: 12 weeks  PLANNED  INTERVENTIONS: self care/ADL training, therapeutic activity, patient/family education, cognitive remediation/compensation, and visual/perceptual remediation/compensation  RECOMMENDED OTHER SERVICES: ST  CONSULTED AND AGREED WITH PLAN OF CARE: Patient and family member/caregiver  PLAN FOR NEXT SESSION: Initiate treatment and complete visual assessment   Herma Carson, OTS 08/29/2022, 9:55 AM  This entire session was performed under the direct supervision and direction of a licensed therapist. I have personally read, edited, and approve of the note as written.   Olegario Messier, MS, OTR/L   08/29/2022

## 2022-08-31 NOTE — Progress Notes (Signed)
[facility] Summary Report / Loop Recorder 

## 2022-09-03 ENCOUNTER — Ambulatory Visit: Payer: 59 | Admitting: Occupational Therapy

## 2022-09-05 ENCOUNTER — Ambulatory Visit: Payer: 59 | Admitting: Occupational Therapy

## 2022-09-10 ENCOUNTER — Ambulatory Visit: Payer: 59 | Admitting: Occupational Therapy

## 2022-09-12 ENCOUNTER — Ambulatory Visit: Payer: 59 | Admitting: Occupational Therapy

## 2022-09-13 ENCOUNTER — Ambulatory Visit: Payer: 59

## 2022-09-13 DIAGNOSIS — I639 Cerebral infarction, unspecified: Secondary | ICD-10-CM

## 2022-09-16 LAB — CUP PACEART REMOTE DEVICE CHECK
Date Time Interrogation Session: 20240727230759
Implantable Pulse Generator Implant Date: 20230404

## 2022-09-17 ENCOUNTER — Ambulatory Visit: Payer: 59 | Admitting: Occupational Therapy

## 2022-09-18 ENCOUNTER — Ambulatory Visit
Admission: RE | Admit: 2022-09-18 | Discharge: 2022-09-18 | Disposition: A | Discharge status: Home / Self Care | Payer: 59 | Source: Ambulatory Visit | Attending: Neurology | Admitting: Neurology

## 2022-09-18 DIAGNOSIS — R413 Other amnesia: Secondary | ICD-10-CM

## 2022-09-18 DIAGNOSIS — I639 Cerebral infarction, unspecified: Secondary | ICD-10-CM

## 2022-09-19 ENCOUNTER — Encounter: Payer: 59 | Admitting: Occupational Therapy

## 2022-09-24 ENCOUNTER — Encounter: Payer: 59 | Admitting: Occupational Therapy

## 2022-09-25 NOTE — Progress Notes (Signed)
[facility] Summary Report / Loop Recorder 

## 2022-09-27 ENCOUNTER — Encounter: Payer: 59 | Admitting: Occupational Therapy

## 2022-10-01 ENCOUNTER — Encounter: Payer: 59 | Admitting: Occupational Therapy

## 2022-10-04 ENCOUNTER — Encounter: Payer: 59 | Admitting: Occupational Therapy

## 2022-10-09 ENCOUNTER — Encounter: Payer: 59 | Admitting: Occupational Therapy

## 2022-10-15 ENCOUNTER — Ambulatory Visit: Payer: 59

## 2022-10-15 DIAGNOSIS — I639 Cerebral infarction, unspecified: Secondary | ICD-10-CM

## 2022-10-17 LAB — CUP PACEART REMOTE DEVICE CHECK
Date Time Interrogation Session: 20240827231109
Implantable Pulse Generator Implant Date: 20230404

## 2022-10-19 DIAGNOSIS — I639 Cerebral infarction, unspecified: Secondary | ICD-10-CM

## 2022-10-24 NOTE — Progress Notes (Signed)
[facility] Summary Report / Loop Recorder 

## 2022-11-15 ENCOUNTER — Ambulatory Visit (INDEPENDENT_AMBULATORY_CARE_PROVIDER_SITE_OTHER): Payer: 59

## 2022-11-15 DIAGNOSIS — I639 Cerebral infarction, unspecified: Secondary | ICD-10-CM

## 2022-11-19 DIAGNOSIS — I639 Cerebral infarction, unspecified: Secondary | ICD-10-CM | POA: Diagnosis not present

## 2022-11-20 LAB — CUP PACEART REMOTE DEVICE CHECK
Date Time Interrogation Session: 20240929231250
Implantable Pulse Generator Implant Date: 20230404

## 2022-11-27 NOTE — Progress Notes (Signed)
[facility] Summary Report / Loop Recorder 

## 2022-12-17 ENCOUNTER — Ambulatory Visit: Payer: 59

## 2022-12-17 DIAGNOSIS — I639 Cerebral infarction, unspecified: Secondary | ICD-10-CM

## 2022-12-18 LAB — CUP PACEART REMOTE DEVICE CHECK
Date Time Interrogation Session: 20241027231334
Implantable Pulse Generator Implant Date: 20230404

## 2022-12-24 DIAGNOSIS — I639 Cerebral infarction, unspecified: Secondary | ICD-10-CM

## 2023-01-03 NOTE — Progress Notes (Signed)
[facility] Summary Report / Loop Recorder 

## 2023-01-18 ENCOUNTER — Ambulatory Visit (INDEPENDENT_AMBULATORY_CARE_PROVIDER_SITE_OTHER): Payer: 59

## 2023-01-18 DIAGNOSIS — I639 Cerebral infarction, unspecified: Secondary | ICD-10-CM

## 2023-01-20 LAB — CUP PACEART REMOTE DEVICE CHECK
Date Time Interrogation Session: 20241129230527
Implantable Pulse Generator Implant Date: 20230404

## 2023-01-28 DIAGNOSIS — I639 Cerebral infarction, unspecified: Secondary | ICD-10-CM

## 2023-02-11 NOTE — Addendum Note (Signed)
Addended by: Elease Etienne A on: 02/11/2023 08:38 AM   Modules accepted: Orders

## 2023-02-11 NOTE — Progress Notes (Signed)
[facility] Summary Report / Loop Recorder 

## 2023-02-18 ENCOUNTER — Ambulatory Visit: Payer: 59

## 2023-02-18 DIAGNOSIS — I639 Cerebral infarction, unspecified: Secondary | ICD-10-CM

## 2023-02-19 LAB — CUP PACEART REMOTE DEVICE CHECK
Date Time Interrogation Session: 20241229231059
Implantable Pulse Generator Implant Date: 20230404

## 2023-03-04 DIAGNOSIS — I639 Cerebral infarction, unspecified: Secondary | ICD-10-CM | POA: Diagnosis not present

## 2023-03-21 ENCOUNTER — Ambulatory Visit: Payer: 59

## 2023-03-21 DIAGNOSIS — I639 Cerebral infarction, unspecified: Secondary | ICD-10-CM

## 2023-03-21 LAB — CUP PACEART REMOTE DEVICE CHECK
Date Time Interrogation Session: 20250129230933
Implantable Pulse Generator Implant Date: 20230404

## 2023-04-04 DIAGNOSIS — I639 Cerebral infarction, unspecified: Secondary | ICD-10-CM | POA: Diagnosis not present

## 2023-04-22 ENCOUNTER — Ambulatory Visit (INDEPENDENT_AMBULATORY_CARE_PROVIDER_SITE_OTHER): Payer: 59

## 2023-04-22 DIAGNOSIS — I639 Cerebral infarction, unspecified: Secondary | ICD-10-CM

## 2023-04-23 ENCOUNTER — Encounter (HOSPITAL_COMMUNITY): Payer: Self-pay

## 2023-04-23 ENCOUNTER — Other Ambulatory Visit: Payer: Self-pay

## 2023-04-23 ENCOUNTER — Observation Stay (HOSPITAL_COMMUNITY)
Admission: EM | Admit: 2023-04-23 | Discharge: 2023-04-25 | Disposition: A | Discharge status: Home / Self Care | Attending: Internal Medicine | Admitting: Internal Medicine

## 2023-04-23 DIAGNOSIS — R1013 Epigastric pain: Secondary | ICD-10-CM | POA: Insufficient documentation

## 2023-04-23 DIAGNOSIS — Z8673 Personal history of transient ischemic attack (TIA), and cerebral infarction without residual deficits: Secondary | ICD-10-CM | POA: Diagnosis not present

## 2023-04-23 DIAGNOSIS — G43909 Migraine, unspecified, not intractable, without status migrainosus: Secondary | ICD-10-CM | POA: Diagnosis not present

## 2023-04-23 DIAGNOSIS — Z7982 Long term (current) use of aspirin: Secondary | ICD-10-CM | POA: Diagnosis not present

## 2023-04-23 DIAGNOSIS — R55 Syncope and collapse: Secondary | ICD-10-CM | POA: Diagnosis not present

## 2023-04-23 DIAGNOSIS — Z79899 Other long term (current) drug therapy: Secondary | ICD-10-CM | POA: Insufficient documentation

## 2023-04-23 DIAGNOSIS — Z1152 Encounter for screening for COVID-19: Secondary | ICD-10-CM | POA: Insufficient documentation

## 2023-04-23 DIAGNOSIS — R110 Nausea: Secondary | ICD-10-CM | POA: Diagnosis not present

## 2023-04-23 DIAGNOSIS — E785 Hyperlipidemia, unspecified: Secondary | ICD-10-CM | POA: Diagnosis present

## 2023-04-23 LAB — URINALYSIS, ROUTINE W REFLEX MICROSCOPIC
Bilirubin Urine: NEGATIVE
Glucose, UA: NEGATIVE mg/dL
Hgb urine dipstick: NEGATIVE
Ketones, ur: NEGATIVE mg/dL
Leukocytes,Ua: NEGATIVE
Nitrite: NEGATIVE
Protein, ur: NEGATIVE mg/dL
Specific Gravity, Urine: 1.027 (ref 1.005–1.030)
pH: 5 (ref 5.0–8.0)

## 2023-04-23 LAB — CBG MONITORING, ED: Glucose-Capillary: 84 mg/dL (ref 70–99)

## 2023-04-23 LAB — HEPATIC FUNCTION PANEL
ALT: 29 U/L (ref 0–44)
AST: 27 U/L (ref 15–41)
Albumin: 3.9 g/dL (ref 3.5–5.0)
Alkaline Phosphatase: 85 U/L (ref 38–126)
Bilirubin, Direct: 0.1 mg/dL (ref 0.0–0.2)
Indirect Bilirubin: 0.6 mg/dL (ref 0.3–0.9)
Total Bilirubin: 0.7 mg/dL (ref 0.0–1.2)
Total Protein: 7 g/dL (ref 6.5–8.1)

## 2023-04-23 LAB — CBC
HCT: 47.4 % (ref 39.0–52.0)
Hemoglobin: 16.3 g/dL (ref 13.0–17.0)
MCH: 30.4 pg (ref 26.0–34.0)
MCHC: 34.4 g/dL (ref 30.0–36.0)
MCV: 88.4 fL (ref 80.0–100.0)
Platelets: 168 10*3/uL (ref 150–400)
RBC: 5.36 MIL/uL (ref 4.22–5.81)
RDW: 12.6 % (ref 11.5–15.5)
WBC: 9.9 10*3/uL (ref 4.0–10.5)
nRBC: 0 % (ref 0.0–0.2)

## 2023-04-23 LAB — D-DIMER, QUANTITATIVE: D-Dimer, Quant: 0.3 ug{FEU}/mL (ref 0.00–0.50)

## 2023-04-23 LAB — BASIC METABOLIC PANEL
Anion gap: 10 (ref 5–15)
BUN: 28 mg/dL — ABNORMAL HIGH (ref 6–20)
CO2: 24 mmol/L (ref 22–32)
Calcium: 9.1 mg/dL (ref 8.9–10.3)
Chloride: 105 mmol/L (ref 98–111)
Creatinine, Ser: 1.09 mg/dL (ref 0.61–1.24)
GFR, Estimated: >60 mL/min (ref >60)
Glucose, Bld: 103 mg/dL — ABNORMAL HIGH (ref 70–99)
Potassium: 4.4 mmol/L (ref 3.5–5.1)
Sodium: 139 mmol/L (ref 135–145)

## 2023-04-23 LAB — RESP PANEL BY RT-PCR (RSV, FLU A&B, COVID)  RVPGX2
Influenza A by PCR: NEGATIVE
Influenza B by PCR: NEGATIVE
Resp Syncytial Virus by PCR: NEGATIVE
SARS Coronavirus 2 by RT PCR: NEGATIVE

## 2023-04-23 LAB — LIPASE, BLOOD: Lipase: 35 U/L (ref 11–51)

## 2023-04-23 LAB — TROPONIN I (HIGH SENSITIVITY)
Troponin I (High Sensitivity): 8 ng/L (ref <18)
Troponin I (High Sensitivity): 10 ng/L (ref <18)

## 2023-04-23 MED ORDER — ONDANSETRON HCL 4 MG/2ML IJ SOLN
4.0000 mg | Freq: Once | INTRAMUSCULAR | Status: AC
  Administered 2023-04-23: 4 mg via INTRAVENOUS
  Filled 2023-04-23: qty 2

## 2023-04-23 MED ORDER — LACTATED RINGERS IV BOLUS
1000.0000 mL | Freq: Once | INTRAVENOUS | Status: AC
  Administered 2023-04-23: 1000 mL via INTRAVENOUS

## 2023-04-23 NOTE — ED Triage Notes (Addendum)
 Pt [place] ED via [facility] from church. Apparently was in kitchen at church, experiencing nausea all day and got overheated which prompted EMS call, on arrival pt had syncopal episode witnessed by EMS. Lasting aprox 2 seconds. Once pt awoke had emesis episode and then reported felt better. Pt denies CP/SHOB. No medications given by EMS.   #20 LAC  Last VS: CBG 103,  BP 138/82, hr 89. 98%RA, RR18,.

## 2023-04-23 NOTE — ED Provider Triage Note (Signed)
 Emergency Medicine Provider Triage Evaluation Note  Ian Golden , a 58 y.o. male  was evaluated in triage.  Pt complains of nausea and syncope.  The patient had 2 episodes of presyncope/syncope earlier this evening at church.  Denies any chest pain with this.  States that he felt very lightheaded and nauseated and had a near syncopal episode where he fell down to his knees.  He had a second episode that was witnessed by church members.  He reports some mild nausea but denies any chest pain  Review of Systems  Positive: See above Negative: Chest pain  Physical Exam  BP 128/82   Pulse (!) 103   Temp 98 F (36.7 C) (Oral)   Resp 17   Ht 6\' 1"  (1.854 m)   Wt 88.5 kg   SpO2 98%   BMI 25.73 kg/m  Gen:   Awake, no distress   Resp:  Normal effort  MSK:   Moves extremities without difficulty    Medical Decision Making  Medically screening exam initiated at 6:53 PM.  Appropriate orders placed.  Ian Golden was informed that the remainder of the evaluation will be completed by another provider, this initial triage assessment does not replace that evaluation, and the importance of remaining in the ED until their evaluation is complete.  Appropriate labs ordered to evaluate for atypical ACS.  With 2 episodes and EKG changes the patient is not appropriate for the lobby at this time.   Ian Glaze, MD 04/23/23 [phone]

## 2023-04-23 NOTE — ED Provider Notes (Signed)
 [facility] AT [facility] Provider Note   CSN: [id number] Arrival date & time: 04/23/23  1829     History  Chief Complaint  Patient presents with   Loss of Consciousness   Emesis    Ian Golden is a 58 y.o. male.  58 year old male with past medical history of CVA in the past and hyperlipidemia presenting to the emergency department today after 2 syncopal episodes.  The patient states that after lunch today he has been having some nausea and diaphoresis.  He denies any associated chest pain.  Denies any shortness of breath.  He states that he was feeling worse and had a near syncopal episode where he went down to his knees while he was at church this afternoon cooking.  He states that he was able to make it inside and he apparently had another episode and this time did lose consciousness.  He was apparently caught and did not have any injuries from this.  He was apparently unresponsive for 2 or 3 seconds.  There was no seizure activity.  He was brought to the ER at that time for further evaluation.  He reports he is still having some nausea.   Loss of Consciousness Associated symptoms: vomiting   Emesis      Home Medications Prior to Admission medications   Medication Sig Start Date End Date Taking? Authorizing Provider  Ascorbic Acid (VITAMIN C PO) Take 1 tablet by mouth daily.    [provider]  aspirin EC 81 MG tablet Take 1 tablet (81 mg total) by mouth daily. Swallow whole. 05/23/21   Osvaldo Shipper, MD  atorvastatin (LIPITOR) 80 MG tablet TAKE 1 TABLET (80 MG TOTAL) BY MOUTH DAILY. 08/28/22   Copland, Karleen Hampshire, MD  fluocinonide ointment (LIDEX) 0.05 % Apply topically 2 (two) times daily. 09/24/16   Copland, Karleen Hampshire, MD  fluticasone (FLONASE) 50 MCG/ACT nasal spray Place 1-2 sprays into both nostrils daily. Patient not taking: Reported on 08/27/2022 05/23/21   Osvaldo Shipper, MD  SUMAtriptan (IMITREX) 6 MG/0.5ML SOSY injection Inject 0.5 mLs (6  mg total) into the skin every 2 (two) hours as needed for migraine or headache. Max 2 doses/24 hours 07/25/22   Copland, Karleen Hampshire, MD      Allergies    Patient has no known allergies.    Review of Systems   Review of Systems  Cardiovascular:  Positive for syncope.  Gastrointestinal:  Positive for vomiting.  All other systems reviewed and are negative.   Physical Exam Updated Vital Signs BP 132/78   Pulse 88   Temp 99.7 F (37.6 C) (Oral)   Resp (!) 23   Ht 6\' 1"  (1.854 m)   Wt 88.5 kg   SpO2 95%   BMI 25.73 kg/m  Physical Exam Vitals and nursing note reviewed.   Gen: NAD Eyes: PERRL, EOMI HEENT: no oropharyngeal swelling Neck: trachea midline Resp: clear to auscultation bilaterally Card: RRR, no murmurs, rubs, or gallops Abd: nontender, nondistended Extremities: no calf tenderness, no edema Vascular: 2+ radial pulses bilaterally, 2+ DP pulses bilaterally Skin: no rashes Psyc: acting appropriately   ED Results / Procedures / Treatments   Labs (all labs ordered are listed, but only abnormal results are displayed) Labs Reviewed  BASIC METABOLIC PANEL - Abnormal; Notable for the following components:      Result Value   Glucose, Bld 103 (*)    BUN 28 (*)    All other components within normal limits  RESP PANEL  BY RT-PCR (RSV, FLU A&B, COVID)  [id number]  CBC  URINALYSIS, ROUTINE W REFLEX MICROSCOPIC  HEPATIC FUNCTION PANEL  LIPASE, BLOOD  D-DIMER, QUANTITATIVE  CBG MONITORING, ED  TROPONIN I (HIGH SENSITIVITY)  TROPONIN I (HIGH SENSITIVITY)    EKG EKG Interpretation Date/Time:  Tuesday April 23 2023 18:32:09 EST Ventricular Rate:  101 PR Interval:  168 QRS Duration:  96 QT Interval:  332 QTC Calculation: 430 R Axis:   51  Text Interpretation: Sinus tachycardia Possible Left atrial enlargement T wave abnormality, consider anterolateral ischemia Abnormal ECG When compared with ECG of 21-May-2021 10:18, PREVIOUS ECG IS PRESENT Confirmed by Beckey Downing [id number])  on 04/23/2023 6:53:44 PM  Radiology No results found.  Procedures Procedures    Medications Ordered in ED Medications  ondansetron (ZOFRAN) injection 4 mg (4 mg Intravenous Given 04/23/23 1925)  lactated ringers bolus 1,000 mL (1,000 mLs Intravenous New Bag/Given 04/23/23 1928)    ED Course/ Medical Decision Making/ A&P                                 Medical Decision Making 58 year old male with past medical history of CVA in the past presenting to the emergency department today with nausea and diaphoresis with 2 apparent syncopal episodes.  I will further evaluate patient here with basic labs Wels and EKG, chest x-ray, and troponin for further evaluation for ACS, pulmonary edema, pulmonary infiltrates, pneumothorax.  Also obtain LFTs and lipase to evaluate for hepatobiliary pathology or pancreatitis.  Patient was tachycardic here on his initial vital signs.  Given the syncope with this will obtain a D-dimer to evaluate for pulmonary embolism.  I will give patient Zofran for his nausea.  It is possible this is due to vasovagal syncope but with 2 episodes that is more concerning.  The patient's EKG interpreted by me does have some concerning findings in the lateral leads.  Troponins are pending.  The patient's troponins are negative.  Given his risk factors I did call and discussed his case with cardiology.  They do recommend admission with the 2 separate episodes for further telemetry and echocardiogram.  Calls placed to hospital service for admission.  Amount and/or Complexity of Data Reviewed Labs: ordered.  Risk Prescription drug management. Decision regarding hospitalization.          Final Clinical Impression(s) / ED Diagnoses Final diagnoses:  Syncope, unspecified syncope type    Rx / DC Orders ED Discharge Orders     None         Durwin Glaze, MD 04/23/23 2230

## 2023-04-23 NOTE — Consult Note (Signed)
 Cardiology Consultation   Patient ID: Ian Golden MRN: [id number]; DOB: 16-Nov-1965  Admit date: 04/23/2023 Date of Consult: 04/23/2023  PCP:  Hannah Beat, MD   [facility] Providers Cardiologist:  None        Patient Profile:   Ian Golden is a 58 y.o. male with a hx of CVA, cryptogenic stroke status post loop recorder, hyperlipidemia in place who is being seen 04/23/2023 for the evaluation of syncope at the request of Dr Rhae Hammock.  History of Present Illness:   Ian Golden is a 58 y.o. male with a hx of CVA, cryptogenic stroke  status post loop recorder in place who is being seen 04/23/2023 for the evaluation of syncope   The patient had 2 episodes of presyncope/syncope earlier this evening at church. Denies any chest pain with this. States that he felt very lightheaded and nauseated and had a near syncopal episode where he fell down to his knees. He had a second episode that was witnessed by church members.  He completely lost consciousness on the second time, he reports some mild nausea but denies any chest pain  Wife at bedside reports he has been having low-grade fever some nausea all day may be catching a cold or something.  Patient looks dehydrated. Patient and multiple family members are diagnosed with hypercoagulable disorder unclear what it is, he is on aspirin therapy but denies any history of DVT or PE Trops negative EKG NSR with non specific ST T wave changes  Last recorded transmission was 03/20/2023 which was no A-fib, no other arrhythmias  Cardiac testing: ECHO: 05/23/21 IMPRESSIONS     1. Left ventricular ejection fraction, by estimation, is 60 to 65%. The  left ventricle has normal function. The left ventricle has no regional  wall motion abnormalities.   2. Right ventricular systolic function is normal. The right ventricular  size is normal.   3. No left atrial/left atrial appendage thrombus was detected.   4. The mitral valve is normal in structure.  Trivial mitral valve  regurgitation. No evidence of mitral stenosis.   5. The aortic valve is normal in structure. Aortic valve regurgitation is  not visualized. No aortic stenosis is present.   6. The inferior vena cava is normal in size with greater than 50%  respiratory variability, suggesting right atrial pressure of 3 mmHg.   7. Mobile atrial septum no PFO by color flow and negative bubble study.    Past Medical History:  Diagnosis Date   Allergic rhinitis, cause unspecified    Hemorrhoids    Migraine    Ulcer     Past Surgical History:  Procedure Laterality Date   BUBBLE STUDY  05/23/2021   Procedure: BUBBLE STUDY;  Surgeon: Wendall Stade, MD;  Location: [facility] ENDOSCOPY;  Service: Cardiovascular;;   LOOP RECORDER INSERTION N/A 05/23/2021   Procedure: LOOP RECORDER INSERTION;  Surgeon: Regan Lemming, MD;  Location: [facility] INVASIVE CV LAB;  Service: Cardiovascular;  Laterality: N/A;   TEE WITHOUT CARDIOVERSION N/A 05/23/2021   Procedure: TRANSESOPHAGEAL ECHOCARDIOGRAM (TEE);  Surgeon: Wendall Stade, MD;  Location: [facility] ENDOSCOPY;  Service: Cardiovascular;  Laterality: N/A;   WISDOM TOOTH EXTRACTION     with sedation     Home Medications:  Prior to Admission medications   Medication Sig Start Date End Date Taking? Authorizing Provider  Ascorbic Acid (VITAMIN C PO) Take 1 tablet by mouth daily.    [provider]  aspirin EC 81 MG tablet  Take 1 tablet (81 mg total) by mouth daily. Swallow whole. 05/23/21   Osvaldo Shipper, MD  atorvastatin (LIPITOR) 80 MG tablet TAKE 1 TABLET (80 MG TOTAL) BY MOUTH DAILY. 08/28/22   Copland, Karleen Hampshire, MD  fluocinonide ointment (LIDEX) 0.05 % Apply topically 2 (two) times daily. 09/24/16   Copland, Karleen Hampshire, MD  fluticasone (FLONASE) 50 MCG/ACT nasal spray Place 1-2 sprays into both nostrils daily. Patient not taking: Reported on 08/27/2022 05/23/21   Osvaldo Shipper, MD  SUMAtriptan (IMITREX) 6 MG/0.5ML SOSY injection Inject 0.5 mLs (6 mg total) into  the skin every 2 (two) hours as needed for migraine or headache. Max 2 doses/24 hours 07/25/22   Copland, Karleen Hampshire, MD    Inpatient Medications: Scheduled Meds:  Continuous Infusions:  PRN Meds:   Allergies:   No Known Allergies  Social History:   Social History   Socioeconomic History   Marital status: Married    Spouse name: Stanton Kidney   Number of children: 3   Years of education: Not on file   Highest education level: Not on file  Occupational History    Employer: [employer]    Comment: Patent examiner  Tobacco Use   Smoking status: Never   Smokeless tobacco: Never  Substance and Sexual Activity   Alcohol use: Never    Alcohol/week: 0.0 standard drinks of alcohol   Drug use: Never   Sexual activity: Yes    Partners: Female  Other Topics Concern   Not on file  Social History Narrative   Married, 3 adopted children   Hydrographic surveyor   Regular exercise- no   Right handed   Social Drivers of Corporate investment banker Strain: Not on file  Food Insecurity: Not on file  Transportation Needs: Not on file  Physical Activity: Not on file  Stress: Not on file  Social Connections: Not on file  Intimate Partner Violence: Not on file    Family History:    Family History  Problem Relation Age of Onset   Colon polyps Father    Heart attack Mother    Heart disease Mother    Other Mother        coroner said there were signs of possible start of dementia    Colon cancer Neg Hx    Esophageal cancer Neg Hx    Rectal cancer Neg Hx    Stomach cancer Neg Hx      ROS:  Please see the history of present illness.   All other ROS reviewed and negative.     Physical Exam/Data:   Vitals:   04/23/23 1920 04/23/23 2000 04/23/23 2011 04/23/23 2200  BP: (!) 125/102 135/81  132/78  Pulse: 90 89  88  Resp: (!) 21 (!) 23  (!) 23  Temp:   99.7 F (37.6 C)   TempSrc:   Oral   SpO2: 100% 96%  95%  Weight:      Height:       No intake or output data in the 24  hours ending 04/23/23 2210    04/23/2023    6:30 PM 06/06/2022    9:00 AM 01/25/2022   10:14 AM  Last 3 Weights  Weight (lbs) 195 lb 190 lb 8 oz 198 lb  Weight (kg) 88.451 kg 86.41 kg 89.812 kg     Body mass index is 25.73 kg/m.  General:  Well nourished, well developed, in no acute distress HEENT: normal Neck: no JVD Vascular: No carotid bruits; Distal pulses 2+  bilaterally Cardiac:  normal S1, S2; RRR; no murmur  Lungs:  clear to auscultation bilaterally, no wheezing, rhonchi or rales  Abd: soft, nontender, no hepatomegaly  Ext: no edema Musculoskeletal:  No deformities, BUE and BLE strength normal and equal Skin: warm and dry  Neuro:  CNs 2-12 intact, no focal abnormalities noted Psych:  Normal affect   EKG:  The EKG was personally reviewed and demonstrates:  NSR, T wave inversions, nonspecific ST-T wave changes Telemetry:  Telemetry was personally reviewed and demonstrates:    Relevant CV Studies: As noted above  Laboratory Data:  High Sensitivity Troponin:   Recent Labs  Lab 04/23/23 1854 04/23/23 2055  TROPONINIHS 8 10     Chemistry Recent Labs  Lab 04/23/23 1845  NA 139  K 4.4  CL 105  CO2 24  GLUCOSE 103*  BUN 28*  CREATININE 1.09  CALCIUM 9.1  GFRNONAA >60  ANIONGAP 10    Recent Labs  Lab 04/23/23 1854  PROT 7.0  ALBUMIN 3.9  AST 27  ALT 29  ALKPHOS 85  BILITOT 0.7   Lipids No results for input(s): "CHOL", "TRIG", "HDL", "LABVLDL", "LDLCALC", "CHOLHDL" in the last 168 hours.  Hematology Recent Labs  Lab 04/23/23 1845  WBC 9.9  RBC 5.36  HGB 16.3  HCT 47.4  MCV 88.4  MCH 30.4  MCHC 34.4  RDW 12.6  PLT 168   Thyroid No results for input(s): "TSH", "FREET4" in the last 168 hours.  BNPNo results for input(s): "BNP", "PROBNP" in the last 168 hours.  DDimer  Recent Labs  Lab 04/23/23 1928  DDIMER 0.30     Radiology/Studies:  No results found.   Assessment and Plan:   Syncope likely vasovagal Cryptogenic stroke status post  loop recorder includes Nausea and vomiting unclear etiology  Plan: Admit to medicine for observation: -Continue telemonitoring, encourage p.o. hydration, antinausea medication.  If persistent nausea consider IV hydration, further evaluation of nausea per hospitalist/attending. Will interrogate his ILR to see if there are any events,  Less likely to be cardiac syncope as he had prodromal symptoms and more suggestive of vasovagal episode, EKG is benign like nonspecific changes as before. -Obtain echocardiogram in a.m. Cardiology will follow  Risk Assessment/Risk Scores:                For questions or updates, please contact [facility] Please consult [email or web address] for contact info under    Signed, Elmon Kirschner, MD  04/23/2023 10:10 PM

## 2023-04-24 ENCOUNTER — Observation Stay (HOSPITAL_COMMUNITY)

## 2023-04-24 ENCOUNTER — Encounter (HOSPITAL_COMMUNITY): Payer: Self-pay | Admitting: Internal Medicine

## 2023-04-24 DIAGNOSIS — R55 Syncope and collapse: Secondary | ICD-10-CM | POA: Diagnosis not present

## 2023-04-24 DIAGNOSIS — Z8673 Personal history of transient ischemic attack (TIA), and cerebral infarction without residual deficits: Secondary | ICD-10-CM

## 2023-04-24 DIAGNOSIS — R110 Nausea: Secondary | ICD-10-CM | POA: Insufficient documentation

## 2023-04-24 DIAGNOSIS — E782 Mixed hyperlipidemia: Secondary | ICD-10-CM | POA: Diagnosis not present

## 2023-04-24 HISTORY — DX: Personal history of transient ischemic attack (TIA), and cerebral infarction without residual deficits: Z86.73

## 2023-04-24 LAB — BASIC METABOLIC PANEL
Anion gap: 11 (ref 5–15)
BUN: 27 mg/dL — ABNORMAL HIGH (ref 6–20)
CO2: 24 mmol/L (ref 22–32)
Calcium: 8.4 mg/dL — ABNORMAL LOW (ref 8.9–10.3)
Chloride: 106 mmol/L (ref 98–111)
Creatinine, Ser: 0.91 mg/dL (ref 0.61–1.24)
GFR, Estimated: >60 mL/min (ref >60)
Glucose, Bld: 112 mg/dL — ABNORMAL HIGH (ref 70–99)
Potassium: 3.7 mmol/L (ref 3.5–5.1)
Sodium: 141 mmol/L (ref 135–145)

## 2023-04-24 LAB — ECHOCARDIOGRAM COMPLETE
AR max vel: 3.47 cm2
AV Peak grad: 8.1 mmHg
Ao pk vel: 1.42 m/s
Area-P 1/2: 2.99 cm2
Height: 73 in
S' Lateral: 2.9 cm
Weight: 3120 [oz_av]

## 2023-04-24 LAB — HIV ANTIBODY (ROUTINE TESTING W REFLEX): HIV Screen 4th Generation wRfx: NONREACTIVE

## 2023-04-24 LAB — CBC WITH DIFFERENTIAL/PLATELET
Abs Immature Granulocytes: 0.02 10*3/uL (ref 0.00–0.07)
Basophils Absolute: 0 10*3/uL (ref 0.0–0.1)
Basophils Relative: 0 %
Eosinophils Absolute: 0 10*3/uL (ref 0.0–0.5)
Eosinophils Relative: 0 %
HCT: 42.1 % (ref 39.0–52.0)
Hemoglobin: 14.5 g/dL (ref 13.0–17.0)
Immature Granulocytes: 0 %
Lymphocytes Relative: 6 %
Lymphs Abs: 0.4 10*3/uL — ABNORMAL LOW (ref 0.7–4.0)
MCH: 30.3 pg (ref 26.0–34.0)
MCHC: 34.4 g/dL (ref 30.0–36.0)
MCV: 88.1 fL (ref 80.0–100.0)
Monocytes Absolute: 0.4 10*3/uL (ref 0.1–1.0)
Monocytes Relative: 7 %
Neutro Abs: 5.3 10*3/uL (ref 1.7–7.7)
Neutrophils Relative %: 87 %
Platelets: 164 10*3/uL (ref 150–400)
RBC: 4.78 MIL/uL (ref 4.22–5.81)
RDW: 12.6 % (ref 11.5–15.5)
WBC: 6.1 10*3/uL (ref 4.0–10.5)
nRBC: 0 % (ref 0.0–0.2)

## 2023-04-24 LAB — TSH: TSH: 0.892 u[IU]/mL (ref 0.350–4.500)

## 2023-04-24 MED ORDER — SODIUM CHLORIDE (PF) 0.9 % IJ SOLN
INTRAMUSCULAR | Status: AC
  Administered 2023-04-24: 10 mL
  Filled 2023-04-24: qty 10

## 2023-04-24 MED ORDER — LACTATED RINGERS IV SOLN: INTRAVENOUS | Status: AC

## 2023-04-24 MED ORDER — ASPIRIN 81 MG PO TBEC
81.0000 mg | DELAYED_RELEASE_TABLET | Freq: Every day | ORAL | Status: DC
  Administered 2023-04-24 – 2023-04-25 (×2): 81 mg via ORAL
  Filled 2023-04-24 (×2): qty 1

## 2023-04-24 MED ORDER — ORAL CARE MOUTH RINSE: 15.0000 mL | OROMUCOSAL | Status: DC | PRN

## 2023-04-24 MED ORDER — ACETAMINOPHEN 325 MG PO TABS
650.0000 mg | ORAL_TABLET | Freq: Four times a day (QID) | ORAL | Status: DC | PRN
  Administered 2023-04-24: 650 mg via ORAL
  Filled 2023-04-24 (×2): qty 2

## 2023-04-24 MED ORDER — ENOXAPARIN SODIUM 40 MG/0.4ML IJ SOSY
40.0000 mg | PREFILLED_SYRINGE | INTRAMUSCULAR | Status: DC
  Administered 2023-04-24 – 2023-04-25 (×2): 40 mg via SUBCUTANEOUS
  Filled 2023-04-24 (×2): qty 0.4

## 2023-04-24 MED ORDER — ATORVASTATIN CALCIUM 80 MG PO TABS
80.0000 mg | ORAL_TABLET | Freq: Every day | ORAL | Status: DC
  Administered 2023-04-24 – 2023-04-25 (×2): 80 mg via ORAL
  Filled 2023-04-24: qty 2
  Filled 2023-04-24: qty 1

## 2023-04-24 MED ORDER — ONDANSETRON HCL 4 MG/2ML IJ SOLN
4.0000 mg | Freq: Once | INTRAMUSCULAR | Status: AC
  Administered 2023-04-24: 4 mg via INTRAVENOUS
  Filled 2023-04-24: qty 2

## 2023-04-24 MED ORDER — PANTOPRAZOLE SODIUM 40 MG IV SOLR
40.0000 mg | Freq: Two times a day (BID) | INTRAVENOUS | Status: DC
  Administered 2023-04-24 – 2023-04-25 (×3): 40 mg via INTRAVENOUS
  Filled 2023-04-24 (×3): qty 10

## 2023-04-24 MED ORDER — ONDANSETRON HCL 4 MG/2ML IJ SOLN
4.0000 mg | Freq: Four times a day (QID) | INTRAMUSCULAR | Status: DC | PRN
  Administered 2023-04-24: 4 mg via INTRAVENOUS
  Filled 2023-04-24: qty 2

## 2023-04-24 MED ORDER — SUMATRIPTAN SUCCINATE 6 MG/0.5ML ~~LOC~~ SOLN
6.0000 mg | SUBCUTANEOUS | Status: DC | PRN
  Administered 2023-04-24: 6 mg via SUBCUTANEOUS
  Filled 2023-04-24: qty 0.5

## 2023-04-24 NOTE — Progress Notes (Signed)
 PROGRESS NOTE    Ian Golden  WGN:[id number] DOB: 1965-11-11 DOA: 04/23/2023 PCP: Hannah Beat, MD   Brief Narrative: 58 year old with past medical history significant for cryptogenic stroke status post loop recorder, hyperlipidemia, migraine presented to the ED after syncopal episode x 2 episodes.  He has also been having some nausea after eating.  He takes ibuprofen every other day.  Cardiology was consulted and think episode is vasovagal in etiology recommended hydration.   Assessment & Plan:   Principal Problem:   Syncope Active Problems:   Hyperlipidemia   History of ischemic stroke   Nausea  1-Syncope: In the setting of vasovagal and or dehydration Continue with IV fluids Follow 2D echo TSH: 0.8 Proceed with MRI due to nausea and dizziness.  MRI negative for new stroke D-dimer 0.3, troponin 8 Plan to continue with hydration 2D echo order Appreciate cardiology evaluation.  Check chest x ray. Negative except for left chest cardiac loop recorder versus superficial ICD.  2-Nausea, Dyspepsia: -Patient having also nausea, feels hot, denies abdominal pain.  He does take ibuprofen.  He reports of burping a lot -Start IV Protonix twice daily -As needed Zofran -Liver function test, lipase normal -KUB : Nonobstructed bowel-gas pattern.  3-history of cryptogenic stroke: Continue aspirin and statin  4-history of migraine: Resume sumatriptan   Estimated body mass index is 25.73 kg/m as calculated from the following:   Height as of this encounter: 6\' 1"  (1.854 m).   Weight as of this encounter: 88.5 kg.   DVT prophylaxis: Lovenox Code Status: Full code Family Communication: Disposition Plan:  Status is: Observation The patient remains OBS appropriate and will d/c before 2 midnights.    Consultants:  Cardiology  Procedures:  ECHO pending  Antimicrobials:    Subjective: He report 2 episodes of syncope yesterday. He had nausea again this am, was feeling  hot.  He does take ibuprofen for knee pain, takes ibuprofen every other day.   Objective: Vitals:   04/24/23 0100 04/24/23 0115 04/24/23 0200 04/24/23 0628  BP: 118/69 128/80 127/71 111/69  Pulse: 90 87 91 85  Resp: (!) 26 18 (!) 24 (!) 23  Temp:    98.6 F (37 C)  TempSrc:    Oral  SpO2: 94% 95% 96% 94%  Weight:      Height:       No intake or output data in the 24 hours ending 04/24/23 0851 Filed Weights   04/23/23 1830  Weight: 88.5 kg    Examination:  General exam: Appears calm and comfortable  Respiratory system: Clear to auscultation. Respiratory effort normal. Cardiovascular system: S1 & S2 heard, RRR. No JVD, murmurs, rubs, gallops or clicks. No pedal edema. Gastrointestinal system: Abdomen is nondistended, soft and nontender. No organomegaly or masses felt. Normal bowel sounds heard. Central nervous system: Alert and oriented. No focal neurological deficits. Extremities: Symmetric 5 x 5 power.  Data Reviewed: I have personally reviewed following labs and imaging studies  CBC: Recent Labs  Lab 04/23/23 1845 04/24/23 0619  WBC 9.9 6.1  NEUTROABS  --  5.3  HGB 16.3 14.5  HCT 47.4 42.1  MCV 88.4 88.1  PLT 168 164   Basic Metabolic Panel: Recent Labs  Lab 04/23/23 1845 04/24/23 0619  NA 139 141  K 4.4 3.7  CL 105 106  CO2 24 24  GLUCOSE 103* 112*  BUN 28* 27*  CREATININE 1.09 0.91  CALCIUM 9.1 8.4*   GFR: Estimated Creatinine Clearance: 100 mL/min (by C-G formula based  on SCr of 0.91 mg/dL). Liver Function Tests: Recent Labs  Lab 04/23/23 1854  AST 27  ALT 29  ALKPHOS 85  BILITOT 0.7  PROT 7.0  ALBUMIN 3.9   Recent Labs  Lab 04/23/23 1854  LIPASE 35   No results for input(s): "AMMONIA" in the last 168 hours. Coagulation Profile: No results for input(s): "INR", "PROTIME" in the last 168 hours. Cardiac Enzymes: No results for input(s): "CKTOTAL", "CKMB", "CKMBINDEX", "TROPONINI" in the last 168 hours. BNP (last 3 results) No results  for input(s): "PROBNP" in the last 8760 hours. HbA1C: No results for input(s): "HGBA1C" in the last 72 hours. CBG: Recent Labs  Lab 04/23/23 1835  GLUCAP 84   Lipid Profile: No results for input(s): "CHOL", "HDL", "LDLCALC", "TRIG", "CHOLHDL", "LDLDIRECT" in the last 72 hours. Thyroid Function Tests: Recent Labs    04/24/23 0619  TSH 0.892   Anemia Panel: No results for input(s): "VITAMINB12", "FOLATE", "FERRITIN", "TIBC", "IRON", "RETICCTPCT" in the last 72 hours. Sepsis Labs: No results for input(s): "PROCALCITON", "LATICACIDVEN" in the last 168 hours.  Recent Results (from the past 240 hours)  Resp panel by RT-PCR (RSV, Flu A&B, Covid) Urine, Clean Catch     Status: None   Collection Time: 04/23/23  8:19 PM   Specimen: Urine, Clean Catch; Nasal Swab  Result Value Ref Range Status   SARS Coronavirus 2 by RT PCR NEGATIVE NEGATIVE Final   Influenza A by PCR NEGATIVE NEGATIVE Final   Influenza B by PCR NEGATIVE NEGATIVE Final    Comment: (NOTE) The Xpert Xpress SARS-CoV-2/FLU/RSV plus assay is intended as an aid in the diagnosis of influenza from Nasopharyngeal swab specimens and should not be used as a sole basis for treatment. Nasal washings and aspirates are unacceptable for Xpert Xpress SARS-CoV-2/FLU/RSV testing.  Fact Sheet for Patients: BloggerCourse.com  Fact Sheet for Healthcare Providers: SeriousBroker.it  This test is not yet approved or cleared by the [place] FDA and has been authorized for detection and/or diagnosis of SARS-CoV-2 by FDA under an Emergency Use Authorization (EUA). This EUA will remain in effect (meaning this test can be used) for the duration of the COVID-19 declaration under Section 564(b)(1) of the Act, 21 U.S.C. section 360bbb-3(b)(1), unless the authorization is terminated or revoked.     Resp Syncytial Virus by PCR NEGATIVE NEGATIVE Final    Comment: (NOTE) Fact Sheet for  Patients: BloggerCourse.com  Fact Sheet for Healthcare Providers: SeriousBroker.it  This test is not yet approved or cleared by the [place] FDA and has been authorized for detection and/or diagnosis of SARS-CoV-2 by FDA under an Emergency Use Authorization (EUA). This EUA will remain in effect (meaning this test can be used) for the duration of the COVID-19 declaration under Section 564(b)(1) of the Act, 21 U.S.C. section 360bbb-3(b)(1), unless the authorization is terminated or revoked.  Performed at [facility], [address] [place], [place] [address]          Radiology Studies: No results found.      Scheduled Meds:  aspirin EC  81 mg Oral Daily   atorvastatin  80 mg Oral Daily   enoxaparin (LOVENOX) injection  40 mg Subcutaneous Q24H   pantoprazole (PROTONIX) IV  40 mg Intravenous Q12H   Continuous Infusions:  lactated ringers 75 mL/hr at 04/24/23 0627     LOS: 0 days    Time spent: 35 minutes    Sadrac Zeoli A Clarence Cogswell, MD [facility]   If 7PM-7AM, please contact night-coverage [email or web address]  04/24/2023, 8:51 AM

## 2023-04-24 NOTE — ED Provider Notes (Signed)
  Provider Note MRN:  [id number]  Arrival date & time: 04/24/23    ED Course and Medical Decision Making  Assumed care of patient at sign-out or upon transfer.  Recurrent syncopal event with cardiology consultation, the recommendation is admission for telemetry and echocardiogram.  Awaiting hospitalist acceptance.  Procedures  Final Clinical Impressions(s) / ED Diagnoses     ICD-10-CM   1. Syncope, unspecified syncope type  R55       ED Discharge Orders     None       Discharge Instructions   None     Elmer Sow. Pilar Plate, MD [facility] Emergency Medicine [facility] [email or web address]    Sabas Sous, MD 04/24/23 [phone]

## 2023-04-24 NOTE — Plan of Care (Signed)
  Problem: Clinical Measurements: Goal: Diagnostic test results will improve Outcome: Completed/Met

## 2023-04-24 NOTE — Plan of Care (Signed)

## 2023-04-24 NOTE — H&P (Signed)
 History and Physical    Ian Golden XBM:[id number] DOB: 25-Dec-1965 DOA: 04/23/2023  Patient coming from: Home.  Chief Complaint: Loss of consciousness.  HPI: Ian Golden is a 58 y.o. male with history of cryptogenic stroke has a loop recorder, hyperlipidemia, migraine was brought to the ER after patient had a syncopal episode.  Patient states he was at the church when the incident happened.  He was feeling nauseated since morning after he ate lunch.  At the church he was feeling nauseated and dizzy.  He had a brief episode of near syncope when he fell onto the floor.  He got up and went inside when he passed out.  He thinks he may have passed out for less than a minute.  Denies any chest pain palpitations prior to passing out.  Did have some nausea.  He did not have any associated incontinent of urine or bowel.  Patient ate some [employer] salad before going to discharge.  He felt nauseated after that but his wife also ate the same thing but was doing fine.  ED Course: In the ER EKG shows sinus tachycardia.  Troponins were negative.  Cardiology was consulted patient is being admitted for further observation.  Labs show hemoglobin of 16.3 creatinine 1.09 D-dimer 0.3.  Review of Systems: As per HPI, rest all negative.   Past Medical History:  Diagnosis Date   Allergic rhinitis, cause unspecified    Hemorrhoids    Migraine    Ulcer     Past Surgical History:  Procedure Laterality Date   BUBBLE STUDY  05/23/2021   Procedure: BUBBLE STUDY;  Surgeon: Wendall Stade, MD;  Location: [facility] ENDOSCOPY;  Service: Cardiovascular;;   LOOP RECORDER INSERTION N/A 05/23/2021   Procedure: LOOP RECORDER INSERTION;  Surgeon: Regan Lemming, MD;  Location: [facility] INVASIVE CV LAB;  Service: Cardiovascular;  Laterality: N/A;   TEE WITHOUT CARDIOVERSION N/A 05/23/2021   Procedure: TRANSESOPHAGEAL ECHOCARDIOGRAM (TEE);  Surgeon: Wendall Stade, MD;  Location: [facility] ENDOSCOPY;  Service: Cardiovascular;  Laterality:  N/A;   WISDOM TOOTH EXTRACTION     with sedation     reports that he has never smoked. He has never used smokeless tobacco. He reports that he does not drink alcohol and does not use drugs.  No Known Allergies  Family History  Problem Relation Age of Onset   Colon polyps Father    Heart attack Mother    Heart disease Mother    Other Mother        coroner said there were signs of possible start of dementia    Colon cancer Neg Hx    Esophageal cancer Neg Hx    Rectal cancer Neg Hx    Stomach cancer Neg Hx     Prior to Admission medications   Medication Sig Start Date End Date Taking? Authorizing Provider  Ascorbic Acid (VITAMIN C PO) Take 1 tablet by mouth daily.   Yes [provider]  aspirin EC 81 MG tablet Take 1 tablet (81 mg total) by mouth daily. Swallow whole. 05/23/21  Yes Osvaldo Shipper, MD  atorvastatin (LIPITOR) 80 MG tablet TAKE 1 TABLET (80 MG TOTAL) BY MOUTH DAILY. 08/28/22  Yes Copland, Karleen Hampshire, MD  SUMAtriptan (IMITREX) 6 MG/0.5ML SOSY injection Inject 0.5 mLs (6 mg total) into the skin every 2 (two) hours as needed for migraine or headache. Max 2 doses/24 hours 07/25/22  Yes Copland, Karleen Hampshire, MD    Physical Exam: Constitutional: Moderately built and nourished. Vitals:  04/24/23 0035 04/24/23 0100 04/24/23 0115 04/24/23 0200  BP:  118/69 128/80 127/71  Pulse:  90 87 91  Resp:  (!) 26 18 (!) 24  Temp: 99.8 F (37.7 C)     TempSrc: Oral     SpO2:  94% 95% 96%  Weight:      Height:       Eyes: Anicteric no pallor. ENMT: No discharge from the ears eyes nose or mouth. Neck: No mass felt.  No neck rigidity. Respiratory: No rhonchi or crepitations. Cardiovascular: S1-S2 heard. Abdomen: Soft nontender bowel sound present. Musculoskeletal: No edema. Skin: No rash. Neurologic: Alert awake oriented to time place and person.  Moves all extremities 5 x 5.  No facial asymmetry tongue is midline pupils are equal and reacting to light. Psychiatric: Appears  normal.  Normal affect.   Labs on Admission: I have personally reviewed following labs and imaging studies  CBC: Recent Labs  Lab 04/23/23 1845  WBC 9.9  HGB 16.3  HCT 47.4  MCV 88.4  PLT 168   Basic Metabolic Panel: Recent Labs  Lab 04/23/23 1845  NA 139  K 4.4  CL 105  CO2 24  GLUCOSE 103*  BUN 28*  CREATININE 1.09  CALCIUM 9.1   GFR: Estimated Creatinine Clearance: 83.5 mL/min (by C-G formula based on SCr of 1.09 mg/dL). Liver Function Tests: Recent Labs  Lab 04/23/23 1854  AST 27  ALT 29  ALKPHOS 85  BILITOT 0.7  PROT 7.0  ALBUMIN 3.9   Recent Labs  Lab 04/23/23 1854  LIPASE 35   No results for input(s): "AMMONIA" in the last 168 hours. Coagulation Profile: No results for input(s): "INR", "PROTIME" in the last 168 hours. Cardiac Enzymes: No results for input(s): "CKTOTAL", "CKMB", "CKMBINDEX", "TROPONINI" in the last 168 hours. BNP (last 3 results) No results for input(s): "PROBNP" in the last 8760 hours. HbA1C: No results for input(s): "HGBA1C" in the last 72 hours. CBG: Recent Labs  Lab 04/23/23 1835  GLUCAP 84   Lipid Profile: No results for input(s): "CHOL", "HDL", "LDLCALC", "TRIG", "CHOLHDL", "LDLDIRECT" in the last 72 hours. Thyroid Function Tests: No results for input(s): "TSH", "T4TOTAL", "FREET4", "T3FREE", "THYROIDAB" in the last 72 hours. Anemia Panel: No results for input(s): "VITAMINB12", "FOLATE", "FERRITIN", "TIBC", "IRON", "RETICCTPCT" in the last 72 hours. Urine analysis:    Component Value Date/Time   COLORURINE YELLOW 04/23/2023 2019   APPEARANCEUR CLEAR 04/23/2023 2019   LABSPEC 1.027 04/23/2023 2019   PHURINE 5.0 04/23/2023 2019   GLUCOSEU NEGATIVE 04/23/2023 2019   GLUCOSEU NEGATIVE 12/14/2014 1030   HGBUR NEGATIVE 04/23/2023 2019   BILIRUBINUR NEGATIVE 04/23/2023 2019   KETONESUR NEGATIVE 04/23/2023 2019   PROTEINUR NEGATIVE 04/23/2023 2019   UROBILINOGEN 0.2 12/14/2014 1030   NITRITE NEGATIVE 04/23/2023 2019    LEUKOCYTESUR NEGATIVE 04/23/2023 2019   Sepsis Labs: @LABRCNTIP (procalcitonin:4,lacticidven:4) ) Recent Results (from the past 240 hours)  Resp panel by RT-PCR (RSV, Flu A&B, Covid) Urine, Clean Catch     Status: None   Collection Time: 04/23/23  8:19 PM   Specimen: Urine, Clean Catch; Nasal Swab  Result Value Ref Range Status   SARS Coronavirus 2 by RT PCR NEGATIVE NEGATIVE Final   Influenza A by PCR NEGATIVE NEGATIVE Final   Influenza B by PCR NEGATIVE NEGATIVE Final    Comment: (NOTE) The Xpert Xpress SARS-CoV-2/FLU/RSV plus assay is intended as an aid in the diagnosis of influenza from Nasopharyngeal swab specimens and should not be used as a sole basis  for treatment. Nasal washings and aspirates are unacceptable for Xpert Xpress SARS-CoV-2/FLU/RSV testing.  Fact Sheet for Patients: BloggerCourse.com  Fact Sheet for Healthcare Providers: SeriousBroker.it  This test is not yet approved or cleared by the [place] FDA and has been authorized for detection and/or diagnosis of SARS-CoV-2 by FDA under an Emergency Use Authorization (EUA). This EUA will remain in effect (meaning this test can be used) for the duration of the COVID-19 declaration under Section 564(b)(1) of the Act, 21 U.S.C. section 360bbb-3(b)(1), unless the authorization is terminated or revoked.     Resp Syncytial Virus by PCR NEGATIVE NEGATIVE Final    Comment: (NOTE) Fact Sheet for Patients: BloggerCourse.com  Fact Sheet for Healthcare Providers: SeriousBroker.it  This test is not yet approved or cleared by the [place] FDA and has been authorized for detection and/or diagnosis of SARS-CoV-2 by FDA under an Emergency Use Authorization (EUA). This EUA will remain in effect (meaning this test can be used) for the duration of the COVID-19 declaration under Section 564(b)(1) of the Act, 21  U.S.C. section 360bbb-3(b)(1), unless the authorization is terminated or revoked.  Performed at [facility], [address] [place], [place] [address]      Radiological Exams on Admission: No results found.  EKG: Independently reviewed.  Sinus tachycardia.  Assessment/Plan Principal Problem:   Syncope Active Problems:   Hyperlipidemia   History of ischemic stroke    Syncope -     appreciate cardiology consult.  At this time cardiology feels patient's syncope may be vasovagal since patient was nauseated prior to the episode.  Patient's loop recorder is being interrogated and checking 2D echo.  Check orthostatics in the morning.  Gently hydrate.  Since patient has tachycardia we will check TSH. Nausea may be secondary to gastroenteritis.  Has not had any diarrhea and abdomen appears benign. History of cryptogenic stroke on aspirin and statins. Hyperlipidemia on statins. History of migraines.  Since patient has syncope will need close monitoring further workup and more than 2 midnight stay.   DVT prophylaxis: Lovenox. Code Status: Full code. Family Communication: Discussed with patient. Disposition Plan: Monitored bed. Consults called: Cardiology. Admission status: Observation.

## 2023-04-24 NOTE — Progress Notes (Addendum)
 Patient Name: Ian Golden Date of Encounter: 04/24/2023 [facility] Cardiologist: None   Interval Summary  .    Admitted overnight for syncope, felt to be vasovagal.  Today he feels fine.  Yesterday had been having significant nausea, eventually vomited.  Very dizzy and lightheaded.  Reports passing out once maybe twice with complete loss of consciousness and felt groggy after the fact until today.  No chest pain or shortness of breath.  Vital Signs .    Vitals:   04/24/23 0200 04/24/23 0628 04/24/23 0900 04/24/23 1009  BP: 127/71 111/69 119/77   Pulse: 91 85 78   Resp: (!) 24 (!) 23 19   Temp:  98.6 F (37 C)  98.8 F (37.1 C)  TempSrc:  Oral    SpO2: 96% 94% 95%   Weight:      Height:       No intake or output data in the 24 hours ending 04/24/23 1126    04/23/2023    6:30 PM 06/06/2022    9:00 AM 01/25/2022   10:14 AM  Last 3 Weights  Weight (lbs) 195 lb 190 lb 8 oz 198 lb  Weight (kg) 88.451 kg 86.41 kg 89.812 kg      Telemetry/ECG    Not able to review telemetry strips for some reason, however bedside monitor looks like sinus heart rates in the 70s- Personally Reviewed  CV Studies     Physical Exam .   GEN: No acute distress.   Neck: No JVD Cardiac: RRR, +2/6 murmur Respiratory: Clear to auscultation bilaterally. GI: Soft, nontender, non-distended  MS: No edema  Patient Profile    Ian Golden is a 58 y.o. male has hx of CVA, cryptogenic stroke status post ILR, hyperlipidemia.  Cardiology asked to see due to episode of syncope yesterday.  Assessment & Plan .     Syncope With preceding symptoms of nausea, dizziness, lightheadedness his episode seems more likely related to vasovagal event.  Lower suspicion for a being cardiac driven with prodrome.  Today we interrogated his device but unfortunately it was only programmed for tachycardic arrhythmias (no events) however this has been reprogrammed to be more comprehensive and include slow heart rates,  pauses etc.  No evidence of any conduction disease.  Will check an echocardiogram to evaluate valvular or structural abnormalities.  He does have soft murmur Continue aggressive secondary prevention with hydration, BUN mildly elevated Will ensure that telemetry strips will now be viewable. Check orthostatics, encourage hydration  Cryptogenic stroke status post ILR Nausea/vomiting Hypercoagulable disorder (not likely contributing) Per primary team  For questions or updates, please contact [facility] Please consult [email or web address] for contact info under        Signed, Abagail Kitchens, PA-C    58 year old male with syncopal episode.  Unfortunately his implantable loop recorder was set to detect atrial fibrillation/tachycardia not bradycardia.  This has been changed since this event.  His prodrome of nausea sweating dizziness lightheadedness sound vasovagal in etiology.  Discussed adequate hydration etc.  Knows to sit down or lay down if the symptoms come up again.  He does have prothrombin gene mutation, clotting disorder.  He has never had thrombosis.  He does take a low-dose aspirin.  Several of his family members have had this gene mutation as well.  His father is currently living and is 37.  Prior cryptogenic stroke status post implantable loop recorder.  1/6 systolic murmur heard on exam.  Possibly mild mitral  regurgitation.  Recent transesophageal echocardiogram on 05/23/2021 shows trivial mitral regurgitation.  I do not feel strongly that he needs to stay here for echocardiogram unless it is going to be performed fairly soon.  Otherwise, he can always get an echocardiogram as an outpatient.  I am comfortable with him being discharged.  Donato Schultz, MD

## 2023-04-24 NOTE — Progress Notes (Signed)
 Echocardiogram 2D Echocardiogram has been performed.  Lucendia Herrlich 04/24/2023, 5:11 PM

## 2023-04-25 DIAGNOSIS — R55 Syncope and collapse: Secondary | ICD-10-CM | POA: Diagnosis not present

## 2023-04-25 LAB — CUP PACEART REMOTE DEVICE CHECK
Date Time Interrogation Session: 20250304081512
Implantable Pulse Generator Implant Date: 20230404

## 2023-04-25 MED ORDER — PANTOPRAZOLE SODIUM 40 MG PO TBEC: 40.0000 mg | DELAYED_RELEASE_TABLET | Freq: Every day | ORAL | 1 refills | Status: DC

## 2023-04-25 MED ORDER — ONDANSETRON 4 MG PO TBDP: 4.0000 mg | ORAL_TABLET | Freq: Three times a day (TID) | ORAL | 0 refills | Status: AC | PRN

## 2023-04-25 NOTE — Discharge Summary (Signed)
 Physician Discharge Summary   Patient: Ian Golden MRN: [id number] DOB: 1965/07/15  Admit date:     04/23/2023  Discharge date: 04/25/23  Discharge Physician: Alba Cory   PCP: Hannah Beat, MD   Recommendations at discharge:   Follow up with PCP, post hospital follow up, repeat labs, Monitor BP FU with PCP/Hematology  for Hypercoagulable disorder.   Discharge Diagnoses: Principal Problem:   Syncope Active Problems:   Hyperlipidemia   History of ischemic stroke   Nausea  Resolved Problems:   * No resolved hospital problems. *  Hospital Course: 58 year old with past medical history significant for cryptogenic stroke status post loop recorder, hyperlipidemia, migraine presented to the ED after syncopal episode x 2 episodes. He has also been having some nausea after eating. He takes ibuprofen every other day. Cardiology was consulted and think episode is vasovagal in etiology recommended hydration.   Assessment and Plan: 1-Syncope: In the setting of vasovagal and  dehydration TSH: 0.8 Proceed with MRI due to nausea and dizziness.  MRI negative for new stroke D-dimer 0.3, troponin 8 Received IV fluids.  2D echo Normal EF Appreciate cardiology evaluation.  Check chest x ray. Negative except for left chest cardiac loop recorder versus superficial ICD.  Feels better, stable for discharge.   2-Nausea, Dyspepsia: -Patient having also nausea, feels hot, denies abdominal pain.  He does take ibuprofen.  He reports of burping a lot -Started  IV Protonix twice daily -As needed Zofran -Liver function test, lipase normal -KUB : Nonobstructed bowel-gas pattern. -Discharge on PPI. Avoid ibuprofen.  Resolved  3-history of cryptogenic stroke: Continue aspirin and statin   4-history of migraine: Resume sumatriptan     Estimated body mass index is 25.73 kg/m as calculated from the following:   Height as of this encounter: 6\' 1"  (1.854 m).   Weight as of this encounter:  88.5 kg.         Consultants: Cardiology  Procedures performed: ECHO  Disposition: Home Diet recommendation:  Discharge Diet Orders (From admission, onward)     Start     Ordered   04/25/23 0000  Diet - low sodium heart healthy        04/25/23 1031           Cardiac diet DISCHARGE MEDICATION: Allergies as of 04/25/2023   No Known Allergies      Medication List     TAKE these medications    aspirin EC 81 MG tablet Take 1 tablet (81 mg total) by mouth daily. Swallow whole.   atorvastatin 80 MG tablet Commonly known as: LIPITOR TAKE 1 TABLET (80 MG TOTAL) BY MOUTH DAILY.   ondansetron 4 MG disintegrating tablet Commonly known as: ZOFRAN-ODT Take 1 tablet (4 mg total) by mouth every 8 (eight) hours as needed for nausea or vomiting.   pantoprazole 40 MG tablet Commonly known as: Protonix Take 1 tablet (40 mg total) by mouth daily.   SUMAtriptan 6 MG/0.5ML Sosy injection Commonly known as: IMITREX Inject 0.5 mLs (6 mg total) into the skin every 2 (two) hours as needed for migraine or headache. Max 2 doses/24 hours   VITAMIN C PO Take 1 tablet by mouth daily.        Follow-up Information     Louanne Skye Devoria Albe., NP Follow up.   Specialty: Cardiology Why: Thursday May 09, 2023 Arrive by 9:50 AMAppt at 10:05 AM (25 min) Contact information: [address] [place] [address] [phone]  Discharge Exam: Filed Weights   04/23/23 1830  Weight: 88.5 kg   General; NAD  Condition at discharge: stable  The results of significant diagnostics from this hospitalization (including imaging, microbiology, ancillary and laboratory) are listed below for reference.   Imaging Studies: ECHOCARDIOGRAM COMPLETE Result Date: 04/24/2023    ECHOCARDIOGRAM REPORT   Patient Name:   Ian Golden Date of Exam: 04/24/2023 Medical Rec #:  [id number]    Height:       73.0 in Accession #:    [id number]   Weight:       195.0 lb Date of  Birth:  10/13/1965    BSA:          2.129 m Patient Age:    57 years     BP:           138/85 mmHg Patient Gender: M            HR:           75 bpm. Exam Location:  Inpatient Procedure: 2D Echo, Cardiac Doppler and Color Doppler (Both Spectral and Color            Flow Doppler were utilized during procedure). Indications:    Syncope R55  History:        Patient has prior history of Echocardiogram examinations, most                 recent 05/22/2021. Stroke and Migraine, Signs/Symptoms:Syncope;                 Risk Factors:Dyslipidemia.  Sonographer:    Lucendia Herrlich RCS Referring Phys: 22 ARSHAD N KAKRAKANDY IMPRESSIONS  1. 3D EF and strain not performed. Left ventricular ejection fraction, by estimation, is 60 to 65%. The left ventricle has normal function. The left ventricle has no regional wall motion abnormalities. Left ventricular diastolic parameters were normal.  2. Right ventricular systolic function is normal. The right ventricular size is normal.  3. Left atrial size was mildly dilated.  4. The mitral valve is abnormal. Trivial mitral valve regurgitation. No evidence of mitral stenosis.  5. The aortic valve is tricuspid. Aortic valve regurgitation is trivial. No aortic stenosis is present.  6. Aortic dilatation noted. There is mild dilatation of the aortic root, measuring 38 mm. There is mild dilatation of the ascending aorta, measuring 38 mm.  7. The inferior vena cava is normal in size with greater than 50% respiratory variability, suggesting right atrial pressure of 3 mmHg. FINDINGS  Left Ventricle: 3D EF and strain not performed. Left ventricular ejection fraction, by estimation, is 60 to 65%. The left ventricle has normal function. The left ventricle has no regional wall motion abnormalities. Strain was performed and the global longitudinal strain is indeterminate. The left ventricular internal cavity size was normal in size. There is no left ventricular hypertrophy. Left ventricular diastolic  parameters were normal. Right Ventricle: The right ventricular size is normal. No increase in right ventricular wall thickness. Right ventricular systolic function is normal. Left Atrium: Left atrial size was mildly dilated. Right Atrium: Right atrial size was normal in size. Pericardium: Trivial pericardial effusion is present. The pericardial effusion is posterior to the left ventricle. Mitral Valve: The mitral valve is abnormal. There is mild thickening of the mitral valve leaflet(s). Trivial mitral valve regurgitation. No evidence of mitral valve stenosis. Tricuspid Valve: The tricuspid valve is normal in structure. Tricuspid valve regurgitation is mild . No evidence of tricuspid stenosis. Aortic Valve: The aortic  valve is tricuspid. Aortic valve regurgitation is trivial. No aortic stenosis is present. Aortic valve peak gradient measures 8.1 mmHg. Pulmonic Valve: The pulmonic valve was normal in structure. Pulmonic valve regurgitation is not visualized. No evidence of pulmonic stenosis. Aorta: Aortic dilatation noted. There is mild dilatation of the aortic root, measuring 38 mm. There is mild dilatation of the ascending aorta, measuring 38 mm. Venous: The inferior vena cava is normal in size with greater than 50% respiratory variability, suggesting right atrial pressure of 3 mmHg. IAS/Shunts: No atrial level shunt detected by color flow Doppler. Additional Comments: 3D was performed not requiring image post processing on an independent workstation and was indeterminate.  LEFT VENTRICLE PLAX 2D LVIDd:         4.70 cm   Diastology LVIDs:         2.90 cm   LV e' medial:    9.14 cm/s LV PW:         1.00 cm   LV E/e' medial:  8.0 LV IVS:        0.90 cm   LV e' lateral:   10.40 cm/s LVOT diam:     2.50 cm   LV E/e' lateral: 7.1 LV SV:         103 LV SV Index:   48 LVOT Area:     4.91 cm  RIGHT VENTRICLE             IVC RV S prime:     13.50 cm/s  IVC diam: 2.20 cm TAPSE (M-mode): 2.3 cm LEFT ATRIUM             Index         RIGHT ATRIUM           Index LA diam:        4.10 cm 1.93 cm/m   RA Area:     19.60 cm LA Vol (A2C):   52.0 ml 24.43 ml/m  RA Volume:   50.30 ml  23.63 ml/m LA Vol (A4C):   50.0 ml 23.49 ml/m LA Biplane Vol: 51.0 ml 23.96 ml/m  AORTIC VALVE AV Area (Vmax): 3.47 cm AV Vmax:        142.00 cm/s AV Peak Grad:   8.1 mmHg LVOT Vmax:      100.30 cm/s LVOT Vmean:     68.600 cm/s LVOT VTI:       0.210 m  AORTA Ao Root diam: 3.80 cm Ao Asc diam:  3.80 cm MITRAL VALVE MV Area (PHT): 2.99 cm     SHUNTS MV Decel Time: 254 msec     Systemic VTI:  0.21 m MV E velocity: 73.40 cm/s   Systemic Diam: 2.50 cm MV A velocity: 100.00 cm/s MV E/A ratio:  0.73 Charlton Haws MD Electronically signed by Charlton Haws MD Signature Date/Time: 04/24/2023/5:12:51 PM    Final    DG Chest 1 View Result Date: 04/24/2023 CLINICAL DATA:  58 year old male with syncope, headache. MRI clearance. EXAM: CHEST  1 VIEW COMPARISON:  CTA head and neck 05/21/2021. FINDINGS: PA view of the chest at 0917 hours. There is a left chest cardiac loop recorder or lead less ICD, new since the 2023 comparison. Normal lung volumes. Normal cardiac size and mediastinal contours. Visualized tracheal air column is within normal limits. Both lungs appear clear. No pneumothorax or pleural effusion. Negative visible bowel gas. No acute osseous abnormality identified. IMPRESSION: Negative except for left chest cardiac loop recorder versus superficial ICD. Electronically Signed  By: Odessa Fleming M.D.   On: 04/24/2023 11:09   DG Abd 1 View Result Date: 04/24/2023 CLINICAL DATA:  58 year old male with syncope, headache. MRI clearance. EXAM: ABDOMEN - 1 VIEW COMPARISON:  Lumbar MRI 05/05/2017. FINDINGS: No radiopaque foreign body identified. Nonobstructed bowel-gas pattern. Pelvic phleboliths. No acute osseous abnormality identified. IMPRESSION: No metallic foreign body identified. Electronically Signed   By: Odessa Fleming M.D.   On: 04/24/2023 11:07   MR BRAIN WO  CONTRAST Result Date: 04/24/2023 CLINICAL DATA:  58 year old male with syncope.  Headache, nausea. EXAM: MRI HEAD WITHOUT CONTRAST TECHNIQUE: Multiplanar, multiecho pulse sequences of the brain and surrounding structures were obtained without intravenous contrast. COMPARISON:  Brain MRI 09/18/2022 and earlier. FINDINGS: Brain: No restricted diffusion to suggest acute infarction. No midline shift, mass effect, evidence of mass lesion, ventriculomegaly, extra-axial collection or acute intracranial hemorrhage. Cervicomedullary junction and pituitary are within normal limits. Stable and normal cerebral volume. Stable and largely normal gray and white matter signal throughout the brain; evidence again of tiny chronic left cerebellar hemisphere infarcts, best seen on series 9, image 9. These were acute in 2023 by MRI. No other residual encephalomalacia. No chronic cerebral blood products identified. Vascular: Major intracranial vascular flow voids are stable. Skull and upper cervical spine: Negative. Visualized bone marrow signal is within normal limits. Sinuses/Orbits: Stable and negative. Other: Grossly normal visible internal auditory structures. Negative visible scalp and face. IMPRESSION: No acute intracranial abnormality. Stable noncontrast MRI appearance of the Brain, negative except for small previous cerebellar infarcts (2023). Electronically Signed   By: Odessa Fleming M.D.   On: 04/24/2023 11:06    Microbiology: Results for orders placed or performed during the hospital encounter of 04/23/23  Resp panel by RT-PCR (RSV, Flu A&B, Covid) Urine, Clean Catch     Status: None   Collection Time: 04/23/23  8:19 PM   Specimen: Urine, Clean Catch; Nasal Swab  Result Value Ref Range Status   SARS Coronavirus 2 by RT PCR NEGATIVE NEGATIVE Final   Influenza A by PCR NEGATIVE NEGATIVE Final   Influenza B by PCR NEGATIVE NEGATIVE Final    Comment: (NOTE) The Xpert Xpress SARS-CoV-2/FLU/RSV plus assay is intended as an  aid in the diagnosis of influenza from Nasopharyngeal swab specimens and should not be used as a sole basis for treatment. Nasal washings and aspirates are unacceptable for Xpert Xpress SARS-CoV-2/FLU/RSV testing.  Fact Sheet for Patients: BloggerCourse.com  Fact Sheet for Healthcare Providers: SeriousBroker.it  This test is not yet approved or cleared by the [place] FDA and has been authorized for detection and/or diagnosis of SARS-CoV-2 by FDA under an Emergency Use Authorization (EUA). This EUA will remain in effect (meaning this test can be used) for the duration of the COVID-19 declaration under Section 564(b)(1) of the Act, 21 U.S.C. section 360bbb-3(b)(1), unless the authorization is terminated or revoked.     Resp Syncytial Virus by PCR NEGATIVE NEGATIVE Final    Comment: (NOTE) Fact Sheet for Patients: BloggerCourse.com  Fact Sheet for Healthcare Providers: SeriousBroker.it  This test is not yet approved or cleared by the [place] FDA and has been authorized for detection and/or diagnosis of SARS-CoV-2 by FDA under an Emergency Use Authorization (EUA). This EUA will remain in effect (meaning this test can be used) for the duration of the COVID-19 declaration under Section 564(b)(1) of the Act, 21 U.S.C. section 360bbb-3(b)(1), unless the authorization is terminated or revoked.  Performed at [facility], [address],  [place], [place] [address]     Labs: CBC: Recent Labs  Lab 04/23/23 1845 04/24/23 0619  WBC 9.9 6.1  NEUTROABS  --  5.3  HGB 16.3 14.5  HCT 47.4 42.1  MCV 88.4 88.1  PLT 168 164   Basic Metabolic Panel: Recent Labs  Lab 04/23/23 1845 04/24/23 0619  NA 139 141  K 4.4 3.7  CL 105 106  CO2 24 24  GLUCOSE 103* 112*  BUN 28* 27*  CREATININE 1.09 0.91  CALCIUM 9.1 8.4*   Liver Function Tests: Recent Labs  Lab  04/23/23 1854  AST 27  ALT 29  ALKPHOS 85  BILITOT 0.7  PROT 7.0  ALBUMIN 3.9   CBG: Recent Labs  Lab 04/23/23 1835  GLUCAP 84    Discharge time spent: greater than 30 minutes.  Signed: Alba Cory, MD [facility] 04/25/2023

## 2023-04-25 NOTE — TOC Transition Note (Signed)
 Transition of Care [facility]) - Discharge Note   Patient Details  Name: Ian Golden MRN: [id number] Date of Birth: October 07, 1965  [facility] of [facility] CM/SW Contact:  Tom-Johnson, Hershal Coria, RN Phone Number: 04/25/2023, 10:55 AM   Clinical Narrative:     Patient is scheduled for discharge today.  Readmission Risk Assessment done. Outpatient f/u, hospital f/u and discharge instructions on AVS. No TOC needs or recommendations noted. Wife, Stanton Kidney to transport at discharge.  No further TOC needs noted.       Final next level of care: Home/Self Care Barriers to Discharge: Barriers Resolved   Patient Goals and CMS Choice Patient states their goals for this hospitalization and ongoing recovery are:: To return home CMS Medicare.gov Compare Post Acute Care list provided to:: Patient Choice offered to / list presented to : NA      Discharge Placement                Patient to be transferred to facility by: Wife Name of family member notified: Debra    Discharge Plan and Services Additional resources added to the After Visit Summary for                  DME Arranged: N/A DME Agency: NA       HH Arranged: NA HH Agency: NA        Social Drivers of Health (SDOH) Interventions SDOH Screenings   Food Insecurity: No Food Insecurity (04/24/2023)  Housing: Low Risk  (04/24/2023)  Transportation Needs: No Transportation Needs (04/24/2023)  Utilities: Not At Risk (04/24/2023)  Depression (PHQ2-9): Low Risk  (06/06/2022)  Tobacco Use: Low Risk  (04/24/2023)     Readmission Risk Interventions     No data to display

## 2023-04-25 NOTE — Plan of Care (Signed)
 Discharge instructions discussed with patient.  Patient instructed on home medications, restrictions, and follow up appointments. Belongings gathered and sent with patient.  Patients medications sent to [facility]   Patient discharged to d/c lounge

## 2023-04-26 NOTE — Progress Notes (Signed)
 [facility]link Summary Report / Loop Recorder

## 2023-05-06 DIAGNOSIS — I639 Cerebral infarction, unspecified: Secondary | ICD-10-CM

## 2023-05-08 NOTE — Progress Notes (Unsigned)
 Cardiology Office Note    Patient Name: Ian Golden Date of Encounter: 05/08/2023  Primary Care Provider:  Hannah Beat, MD Primary Cardiologist:  None Primary Electrophysiologist: None   Past Medical History    Past Medical History:  Diagnosis Date   Allergic rhinitis, cause unspecified    Hemorrhoids    Migraine    Ulcer     History of Present Illness  Ian Golden is a 58 y.o. male with a PMH of cryptogenic CVA 05/2021 with with recorder placed, HLD who presents today for posthospital follow-up.  Ian Golden was admitted/03/2021 with code stroke in the setting of confusion and underwent CTA that showed bilateral cervical ICA fibromuscular dysplasia.  MRI of the brain was also completed that showed scattered acute infarcts in the bilateral cerebral hemispheres and occipital lobe.  2D Echo with hyperdynamic EF of 75% and no shunt detected, with bilateral atrial size is normal. He underwent TEE that showed no evidence of PFO with negative bubble study.  He proceeded with loop recorder placement and was started on ASA 81 mg and Plavix 75 mg.  He was admitted on 04/23/23 with 2 episodes of presyncope/syncope that occurred while at church.  He reported some mild nausea but denied any chest pain and endorsed diaphoresis.  He was admitted for further evaluation and EKG showed sinus rhythm with nonspecific ST-T wave changes and most recent loop recorder interrogation showed no evidence of A-fib or arrhythmia.  Cardiology was consulted and felt that episode may have been related to vasovagal and possible dehydration.  He completed an MRI that was negative for stroke and D-dimer was also normal.  He was discharged the next day in stable condition.  During today's visit the patient reports*** .  Patient denies chest pain, palpitations, dyspnea, PND, orthopnea, nausea, vomiting, dizziness, syncope, edema, weight gain, or early satiety.  ***Notes: -Last ischemic evaluation: -Last echo: -Interim ED  visits: Review of Systems  Please see the history of present illness.    All other systems reviewed and are otherwise negative except as noted above.  Physical Exam    Wt Readings from Last 3 Encounters:  04/23/23 195 lb (88.5 kg)  06/06/22 190 lb 8 oz (86.4 kg)  01/25/22 198 lb (89.8 kg)   GM:WNUUV were no vitals filed for this visit.,There is no height or weight on file to calculate BMI. GEN: Well nourished, well developed in no acute distress Neck: No JVD; No carotid bruits Pulmonary: Clear to auscultation without rales, wheezing or rhonchi  Cardiovascular: Normal rate. Regular rhythm. Normal S1. Normal S2.   Murmurs: There is no murmur.  ABDOMEN: Soft, non-tender, non-distended EXTREMITIES:  No edema; No deformity   EKG/LABS/ Recent Cardiac Studies   ECG personally reviewed by me today - ***  Risk Assessment/Calculations:   {Does this patient have ATRIAL FIBRILLATION?:[phone]}      Lab Results  Component Value Date   WBC 6.1 04/24/2023   HGB 14.5 04/24/2023   HCT 42.1 04/24/2023   MCV 88.1 04/24/2023   PLT 164 04/24/2023   Lab Results  Component Value Date   CREATININE 0.91 04/24/2023   BUN 27 (H) 04/24/2023   NA 141 04/24/2023   K 3.7 04/24/2023   CL 106 04/24/2023   CO2 24 04/24/2023   Lab Results  Component Value Date   CHOL 109 05/29/2022   HDL 37.40 (L) 05/29/2022   LDLCALC 63 05/29/2022   LDLDIRECT 146.8 (H) 05/21/2021   TRIG 43.0 05/29/2022   CHOLHDL  3 05/29/2022    Lab Results  Component Value Date   HGBA1C 5.8 05/29/2022   Assessment & Plan    1.  Syncope and collapse: -Patient recently admitted with episode of syncope and collapse with MRI complete showing no evidence of stroke. -2D echo was updated and showed EF of 60 to 65% with no RWMA and mildly dilated LA with mild aortic dilation and no significant valve abnormalities. -Today patient reports***  2.  History of cryptogenic CVA: -Patient has loop recorder in place with Paceart  interrogation completed 3/6 showing no arrhythmias and normal histograms  3.  History of migraines  4.***      Disposition: Follow-up with None or APP in *** months {Are you ordering a CV Procedure (e.g. stress test, cath, DCCV, TEE, etc)?   Press F2        :[id number]}   Signed, Ian Golden, Leodis Rains, NP 05/08/2023, 9:11 AM [facility]

## 2023-05-09 ENCOUNTER — Ambulatory Visit: Attending: Nurse Practitioner | Admitting: Nurse Practitioner

## 2023-05-09 ENCOUNTER — Encounter: Payer: Self-pay | Admitting: Nurse Practitioner

## 2023-05-09 ENCOUNTER — Telehealth: Payer: Self-pay | Admitting: *Deleted

## 2023-05-09 VITALS — BP 118/78 | HR 76 | Ht 73.5 in | Wt 199.6 lb

## 2023-05-09 DIAGNOSIS — G479 Sleep disorder, unspecified: Secondary | ICD-10-CM | POA: Diagnosis not present

## 2023-05-09 DIAGNOSIS — I639 Cerebral infarction, unspecified: Secondary | ICD-10-CM | POA: Diagnosis not present

## 2023-05-09 DIAGNOSIS — R55 Syncope and collapse: Secondary | ICD-10-CM

## 2023-05-09 DIAGNOSIS — G43009 Migraine without aura, not intractable, without status migrainosus: Secondary | ICD-10-CM

## 2023-05-09 DIAGNOSIS — I351 Nonrheumatic aortic (valve) insufficiency: Secondary | ICD-10-CM

## 2023-05-09 NOTE — Telephone Encounter (Signed)
 Robin Searing, NP ORDERED ITAMAR STUDY.   Patient agreement reviewed and signed on 05/09/2023.  WatchPAT issued to patient on 05/09/2023 by Danielle Rankin, CMA. Patient aware to not open the WatchPAT box until contacted with the activation PIN. Patient profile initialized in CloudPAT on 05/09/2023 by Danielle Rankin, CMA. Device serial number: [id number]  Please list Reason for Call as Advice Only and type "WatchPAT issued to patient" in the comment box.

## 2023-05-09 NOTE — Patient Instructions (Addendum)
 Medication Instructions:  AVOID ANTIBIOTICS THAT CONTAIN FLUOROQUINOLONES  *If you need a refill on your cardiac medications before your next appointment, please call your pharmacy*   Lab Work: None ordered If you have labs (blood work) drawn today and your tests are completely normal, you will receive your results only by: MyChart Message (if you have MyChart) OR A paper copy in the mail If you have any lab test that is abnormal or we need to change your treatment, we will call you to review the results.   Testing/Procedures: Your physician has requested that you have an echocardiogram. Echocardiography is a painless test that uses sound waves to create images of your heart. It provides your doctor with information about the size and shape of your heart and how well your heart's chambers and valves are working. This procedure takes approximately one hour. There are no restrictions for this procedure. Please do NOT wear cologne, perfume, aftershave, or lotions (deodorant is allowed). Please arrive 15 minutes prior to your appointment time.  Please note: We ask at that you not bring children with you during ultrasound (echo/ vascular) testing. Due to room size and safety concerns, children are not allowed in the ultrasound rooms during exams. Our front office staff cannot provide observation of children in our lobby area while testing is being conducted. An adult accompanying a patient to their appointment will only be allowed in the ultrasound room at the discretion of the ultrasound technician under special circumstances. We apologize for any inconvenience. SCHEDULE IN MARCH 2026  Itamar sleep study  Follow-Up: At [facility], you and your health needs are our priority.  As part of our continuing mission to provide you with exceptional heart care, we have created designated Provider Care Teams.  These Care Teams include your primary Cardiologist (physician) and Advanced Practice  Providers (APPs -  Physician Assistants and Nurse Practitioners) who all work together to provide you with the care you need, when you need it.  We recommend signing up for the patient portal called "MyChart".  Sign up information is provided on this After Visit Summary.  MyChart is used to connect with patients for Virtual Visits (Telemedicine).  Patients are able to view lab/test results, encounter notes, upcoming appointments, etc.  Non-urgent messages can be sent to your provider as well.   To learn more about what you can do with MyChart, go to [email or web address].    Your next appointment:   12 month(s)  Provider:   Charlton Haws, MD or Robin Searing, NP   Other Instructions

## 2023-05-14 ENCOUNTER — Telehealth: Payer: Self-pay

## 2023-05-14 NOTE — Telephone Encounter (Signed)
**Note De-Identified Tessa Seaberry Obfuscation** Ordering provider: Robin Searing, NP Associated diagnoses: Snoring-R06.83, Fatigue-R53.83 and Somnolence-R40.0  WatchPAT PA obtained on 05/14/2023 by Rosario Duey, Lorelle Formosa, LPN. Authorization: Per the [facility] Provider Portal: Prior Authorization/Notification is not required for the requested service(s)-CPT Code 16109 (Itamar-HST). Decision ID #: [id number]  Patient notified of PIN (1234) on 05/14/2023 Divante Kotch Notification Method: MyChart message.  Phone note routed to covering staff for follow-up.

## 2023-05-15 ENCOUNTER — Encounter (INDEPENDENT_AMBULATORY_CARE_PROVIDER_SITE_OTHER): Admitting: Cardiology

## 2023-05-15 DIAGNOSIS — G4733 Obstructive sleep apnea (adult) (pediatric): Secondary | ICD-10-CM

## 2023-05-17 NOTE — Telephone Encounter (Signed)
 Called the patient to remind him to complete Itamar and he states he completed the study two nights ago. Apologized to the patient for the inconvenience.

## 2023-05-21 ENCOUNTER — Ambulatory Visit: Attending: Nurse Practitioner

## 2023-05-21 DIAGNOSIS — G43009 Migraine without aura, not intractable, without status migrainosus: Secondary | ICD-10-CM

## 2023-05-21 DIAGNOSIS — R55 Syncope and collapse: Secondary | ICD-10-CM

## 2023-05-21 DIAGNOSIS — I639 Cerebral infarction, unspecified: Secondary | ICD-10-CM

## 2023-05-21 NOTE — Procedures (Signed)
   SLEEP STUDY REPORT Patient Information Study Date: 05/15/2023 Patient Name: Ian Golden Patient ID: [id number] Birth Date: Mar 02, 1965 Age: 58 Gender: Male BMI: 26.3 (W=198 lb, H=6' 1'') Stopbang: 6 Referring Physician: Robin Searing, NP  TEST DESCRIPTION: Home sleep apnea testing was completed using the WatchPat, a Type 1 device, utilizing peripheral arterial tonometry (PAT), chest movement, actigraphy, pulse oximetry, pulse rate, body position and snore. AHI was calculated with apnea and hypopnea using valid sleep time as the denominator. RDI includes apneas, hypopneas, and RERAs. The data acquired and the scoring of sleep and all associated events were performed in accordance with the recommended standards and specifications as outlined in the AASM Manual for the Scoring of Sleep and Associated Events 2.2.0 (2015).  FINDINGS:  1. Mild Obstructive Sleep Apnea with AHI 7.3/hr. Most events occurred during supine sleep.  2. No Central Sleep Apnea with pAHIc 0.9/hr.  3. Oxygen desaturations as low as 89%.  4. Moderate snoring was present. O2 sats were < 88% for 0 min.  5. Total sleep time was 5 hrs and 29 min.  6. 29.1% of total sleep time was spent in REM sleep.  7. Shortened sleep onset latency at 6 min.  8. Prolonged REM sleep onset latency at 135 min.  9. Total awakenings were 19. 10. Arrhythmia detection: None  DIAGNOSIS: Mild Obstructive Sleep Apnea (G47.33)  RECOMMENDATIONS: 1. Clinical correlation of these findings is necessary. The decision to treat obstructive sleep apnea (OSA) is usually based on the presence of apnea symptoms or the presence of associated medical conditions such as Hypertension, Congestive Heart Failure, Atrial Fibrillation or Obesity. The most common symptoms of OSA are snoring, gasping for breath while sleeping, daytime sleepiness and fatigue.  2. Initiating apnea therapy is recommended given the presence of symptoms and/or associated  conditions. Recommend proceeding with one of the following:   a. Auto-CPAP therapy with a pressure range of 5-20cm H2O.   b. An oral appliance (OA) that can be obtained from certain dentists with expertise in sleep medicine. These are primarily of use in non-obese patients with mild and moderate disease.   c. An ENT consultation which may be useful to look for specific causes of obstruction and possible treatment options.   d. If patient is intolerant to PAP therapy, consider referral to ENT for evaluation for hypoglossal nerve stimulator.  3. Close follow-up is necessary to ensure success with CPAP or oral appliance therapy for maximum benefit .  4. A follow-up oximetry study on CPAP is recommended to assess the adequacy of therapy and determine the need for supplemental oxygen or the potential need for Bi-level therapy. An arterial blood gas to determine the adequacy of baseline ventilation and oxygenation should also be considered.  5. Healthy sleep recommendations include: adequate nightly sleep (normal 7-9 hrs/night), avoidance of caffeine after noon and alcohol near bedtime, and maintaining a sleep environment that is cool, dark and quiet.  6. Weight loss for overweight patients is recommended. Even modest amounts of weight loss can significantly improve the severity of sleep apnea.  7. Snoring recommendations include: weight loss where appropriate, side sleeping, and avoidance of alcohol before bed.  8. Operation of motor vehicle should be avoided when sleepy.  Signature: Armanda Magic, MD; [facility]; Diplomat, American Board of Sleep Medicine Electronically Signed: 05/21/2023 5:33:16 PM

## 2023-05-22 ENCOUNTER — Telehealth: Payer: Self-pay

## 2023-05-22 NOTE — Telephone Encounter (Signed)
Notified patient of sleep study results and recommendations. All questions (if any) were answered. Patient verbalized understanding.

## 2023-05-22 NOTE — Telephone Encounter (Signed)
-----   Message from Armanda Magic sent at 05/21/2023  5:43 PM EDT ----- Patient has very mild OSA - set up OV to discuss treatment options.

## 2023-05-23 ENCOUNTER — Ambulatory Visit (INDEPENDENT_AMBULATORY_CARE_PROVIDER_SITE_OTHER): Payer: 59

## 2023-05-23 DIAGNOSIS — R55 Syncope and collapse: Secondary | ICD-10-CM

## 2023-05-23 LAB — CUP PACEART REMOTE DEVICE CHECK
Date Time Interrogation Session: 20250402230851
Implantable Pulse Generator Implant Date: 20230404

## 2023-05-23 NOTE — Addendum Note (Signed)
 Addended by: Geralyn Flash D on: 05/23/2023 11:35 AM   Modules accepted: Orders

## 2023-05-23 NOTE — Progress Notes (Signed)
 [facility]link Summary Report / Loop Recorder

## 2023-05-27 ENCOUNTER — Telehealth: Payer: Self-pay | Admitting: *Deleted

## 2023-05-27 DIAGNOSIS — Z79899 Other long term (current) drug therapy: Secondary | ICD-10-CM

## 2023-05-27 DIAGNOSIS — E782 Mixed hyperlipidemia: Secondary | ICD-10-CM

## 2023-05-27 DIAGNOSIS — Z131 Encounter for screening for diabetes mellitus: Secondary | ICD-10-CM

## 2023-05-27 DIAGNOSIS — Z125 Encounter for screening for malignant neoplasm of prostate: Secondary | ICD-10-CM

## 2023-05-27 NOTE — Telephone Encounter (Signed)
 Please call and schedule fasting lab appointment prior to his CPE on 06/12/2023.  Future orders in Epic.

## 2023-05-27 NOTE — Telephone Encounter (Signed)
LVM for patient to c/b and schedule labs.

## 2023-05-27 NOTE — Telephone Encounter (Signed)
 Copied from CRM [id number]. Topic: Clinical - Request for Lab/Test Order >> May 27, 2023  9:22 AM Bobbye Morton wrote: Reason for CRM: PT called in to get lab work order for appt coming up on 04/23. Pt would like a call to schedule when lab work order is ready   Pt callback [phone]

## 2023-05-28 ENCOUNTER — Other Ambulatory Visit (INDEPENDENT_AMBULATORY_CARE_PROVIDER_SITE_OTHER)

## 2023-05-28 DIAGNOSIS — E782 Mixed hyperlipidemia: Secondary | ICD-10-CM | POA: Diagnosis not present

## 2023-05-28 DIAGNOSIS — Z79899 Other long term (current) drug therapy: Secondary | ICD-10-CM

## 2023-05-28 DIAGNOSIS — Z131 Encounter for screening for diabetes mellitus: Secondary | ICD-10-CM

## 2023-05-28 DIAGNOSIS — Z125 Encounter for screening for malignant neoplasm of prostate: Secondary | ICD-10-CM

## 2023-05-28 LAB — LIPID PANEL
Cholesterol: 140 mg/dL (ref 0–200)
HDL: 39.4 mg/dL (ref >39.00)
LDL Cholesterol: 81 mg/dL (ref 0–99)
NonHDL: 100.49
Total CHOL/HDL Ratio: 4
Triglycerides: 99 mg/dL (ref 0.0–149.0)
VLDL: 19.8 mg/dL (ref 0.0–40.0)

## 2023-05-28 LAB — CBC WITH DIFFERENTIAL/PLATELET
Basophils Absolute: 0 10*3/uL (ref 0.0–0.1)
Basophils Relative: 0.8 % (ref 0.0–3.0)
Eosinophils Absolute: 0.6 10*3/uL (ref 0.0–0.7)
Eosinophils Relative: 10.2 % — ABNORMAL HIGH (ref 0.0–5.0)
HCT: 45.6 % (ref 39.0–52.0)
Hemoglobin: 15.6 g/dL (ref 13.0–17.0)
Lymphocytes Relative: 26.9 % (ref 12.0–46.0)
Lymphs Abs: 1.5 10*3/uL (ref 0.7–4.0)
MCHC: 34.1 g/dL (ref 30.0–36.0)
MCV: 89 fl (ref 78.0–100.0)
Monocytes Absolute: 0.4 10*3/uL (ref 0.1–1.0)
Monocytes Relative: 7.7 % (ref 3.0–12.0)
Neutro Abs: 3.1 10*3/uL (ref 1.4–7.7)
Neutrophils Relative %: 54.4 % (ref 43.0–77.0)
Platelets: 165 10*3/uL (ref 150.0–400.0)
RBC: 5.12 Mil/uL (ref 4.22–5.81)
RDW: 12.9 % (ref 11.5–15.5)
WBC: 5.7 10*3/uL (ref 4.0–10.5)

## 2023-05-28 LAB — HEPATIC FUNCTION PANEL
ALT: 25 U/L (ref 0–53)
AST: 22 U/L (ref 0–37)
Albumin: 4.3 g/dL (ref 3.5–5.2)
Alkaline Phosphatase: 100 U/L (ref 39–117)
Bilirubin, Direct: 0.1 mg/dL (ref 0.0–0.3)
Total Bilirubin: 0.5 mg/dL (ref 0.2–1.2)
Total Protein: 7.1 g/dL (ref 6.0–8.3)

## 2023-05-28 LAB — BASIC METABOLIC PANEL WITH GFR
BUN: 18 mg/dL (ref 6–23)
CO2: 28 meq/L (ref 19–32)
Calcium: 9.4 mg/dL (ref 8.4–10.5)
Chloride: 106 meq/L (ref 96–112)
Creatinine, Ser: 1.06 mg/dL (ref 0.40–1.50)
GFR: 77.28 mL/min (ref >60.00)
Glucose, Bld: 99 mg/dL (ref 70–99)
Potassium: 4.2 meq/L (ref 3.5–5.1)
Sodium: 141 meq/L (ref 135–145)

## 2023-05-28 LAB — HEMOGLOBIN A1C: Hgb A1c MFr Bld: 5.9 % (ref 4.6–6.5)

## 2023-05-28 NOTE — Telephone Encounter (Signed)
 Patient scheduled.

## 2023-05-29 ENCOUNTER — Inpatient Hospital Stay: Admitting: Family Medicine

## 2023-05-29 LAB — PSA, TOTAL WITH REFLEX TO PSA, FREE: PSA, Total: 0.6 ng/mL (ref <=4.0)

## 2023-05-30 ENCOUNTER — Other Ambulatory Visit: Payer: Self-pay

## 2023-05-30 ENCOUNTER — Encounter: Payer: Self-pay | Admitting: Cardiology

## 2023-05-30 ENCOUNTER — Ambulatory Visit: Attending: Cardiology | Admitting: Cardiology

## 2023-05-30 VITALS — BP 134/84 | HR 67 | Ht 74.0 in | Wt 201.4 lb

## 2023-05-30 DIAGNOSIS — G4733 Obstructive sleep apnea (adult) (pediatric): Secondary | ICD-10-CM

## 2023-05-30 DIAGNOSIS — R55 Syncope and collapse: Secondary | ICD-10-CM

## 2023-05-30 DIAGNOSIS — G479 Sleep disorder, unspecified: Secondary | ICD-10-CM

## 2023-05-30 DIAGNOSIS — G43009 Migraine without aura, not intractable, without status migrainosus: Secondary | ICD-10-CM

## 2023-05-30 DIAGNOSIS — I639 Cerebral infarction, unspecified: Secondary | ICD-10-CM

## 2023-05-30 DIAGNOSIS — I351 Nonrheumatic aortic (valve) insufficiency: Secondary | ICD-10-CM

## 2023-05-30 NOTE — Progress Notes (Signed)
 Order for ResMed auto CPAP from 4 to 15cm H2O with heated humidity and mask of choice sent to [facility] today.

## 2023-05-30 NOTE — Progress Notes (Signed)
 Sleep Medicine CONSULT Note    Date:  05/30/2023   ID:  Ian Golden, DOB January 18, 1966, MRN [id number]  PCP:  Hannah Beat, MD  Cardiologist: None   Chief Complaint  Patient presents with   New Patient (Initial Visit)    OSA    History of Present Illness:  Ian Golden is a 58 y.o. male who is being seen today for the evaluation of OSA at the request of Robin Searing, MD.  This is a 58yo  male with a hx of allergies and hx of mild OSA a few years ago with AHI 9/hr and no treatment was recommended.  He has had problems with excessive daytime sleepiness for years but when seen by Neurology about his sleepiness they felt the low degree of OSA was likely not causing his sx.  He was seen by Robin Searing, NP and continue to complain of daytime sleepiness.  Stop Bang score was 4. He will have to take a nap some during the day.  His wife says that his snoring is terrible and his wife can no longer sleep with him.  He underwent HST for excessive daytime sleepiness which showed mild OSA with an AHI of 7.3/hr but most episodes occurred during supine sleep.  Past Medical History:  Diagnosis Date   Allergic rhinitis, cause unspecified    Hemorrhoids    Migraine    Ulcer     Past Surgical History:  Procedure Laterality Date   BUBBLE STUDY  05/23/2021   Procedure: BUBBLE STUDY;  Surgeon: Wendall Stade, MD;  Location: [facility] ENDOSCOPY;  Service: Cardiovascular;;   LOOP RECORDER INSERTION N/A 05/23/2021   Procedure: LOOP RECORDER INSERTION;  Surgeon: Regan Lemming, MD;  Location: [facility] INVASIVE CV LAB;  Service: Cardiovascular;  Laterality: N/A;   TEE WITHOUT CARDIOVERSION N/A 05/23/2021   Procedure: TRANSESOPHAGEAL ECHOCARDIOGRAM (TEE);  Surgeon: Wendall Stade, MD;  Location: [facility] ENDOSCOPY;  Service: Cardiovascular;  Laterality: N/A;   WISDOM TOOTH EXTRACTION     with sedation    Current Medications: Current Meds  Medication Sig   Ascorbic Acid (VITAMIN C PO) Take 1 tablet by mouth daily.    aspirin EC 81 MG tablet Take 1 tablet (81 mg total) by mouth daily. Swallow whole.   atorvastatin (LIPITOR) 80 MG tablet TAKE 1 TABLET (80 MG TOTAL) BY MOUTH DAILY.   fluoruracil (CARAC) 0.5 % cream Apply topically as directed.   ondansetron (ZOFRAN-ODT) 4 MG disintegrating tablet Take 1 tablet (4 mg total) by mouth every 8 (eight) hours as needed for nausea or vomiting.   pantoprazole (PROTONIX) 40 MG tablet Take 1 tablet (40 mg total) by mouth daily.   SUMAtriptan (IMITREX) 6 MG/0.5ML SOSY injection Inject 0.5 mLs (6 mg total) into the skin every 2 (two) hours as needed for migraine or headache. Max 2 doses/24 hours    Allergies:   Patient has no known allergies.   Social History   Socioeconomic History   Marital status: Married    Spouse name: Stanton Kidney   Number of children: 3   Years of education: Not on file   Highest education level: Not on file  Occupational History    Employer: [employer]    Comment: Patent examiner  Tobacco Use   Smoking status: Never   Smokeless tobacco: Never  Substance and Sexual Activity   Alcohol use: Never    Alcohol/week: 0.0 standard drinks of alcohol   Drug use: Never   Sexual activity:  Yes    Partners: Female  Other Topics Concern   Not on file  Social History Narrative   Married, 3 adopted children   Hydrographic surveyor   Regular exercise- no   Right handed   Social Drivers of Corporate investment banker Strain: Not on file  Food Insecurity: No Food Insecurity (04/24/2023)   Hunger Vital Sign    Worried About Running Out of Food in the Last Year: Never true    Ran Out of Food in the Last Year: Never true  Transportation Needs: No Transportation Needs (04/24/2023)   PRAPARE - Administrator, Civil Service (Medical): No    Lack of Transportation (Non-Medical): No  Physical Activity: Not on file  Stress: Not on file  Social Connections: Not on file     Family History:  The patient's family history includes Colon  polyps in his father; Heart attack in his mother; Heart disease in his mother; Other in his mother.   ROS:   Please see the history of present illness.    ROS All other systems reviewed and are negative.      No data to display             PHYSICAL EXAM:   VS:  BP 134/84   Pulse 67   Ht 6\' 2"  (1.88 m)   Wt 201 lb 6.4 oz (91.4 kg)   SpO2 98%   BMI 25.86 kg/m    GEN: Well nourished, well developed, in no acute distress  HEENT: normal  Neck: no JVD, carotid bruits, or masses Cardiac: RRR; no murmurs, rubs, or gallops,no edema.  Intact distal pulses bilaterally.  Respiratory:  clear to auscultation bilaterally, normal work of breathing GI: soft, nontender, nondistended, + BS MS: no deformity or atrophy  Skin: warm and dry, no rash Neuro:  Alert and Oriented x 3, Strength and sensation are intact Psych: euthymic mood, full affect  Wt Readings from Last 3 Encounters:  05/30/23 201 lb 6.4 oz (91.4 kg)  05/09/23 199 lb 9.6 oz (90.5 kg)  04/23/23 195 lb (88.5 kg)      Studies/Labs Reviewed:   HST  Recent Labs: 04/24/2023: TSH 0.892 05/28/2023: ALT 25; BUN 18; Creatinine, Ser 1.06; Hemoglobin 15.6; Platelets 165.0; Potassium 4.2; Sodium 141    ASSESSMENT:    1. OSA (obstructive sleep apnea)      PLAN:  In order of problems listed above:  OSA  -HST was done for excessive daytime sleepiness with StopBang score of 6 -he has very mild OSA with an AHI of 7/hr and mainly occurs during supine sleep. -we discussed treatment options including referral to ENT to rule out surgical causes of OSA, an oral appliance or CPAP therapy. -he has had 2 CVAs in the past and since then has been sleepier that normal so difficult to assess whether sleepiness is a result of the strokes or from the very mild OSA. -due to how bad his daytime sleepiness is, recommend a trial of auto CPAP from 4 to 15cm H2O with heated humidity and mask of choice.  -Followup with me in 6 weeks after he gets  his device  Time Spent: 20 minutes total time of encounter, including 15 minutes spent in face-to-face patient care on the date of this encounter. This time includes coordination of care and counseling regarding above mentioned problem list. Remainder of non-face-to-face time involved reviewing chart documents/testing relevant to the patient encounter and documentation in the medical record. I  have independently reviewed documentation from referring provider  Medication Adjustments/Labs and Tests Ordered: Current medicines are reviewed at length with the patient today.  Concerns regarding medicines are outlined above.  Medication changes, Labs and Tests ordered today are listed in the Patient Instructions below.  There are no Patient Instructions on file for this visit.   Signed, Armanda Magic, MD  05/30/2023 9:40 AM    [facility] [address] [place], [place], [place]  [address] Phone: [phone]; Fax: [phone]

## 2023-05-30 NOTE — Patient Instructions (Signed)
 Medication Instructions:   *If you need a refill on your cardiac medications before your next appointment, please call your pharmacy*  Lab Work:  If you have labs (blood work) drawn today and your tests are completely normal, you will receive your results only by: MyChart Message (if you have MyChart) OR A paper copy in the mail If you have any lab test that is abnormal or we need to change your treatment, we will call you to review the results.  Testing/Procedures:   Follow-Up: At [facility], you and your health needs are our priority.  As part of our continuing mission to provide you with exceptional heart care, our providers are all part of one team.  This team includes your primary Cardiologist (physician) and Advanced Practice Providers or APPs (Physician Assistants and Nurse Practitioners) who all work together to provide you with the care you need, when you need it.  We recommend signing up for the patient portal called "MyChart".  Sign up information is provided on this After Visit Summary.  MyChart is used to connect with patients for Virtual Visits (Telemedicine).  Patients are able to view lab/test results, encounter notes, upcoming appointments, etc.  Non-urgent messages can be sent to your provider as well.   To learn more about what you can do with MyChart, go to [email or web address].   Other Instructions       1st Floor: - Lobby - Registration  - Pharmacy  - Lab - Cafe  2nd Floor: - PV Lab - Diagnostic Testing (echo, CT, nuclear med)  3rd Floor: - Vacant  4th Floor: - TCTS (cardiothoracic surgery) - AFib Clinic - Structural Heart Clinic - Vascular Surgery  - Vascular Ultrasound  5th Floor: - HeartCare Cardiology (general and EP) - Clinical Pharmacy for coumadin, hypertension, lipid, weight-loss medications, and med management appointments    Valet parking services will be available as well.

## 2023-05-30 NOTE — Telephone Encounter (Signed)
 Discussed with Dr. Mayford Knife.  Called and spoke to pt.  Made aware his regurgitation is trivial and that we would not do anything for it at this time, but monitor with echocardiograms. Aware he will have a repeat echo in a year, along with establishing with Dr Eden Emms after that.  Patient verbalized understanding and agreeable to plan.

## 2023-05-31 ENCOUNTER — Encounter: Payer: Self-pay | Admitting: Family Medicine

## 2023-06-03 NOTE — Telephone Encounter (Signed)
Printed and placed in provider box for review

## 2023-06-11 NOTE — Progress Notes (Unsigned)
     Adylene Dlugosz T. Myeshia Fojtik, MD, CAQ Sports Medicine [facility] [address] [place], [address]  Phone: [phone]  FAX: [phone]  IVERY NANNEY - 58 y.o. male  MRN [id number]  Date of Birth: [birth year]  Date: 06/12/2023  PCP: Scherrie Curt, MD  Referral: Scherrie Curt, MD  No chief complaint on file.  Subjective:   SHAFIN POLLIO is a 58 y.o. very pleasant male patient with There is no height or weight on file to calculate BMI. who presents with the following:  The patient is here for hospital follow-up.  Admit date:     04/23/2023  Discharge date: 04/25/23  It looks like at discharge they had intended a 1 week hospital follow-up, and this was not done until today.  He did have an episode of syncope.  He had 2 episodes of syncope.  Of note he does have history of cryptogenic stroke.  Cardiology was consulted, they felt that this was like a vasovagal event.  MRI was negative for new stroke. D-dimer and troponins were normal Heart echo was normal    Review of Systems is noted in the HPI, as appropriate  Objective:   There were no vitals taken for this visit.  GEN: No acute distress; alert,appropriate. PULM: Breathing comfortably in no respiratory distress PSYCH: Normally interactive.   Laboratory and Imaging Data:  Assessment and Plan:   ***

## 2023-06-12 ENCOUNTER — Encounter: Payer: Self-pay | Admitting: Family Medicine

## 2023-06-12 ENCOUNTER — Ambulatory Visit (INDEPENDENT_AMBULATORY_CARE_PROVIDER_SITE_OTHER): Admitting: Family Medicine

## 2023-06-12 VITALS — BP 120/70 | HR 67 | Temp 97.1°F | Ht 73.25 in | Wt 196.0 lb

## 2023-06-12 DIAGNOSIS — Z0000 Encounter for general adult medical examination without abnormal findings: Secondary | ICD-10-CM | POA: Diagnosis not present

## 2023-06-12 DIAGNOSIS — R7303 Prediabetes: Secondary | ICD-10-CM

## 2023-06-12 DIAGNOSIS — Z23 Encounter for immunization: Secondary | ICD-10-CM

## 2023-06-12 DIAGNOSIS — Z8673 Personal history of transient ischemic attack (TIA), and cerebral infarction without residual deficits: Secondary | ICD-10-CM | POA: Diagnosis not present

## 2023-06-12 DIAGNOSIS — R55 Syncope and collapse: Secondary | ICD-10-CM

## 2023-06-13 ENCOUNTER — Encounter: Payer: Self-pay | Admitting: Family Medicine

## 2023-06-18 ENCOUNTER — Other Ambulatory Visit: Payer: Self-pay | Admitting: Family Medicine

## 2023-06-19 ENCOUNTER — Encounter: Payer: Self-pay | Admitting: Family Medicine

## 2023-06-24 ENCOUNTER — Ambulatory Visit: Payer: 59

## 2023-06-24 DIAGNOSIS — I639 Cerebral infarction, unspecified: Secondary | ICD-10-CM

## 2023-06-24 LAB — CUP PACEART REMOTE DEVICE CHECK
Date Time Interrogation Session: 20250504231517
Implantable Pulse Generator Implant Date: 20230404

## 2023-07-01 NOTE — Progress Notes (Signed)
 [facility]link Summary Report / Loop Recorder

## 2023-07-25 ENCOUNTER — Ambulatory Visit (INDEPENDENT_AMBULATORY_CARE_PROVIDER_SITE_OTHER): Payer: 59

## 2023-07-25 DIAGNOSIS — I639 Cerebral infarction, unspecified: Secondary | ICD-10-CM

## 2023-07-25 LAB — CUP PACEART REMOTE DEVICE CHECK
Date Time Interrogation Session: 20250604231530
Implantable Pulse Generator Implant Date: 20230404

## 2023-08-05 ENCOUNTER — Ambulatory Visit: Payer: Self-pay | Admitting: Cardiology

## 2023-08-12 NOTE — Progress Notes (Signed)
 [facility]link Summary Report / Loop Recorder

## 2023-08-26 ENCOUNTER — Ambulatory Visit

## 2023-08-26 DIAGNOSIS — I639 Cerebral infarction, unspecified: Secondary | ICD-10-CM | POA: Diagnosis not present

## 2023-08-27 LAB — CUP PACEART REMOTE DEVICE CHECK
Date Time Interrogation Session: 20250706231751
Implantable Pulse Generator Implant Date: 20230404

## 2023-08-29 ENCOUNTER — Encounter

## 2023-08-29 ENCOUNTER — Ambulatory Visit: Attending: Cardiology | Admitting: Cardiology

## 2023-08-29 VITALS — Ht 73.0 in | Wt 195.0 lb

## 2023-08-29 DIAGNOSIS — G4733 Obstructive sleep apnea (adult) (pediatric): Secondary | ICD-10-CM

## 2023-08-29 NOTE — Progress Notes (Signed)
 SLEEP MEDICINE VIRTUAL VISIT via Video Note   Because of Ian Golden co-morbid illnesses, he is at least at moderate risk for complications without adequate follow up.  This format is felt to be most appropriate for this patient at this time.  All issues noted in this document were discussed and addressed.  A limited physical exam was performed with this format.  Please refer to the patient's chart for his consent to telehealth for [facility].    Date:  08/29/2023   ID:  Ian Golden, DOB 02/02/1966, MRN [id number] The patient was identified using 2 identifiers.  Patient Location: Home Provider Location: Office/Clinic   PCP:  Watt Mirza, MD   [facility] Providers Cardiologist:  None     Evaluation Performed:  Follow-Up Visit  Chief Complaint:  OSA  History of Present Illness:    Ian Golden is a 58 y.o. male with a hx of allergies and hx of mild OSA a few years ago with AHI 9/hr and no treatment was recommended.  He has had problems with excessive daytime sleepiness for years but when seen by Neurology about his sleepiness they felt the low degree of OSA was likely not causing his sx.  He was seen by Jackee Alberts, NP and continued to complain of daytime sleepiness.  Stop Bang score was 4. He underwent HST for excessive daytime sleepiness which showed mild OSA with an AHI of 7.3/hr but most episodes occurred during supine sleep.  At last office visit it was decided that we would start him on auto CPAP 4 to 15 cm H2O.  He is now back for follow-up  He is doing well with his PAP device and thinks that he has gotten used to it. Recently he had to go to [place], [place] to see his Father who is in Hospice care.  He had been up a lot at night with his Father so he has not been able to be compliant with his device over that time period.  He tolerates the full face mask for the most part but sometimes feels claustrophobic.  He feels the pressure is adequate for the  most part although a few times he has felt like he could not get enough air.  Since going on PAP he does not feel as tired.  He denies any significant mouth or nasal dryness or nasal congestion.     Past Medical History:  Diagnosis Date   Allergic rhinitis, cause unspecified    Hemorrhoids    History of ischemic stroke 04/24/2023   Hyperlipidemia 05/21/2021   Migraine    Ulcer    Past Surgical History:  Procedure Laterality Date   BUBBLE STUDY  05/23/2021   Procedure: BUBBLE STUDY;  Surgeon: Delford Maude BROCKS, MD;  Location: [facility] ENDOSCOPY;  Service: Cardiovascular;;   LOOP RECORDER INSERTION N/A 05/23/2021   Procedure: LOOP RECORDER INSERTION;  Surgeon: Inocencio Soyla Lunger, MD;  Location: [facility] INVASIVE CV LAB;  Service: Cardiovascular;  Laterality: N/A;   TEE WITHOUT CARDIOVERSION N/A 05/23/2021   Procedure: TRANSESOPHAGEAL ECHOCARDIOGRAM (TEE);  Surgeon: Delford Maude BROCKS, MD;  Location: [facility] ENDOSCOPY;  Service: Cardiovascular;  Laterality: N/A;   WISDOM TOOTH EXTRACTION     with sedation     Current Meds  Medication Sig   Ascorbic Acid (VITAMIN C PO) Take 1 tablet by mouth daily.   aspirin  EC 81 MG tablet Take 1 tablet (81 mg total) by mouth daily. Swallow whole.  atorvastatin  (LIPITOR ) 80 MG tablet TAKE 1 TABLET (80 MG TOTAL) BY MOUTH DAILY.   fluoruracil (CARAC) 0.5 % cream Apply topically as directed.   ondansetron  (ZOFRAN -ODT) 4 MG disintegrating tablet Take 1 tablet (4 mg total) by mouth every 8 (eight) hours as needed for nausea or vomiting.   pantoprazole  (PROTONIX ) 40 MG tablet TAKE 1 TABLET BY MOUTH DAILY.   SUMAtriptan  (IMITREX ) 6 MG/0.5ML SOSY injection Inject 0.5 mLs (6 mg total) into the skin every 2 (two) hours as needed for migraine or headache. Max 2 doses/24 hours     Allergies:   Patient has no known allergies.   Social History   Tobacco Use   Smoking status: Never   Smokeless tobacco: Never  Substance Use Topics   Alcohol use: Never    Alcohol/week: 0.0 standard  drinks of alcohol   Drug use: Never     Family Hx: The patient's family history includes Colon polyps in his father; Heart attack in his mother; Heart disease in his mother; Other in his mother. There is no history of Colon cancer, Esophageal cancer, Rectal cancer, or Stomach cancer.  ROS:   Please see the history of present illness.     All other systems reviewed and are negative.   Prior Sleep studies:   The following studies were reviewed today:  Home sleep study and PAP compliance download  Labs/Other Tests and Data Reviewed:     Recent Labs: 04/24/2023: TSH 0.892 05/28/2023: ALT 25; BUN 18; Creatinine, Ser 1.06; Hemoglobin 15.6; Platelets 165.0; Potassium 4.2; Sodium 141   Wt Readings from Last 3 Encounters:  08/29/23 195 lb (88.5 kg)  06/12/23 196 lb (88.9 kg)  05/30/23 201 lb 6.4 oz (91.4 kg)     Risk Assessment/Calculations:      Objective:    Vital Signs:  Ht 6' 1 (1.854 m)   Wt 195 lb (88.5 kg)   BMI 25.73 kg/m    VITAL SIGNS:  reviewed GEN:  no acute distress EYES:  sclerae anicteric, EOMI - Extraocular Movements Intact RESPIRATORY:  normal respiratory effort, symmetric expansion CARDIOVASCULAR:  no peripheral edema SKIN:  no rash, lesions or ulcers. MUSCULOSKELETAL:  no obvious deformities. NEURO:  alert and oriented x 3, no obvious focal deficit PSYCH:  normal affect  ASSESSMENT & PLAN:    OSA - The patient is tolerating PAP therapy well without any problems. The PAP download performed by his DME was personally reviewed and interpreted by me today and showed an AHI of 2.5 /hr on auto CPAP from 4-15 cm H2O with 67% compliance in using more than 4 hours nightly.  The patient has been using and benefiting from PAP use and will continue to benefit from therapy.  -His compliance has been down some due to taking care of his father who is in Hospice and he has to be up some during the night. -he has had a few instances where he feels he is not getting enough  air when first going to sleep>>I will increase auto CPAP to 6-15cm H2O    Total time of encounter: 15 minutes total time of encounter, including 10 minutes spent in face-to-face patient care on the date of this encounter. This time includes coordination of care and counseling regarding above mentioned problem list. Remainder of non-face-to-face time involved reviewing chart documents/testing relevant to the patient encounter and documentation in the medical record. I have independently reviewed documentation from referring provider.    Medication Adjustments/Labs and Tests Ordered: Current medicines are  reviewed at length with the patient today.  Concerns regarding medicines are outlined above.   Tests Ordered: No orders of the defined types were placed in this encounter.   Medication Changes: No orders of the defined types were placed in this encounter.   Follow Up:  In Person in 1 year(s)  Signed, Wilbert Bihari, MD  08/29/2023 8:42 AM    [facility]

## 2023-08-29 NOTE — Patient Instructions (Signed)

## 2023-08-30 ENCOUNTER — Telehealth: Payer: Self-pay | Admitting: *Deleted

## 2023-08-30 DIAGNOSIS — G4733 Obstructive sleep apnea (adult) (pediatric): Secondary | ICD-10-CM

## 2023-08-30 DIAGNOSIS — R55 Syncope and collapse: Secondary | ICD-10-CM

## 2023-08-30 NOTE — Telephone Encounter (Signed)
-----   Message from Wilbert Bihari sent at 08/29/2023  8:44 AM EDT ----- increase auto CPAP to 6-15cm H2O

## 2023-08-30 NOTE — Telephone Encounter (Signed)
Order placed to [facility] via community message

## 2023-09-10 NOTE — Progress Notes (Signed)
 [facility]link Summary Report / Loop Recorder

## 2023-09-20 ENCOUNTER — Other Ambulatory Visit: Payer: Self-pay | Admitting: Family Medicine

## 2023-09-21 ENCOUNTER — Ambulatory Visit: Payer: Self-pay | Admitting: Cardiology

## 2023-09-26 ENCOUNTER — Ambulatory Visit

## 2023-09-26 ENCOUNTER — Ambulatory Visit: Payer: Self-pay | Admitting: Cardiology

## 2023-09-26 DIAGNOSIS — I639 Cerebral infarction, unspecified: Secondary | ICD-10-CM | POA: Diagnosis not present

## 2023-09-26 LAB — CUP PACEART REMOTE DEVICE CHECK
Date Time Interrogation Session: 20250806231526
Implantable Pulse Generator Implant Date: 20230404

## 2023-10-03 ENCOUNTER — Encounter

## 2023-10-18 ENCOUNTER — Other Ambulatory Visit: Payer: Self-pay | Admitting: Family Medicine

## 2023-10-22 ENCOUNTER — Encounter: Payer: Self-pay | Admitting: Podiatry

## 2023-10-22 ENCOUNTER — Ambulatory Visit: Admitting: Podiatry

## 2023-10-22 DIAGNOSIS — M7661 Achilles tendinitis, right leg: Secondary | ICD-10-CM

## 2023-10-22 DIAGNOSIS — M7662 Achilles tendinitis, left leg: Secondary | ICD-10-CM | POA: Diagnosis not present

## 2023-10-22 MED ORDER — METHYLPREDNISOLONE 4 MG PO TBPK
ORAL_TABLET | ORAL | 0 refills | Status: DC
Start: 2023-10-22 — End: 2023-12-30

## 2023-10-22 MED ORDER — MELOXICAM 15 MG PO TABS: 15.0000 mg | ORAL_TABLET | Freq: Every day | ORAL | 0 refills | Status: DC

## 2023-10-22 NOTE — Progress Notes (Signed)
  Subjective:  Patient ID: Ian Golden, male    DOB: Dec 16, 1965,   MRN: [id number]  Chief Complaint  Patient presents with   Tendonitis    I have achilles inflammation on my right foot that Dr. Magdalen has treated before.  Now I'm having problems with my left foot too.    58 y.o. male presents for concern of bilatearl heel pain that has been ongoing for a couple months. Has seen Dr. Magdalen for this is the past and had injections that helped. Relates the left heel has started acting up as well and been painful. Has done some stretching and had a steroid pack for another reason that did help some.  . Denies any other pedal complaints. Denies n/v/f/c.   Past Medical History:  Diagnosis Date   Allergic rhinitis, cause unspecified    Hemorrhoids    History of ischemic stroke 04/24/2023   Hyperlipidemia 05/21/2021   Migraine    Ulcer     Objective:  Physical Exam: Vascular: DP/PT pulses 2/4 bilateral. CFT <3 seconds. Normal hair growth on digits. No edema.  Skin. No lacerations or abrasions bilateral feet.  Musculoskeletal: MMT 5/5 bilateral lower extremities in DF, PF, Inversion and Eversion. Deceased ROM in DF of ankle joint. Tender on the right achilles tendon insertion. No pain proixmally Prominent bone spurring noted posterior. On the left tender more proximal in watershed area of tendon with palpable bulging of tendon in area. No defect noted. Negative thompson  Neurological: Sensation intact to light touch.   Assessment:   1. Tendonitis, Achilles, right   2. Tendonitis, Achilles, left      Plan:  Patient was evaluated and treated and all questions answered. -Xrays reviewed from previous. No acute fractures of dislocations.  -Discussed Achilles insertional tendonitis and treatment options with patient.  -Discussed stretching exercises. -Rx Meloxicam  provided and medrol  dose pack to be taken first.  -Heel lifts provided and discussed proper shoewear.  -Discussed if no  improvement will consider MRI/PT/EPAT/PRP injections.  -Patient to return to office in 6 weeks for recheck.    Ian Golden, DPM

## 2023-10-22 NOTE — Patient Instructions (Signed)

## 2023-10-28 ENCOUNTER — Ambulatory Visit

## 2023-10-28 DIAGNOSIS — I639 Cerebral infarction, unspecified: Secondary | ICD-10-CM

## 2023-10-28 LAB — CUP PACEART REMOTE DEVICE CHECK
Date Time Interrogation Session: 20250906231602
Implantable Pulse Generator Implant Date: 20230404

## 2023-10-29 ENCOUNTER — Ambulatory Visit: Payer: Self-pay | Admitting: Cardiology

## 2023-11-07 ENCOUNTER — Encounter

## 2023-11-07 NOTE — Progress Notes (Signed)
 Remote Loop Recorder Transmission

## 2023-11-16 NOTE — Progress Notes (Signed)
 Remote Loop Recorder Transmission

## 2023-11-27 ENCOUNTER — Ambulatory Visit (INDEPENDENT_AMBULATORY_CARE_PROVIDER_SITE_OTHER)

## 2023-11-27 DIAGNOSIS — I639 Cerebral infarction, unspecified: Secondary | ICD-10-CM

## 2023-11-28 ENCOUNTER — Encounter

## 2023-11-28 LAB — CUP PACEART REMOTE DEVICE CHECK
Date Time Interrogation Session: 20251007231305
Implantable Pulse Generator Implant Date: 20230404

## 2023-11-29 ENCOUNTER — Ambulatory Visit: Payer: Self-pay | Admitting: Cardiology

## 2023-12-02 NOTE — Progress Notes (Signed)
 Remote Loop Recorder Transmission

## 2023-12-03 ENCOUNTER — Encounter: Payer: Self-pay | Admitting: Podiatry

## 2023-12-03 ENCOUNTER — Ambulatory Visit (INDEPENDENT_AMBULATORY_CARE_PROVIDER_SITE_OTHER)

## 2023-12-03 ENCOUNTER — Ambulatory Visit: Admitting: Podiatry

## 2023-12-03 DIAGNOSIS — M7662 Achilles tendinitis, left leg: Secondary | ICD-10-CM

## 2023-12-03 DIAGNOSIS — Z23 Encounter for immunization: Secondary | ICD-10-CM | POA: Diagnosis not present

## 2023-12-03 DIAGNOSIS — M7661 Achilles tendinitis, right leg: Secondary | ICD-10-CM | POA: Diagnosis not present

## 2023-12-03 MED ORDER — MELOXICAM 15 MG PO TABS: 15.0000 mg | ORAL_TABLET | Freq: Every day | ORAL | 0 refills | Status: DC

## 2023-12-03 NOTE — Progress Notes (Signed)
  Subjective:  Patient ID: Ian Golden, male    DOB: 07/21/1965,   MRN: [id number]  Chief Complaint  Patient presents with   Tendonitis    It's a little bit better.  It comes and goes sometime.    58 y.o. male presents for follow-up of bilateral achilles tendonitis. Relates doing better but still coming and going. He has been stretching and taking meloxicam . . Denies any other pedal complaints. Denies n/v/f/c.   Past Medical History:  Diagnosis Date   Allergic rhinitis, cause unspecified    Hemorrhoids    History of ischemic stroke 04/24/2023   Hyperlipidemia 05/21/2021   Migraine    Ulcer     Objective:  Physical Exam: Vascular: DP/PT pulses 2/4 bilateral. CFT <3 seconds. Normal hair growth on digits. No edema.  Skin. No lacerations or abrasions bilateral feet.  Musculoskeletal: MMT 5/5 bilateral lower extremities in DF, PF, Inversion and Eversion. Deceased ROM in DF of ankle joint. Tender on the right achilles tendon insertion. No pain proixmally Prominent bone spurring noted posterior. On the left tender more proximal in watershed area of tendon with palpable bulging of tendon in area. No defect noted. Negative thompson  Neurological: Sensation intact to light touch.   Assessment:   1. Tendonitis, Achilles, right   2. Tendonitis, Achilles, left       Plan:  Patient was evaluated and treated and all questions answered. -Xrays reviewed from previous. No acute fractures of dislocations.  -Discussed Achilles insertional tendonitis and treatment options with patient.  -Continue stretching and heel lifts.  -Rx Meloxicam  refilled -Discussed if no improvement will consider MRI/PT/EPAT/PRP injections.  -Patient to return to office as needed   Asberry Failing, DPM

## 2023-12-12 ENCOUNTER — Encounter

## 2023-12-28 ENCOUNTER — Encounter

## 2023-12-29 ENCOUNTER — Ambulatory Visit

## 2023-12-29 DIAGNOSIS — I639 Cerebral infarction, unspecified: Secondary | ICD-10-CM

## 2023-12-29 LAB — CUP PACEART REMOTE DEVICE CHECK
Date Time Interrogation Session: 20251108230949
Implantable Pulse Generator Implant Date: 20230404

## 2023-12-30 ENCOUNTER — Other Ambulatory Visit: Payer: Self-pay | Admitting: Family Medicine

## 2023-12-31 ENCOUNTER — Ambulatory Visit: Payer: Self-pay | Admitting: Cardiology

## 2024-01-01 NOTE — Progress Notes (Signed)
 Remote Loop Recorder Transmission

## 2024-01-06 ENCOUNTER — Other Ambulatory Visit: Payer: Self-pay | Admitting: Podiatry

## 2024-01-28 ENCOUNTER — Encounter

## 2024-01-29 ENCOUNTER — Ambulatory Visit: Attending: Cardiology

## 2024-01-30 ENCOUNTER — Ambulatory Visit: Payer: Self-pay | Admitting: Cardiology

## 2024-01-30 LAB — CUP PACEART REMOTE DEVICE CHECK
Date Time Interrogation Session: 20251209231739
Implantable Pulse Generator Implant Date: 20230404

## 2024-02-05 NOTE — Progress Notes (Signed)
 Remote Loop Recorder Transmission

## 2024-02-11 ENCOUNTER — Ambulatory Visit: Payer: Self-pay | Admitting: Primary Care

## 2024-02-11 ENCOUNTER — Encounter: Payer: Self-pay | Admitting: Family Medicine

## 2024-02-11 ENCOUNTER — Ambulatory Visit: Admitting: Primary Care

## 2024-02-11 ENCOUNTER — Encounter: Payer: Self-pay | Admitting: Primary Care

## 2024-02-11 ENCOUNTER — Ambulatory Visit: Payer: Self-pay

## 2024-02-11 VITALS — BP 130/72 | HR 80 | Temp 98.0°F | Ht 73.25 in | Wt 196.2 lb

## 2024-02-11 DIAGNOSIS — R051 Acute cough: Secondary | ICD-10-CM

## 2024-02-11 DIAGNOSIS — J101 Influenza due to other identified influenza virus with other respiratory manifestations: Secondary | ICD-10-CM

## 2024-02-11 LAB — POCT INFLUENZA A/B
Influenza A, POC: POSITIVE — AB
Influenza B, POC: NEGATIVE

## 2024-02-11 LAB — POC COVID19 BINAXNOW: SARS Coronavirus 2 Ag: NEGATIVE — AB

## 2024-02-11 MED ORDER — OSELTAMIVIR PHOSPHATE 75 MG PO CAPS: 75.0000 mg | ORAL_CAPSULE | Freq: Two times a day (BID) | ORAL | 0 refills | Status: AC

## 2024-02-11 NOTE — Telephone Encounter (Signed)
 Noted, will evaluate.

## 2024-02-11 NOTE — Telephone Encounter (Signed)
 FYI Only or Action Required?: FYI only for provider: appointment scheduled on today.  Patient was last seen in primary care on 06/12/2023 by Watt Mirza, MD.  Called Nurse Triage reporting Influenza.  Symptoms began yesterday.  Interventions attempted: OTC medications: ibuprofen, Prescription medications: imitrex , and Rest, hydration, or home remedies.  Symptoms are: gradually worsening.  Triage Disposition: See Today in Office (overriding Call PCP Within 24 Hours)  Patient/caregiver understands and will follow disposition?: yes  Message from Mobeetie F sent at 02/11/2024 12:20 PM EST  Reason for Triage: Patient spouse Adrien called regarding patient - he's been coughing with some congestion since yesterday-- by last night, he started a low grade fever; today he is not coughing but has a severe headache (he has taken one of his migraine shots) with still some congestion/coughing.  His fever is up to 100.7. Please call her at [phone]   Reason for Disposition  Patient is HIGH RISK (e.g., 65 years and older, pregnant, HIV+, or chronic medical condition)  Answer Assessment - Initial Assessment Questions Spouse calling in to schedule appointment and/or request antiviral medication for Flu. Family just found out that patients brother in law and sister have tested positive for flu, they were with them over this past  weekend. Last evening Hank developed a cough with congestion, today has a fever of 100.7, mild cough, headache-took imitrex .  Scheduled acute visit today.   1. SYMPTOMS: What is your main symptom or concern? (e.g., cough, fever, shortness of breath, muscle aches)     cough 2. ONSET: When did the symptoms start?      02/10/24 3. COUGH: Do you have a cough? If Yes, ask: How bad is the cough?       mild 4. FEVER: Do you have a fever? If Yes, ask: What is your temperature, how was it measured, and when did it start?     100.7 5. BREATHING DIFFICULTY: Are you  having any difficulty breathing? (e.g., normal; shortness of breath, wheezing, unable to speak)       6. BETTER-SAME-WORSE: Are you getting better, staying the same or getting worse compared to yesterday?  If getting worse, ask, In what way?     unchanged 7. OTHER SYMPTOMS: Do you have any other symptoms?  (e.g., chills, fatigue, headache, loss of smell or taste, muscle pain, sore throat)     Headache, congestion, body aches 8. INFLUENZA EXPOSURE: Was there any known exposure to influenza (flu) before the symptoms began?      yes 9. INFLUENZA SUSPECTED: Why do you think you have influenza? (e.g., positive flu self-test at home, symptoms after exposure).     Exposure with symptoms 10. INFLUENZA VACCINE: Have you had the flu vaccine? If Yes, ask: When did you last get it?        11. HIGH RISK FOR COMPLICATIONS: Do you have any chronic medical problems? (e.g., asthma, heart or lung disease, obesity, weak immune system)       yes 12. PREGNANCY: Is there any chance you are pregnant? When was your last menstrual period?        13. O2 SATURATION MONITOR:  Do you use an oxygen saturation monitor (pulse oximeter) at home? If Yes, ask What is your reading (oxygen level) today? What is your usual oxygen saturation reading? (e.g., 95%)  Protocols used: Influenza (Flu) Suspected-A-AH

## 2024-02-11 NOTE — Progress Notes (Signed)
 "  Subjective:    Patient ID: Ian Golden, male    DOB: 01/28/1966, 58 y.o.   MRN: [id number]  Ian Golden is a very pleasant 58 y.o. male patient of Dr. Watt with a history of migraines, allergic rhinitis, CVA who presents today to discuss URI symptoms.  Symptom onset yesterday with fatigue and body aches. He then developed cough, chest congestion, fevers (t-max of 100.7). He has been exposed to several family members who have been diagnosed with influenza.   He completed his influenza vaccine on 12/03/2023. His wife Stuart Mirabile joins us  today who is a patient of our practice. She denies symptoms, but her oldest son has symptoms at home.    Review of Systems  Constitutional:  Positive for chills, fatigue and fever.  HENT:  Positive for congestion.   Respiratory:  Positive for cough.   Musculoskeletal:  Positive for myalgias.         Past Medical History:  Diagnosis Date   Allergic rhinitis, cause unspecified    Hemorrhoids    History of ischemic stroke 04/24/2023   Hyperlipidemia 05/21/2021   Migraine    Ulcer     Social History   Socioeconomic History   Marital status: Married    Spouse name: Adrien   Number of children: 3   Years of education: Not on file   Highest education level: Not on file  Occupational History    Employer: [employer]    Comment: Patent Examiner  Tobacco Use   Smoking status: Never   Smokeless tobacco: Never  Substance and Sexual Activity   Alcohol use: Never    Alcohol/week: 0.0 standard drinks of alcohol   Drug use: Never   Sexual activity: Yes    Partners: Female  Other Topics Concern   Not on file  Social History Narrative   Married, 3 adopted children   Hydrographic Surveyor   Regular exercise- no   Right handed   Social Drivers of Health   Tobacco Use: Low Risk (02/11/2024)   Patient History    Smoking Tobacco Use: Never    Smokeless Tobacco Use: Never    Passive Exposure: Not on file  Financial Resource Strain:  Not on file  Food Insecurity: No Food Insecurity (04/24/2023)   Hunger Vital Sign    Worried About Running Out of Food in the Last Year: Never true    Ran Out of Food in the Last Year: Never true  Transportation Needs: No Transportation Needs (04/24/2023)   PRAPARE - Administrator, Civil Service (Medical): No    Lack of Transportation (Non-Medical): No  Physical Activity: Not on file  Stress: Not on file  Social Connections: Not on file  Intimate Partner Violence: Not At Risk (04/24/2023)   Humiliation, Afraid, Rape, and Kick questionnaire    Fear of Current or Ex-Partner: No    Emotionally Abused: No    Physically Abused: No    Sexually Abused: No  Depression (PHQ2-9): Low Risk (06/12/2023)   Depression (PHQ2-9)    PHQ-2 Score: 0  Alcohol Screen: Not on file  Housing: Low Risk (04/24/2023)   Housing Stability Vital Sign    Unable to Pay for Housing in the Last Year: No    Number of Times Moved in the Last Year: 1    Homeless in the Last Year: No  Utilities: Not At Risk (04/24/2023)   AHC Utilities    Threatened with loss of utilities: No  Health Literacy:  Not on file    Past Surgical History:  Procedure Laterality Date   BUBBLE STUDY  05/23/2021   Procedure: BUBBLE STUDY;  Surgeon: Delford Maude BROCKS, MD;  Location: [facility] ENDOSCOPY;  Service: Cardiovascular;;   LOOP RECORDER INSERTION N/A 05/23/2021   Procedure: LOOP RECORDER INSERTION;  Surgeon: Inocencio Soyla Lunger, MD;  Location: [facility] INVASIVE CV LAB;  Service: Cardiovascular;  Laterality: N/A;   TEE WITHOUT CARDIOVERSION N/A 05/23/2021   Procedure: TRANSESOPHAGEAL ECHOCARDIOGRAM (TEE);  Surgeon: Delford Maude BROCKS, MD;  Location: [facility] ENDOSCOPY;  Service: Cardiovascular;  Laterality: N/A;   WISDOM TOOTH EXTRACTION     with sedation    Family History  Problem Relation Age of Onset   Colon polyps Father    Heart attack Mother    Heart disease Mother    Other Mother        coroner said there were signs of possible start of dementia     Colon cancer Neg Hx    Esophageal cancer Neg Hx    Rectal cancer Neg Hx    Stomach cancer Neg Hx     Allergies[1]  Medications Ordered Prior to Encounter[2]  BP 130/72   Pulse 80   Temp 98 F (36.7 C) (Oral)   Ht 6' 1.25 (1.861 m)   Wt 196 lb 4 oz (89 kg)   SpO2 95%   BMI 25.72 kg/m  Objective:   Physical Exam Constitutional:      Appearance: He is ill-appearing.  HENT:     Right Ear: Tympanic membrane and ear canal normal.     Left Ear: Tympanic membrane and ear canal normal.  Cardiovascular:     Rate and Rhythm: Normal rate and regular rhythm.  Pulmonary:     Effort: Pulmonary effort is normal.     Breath sounds: Normal breath sounds. No wheezing or rhonchi.  Skin:    General: Skin is warm and dry.     Physical Exam        Assessment & Plan:  Influenza A Assessment & Plan: Symptoms representative of viral illness.  Rapid influenza A test positive today. COVID-19 test negative.  Discussed options for treatment.  Start Tamiflu  75 mg twice daily x 5 days. We also discussed other conservative treatment at home. He kindly declines prescription cough suppressant.  Return precautions provided Will also provide prophylactic dose to wife who has been exposed.  Orders: -     Oseltamivir  Phosphate; Take 1 capsule (75 mg total) by mouth 2 (two) times daily.  Dispense: 10 capsule; Refill: 0  Acute cough -     POC COVID-19 BinaxNow -     POCT Influenza A/B    Assessment and Plan Assessment & Plan         Comer MARLA Gaskins, NP       [1] No Known Allergies [2]  Current Outpatient Medications on File Prior to Visit  Medication Sig Dispense Refill   Ascorbic Acid (VITAMIN C PO) Take 1 tablet by mouth daily.     aspirin  EC 81 MG tablet Take 1 tablet (81 mg total) by mouth daily. Swallow whole. 30 tablet 11   atorvastatin  (LIPITOR ) 80 MG tablet TAKE 1 TABLET (80 MG TOTAL) BY MOUTH DAILY. 90 tablet 3   fluoruracil (CARAC) 0.5 % cream Apply  topically as directed.     meloxicam  (MOBIC ) 15 MG tablet TAKE 1 TABLET (15 MG TOTAL) BY MOUTH DAILY. 30 tablet 0   ondansetron  (ZOFRAN -ODT) 4 MG disintegrating tablet Take 1  tablet (4 mg total) by mouth every 8 (eight) hours as needed for nausea or vomiting. 20 tablet 0   pantoprazole  (PROTONIX ) 40 MG tablet TAKE 1 TABLET BY MOUTH DAILY. 30 tablet 5   SUMAtriptan  6 MG/0.5ML SOAJ INJECT 0.5 MLS (6 MG TOTAL) INTO THE SKIN EVERY 2 (TWO) HOURS AS NEEDED FOR MIGRAINE OR HEADACHE. MAX 2 DOSES/24 HOURS 4.5 mL 5   No current facility-administered medications on file prior to visit.   "

## 2024-02-11 NOTE — Telephone Encounter (Signed)
 ext Appt With Family Medicine [place] MARLA Gaskins, NP) 02/11/2024 at 2:40 PM

## 2024-02-11 NOTE — Assessment & Plan Note (Addendum)
 Symptoms representative of viral illness.  Rapid influenza A test positive today. COVID-19 test negative.  Discussed options for treatment.  Start Tamiflu  75 mg twice daily x 5 days. We also discussed other conservative treatment at home. He kindly declines prescription cough suppressant.  Return precautions provided Will also provide prophylactic dose to wife who has been exposed.

## 2024-02-11 NOTE — Patient Instructions (Signed)
 Start Tamiflu  75 mg capsules for flu.  Take 1 capsule by mouth twice daily for 5 days.  You can try a few things over the counter to help with your symptoms including:  Cough: Delsym or Robitussin (get the off brand, works just as well) Chest Congestion: Mucinex  (plain) Nasal Congestion/Ear Pressure/Sinus Pressure: Try using Flonase  (fluticasone ) nasal spray. Instill 1 spray in each nostril twice daily. This can be purchased over the counter. Body aches, fevers, headache: Ibuprofen (not to exceed 2400 mg in 24 hours) or Acetaminophen -Tylenol  (not to exceed 3000 mg in 24 hours) Runny Nose/Throat Drainage/Sneezing/Itchy or Watery Eyes: An antihistamine such as Zyrtec, Claritin , Xyzal, Allegra  It was a pleasure meeting you!

## 2024-02-28 ENCOUNTER — Encounter

## 2024-02-29 ENCOUNTER — Ambulatory Visit

## 2024-02-29 DIAGNOSIS — I639 Cerebral infarction, unspecified: Secondary | ICD-10-CM | POA: Diagnosis not present

## 2024-03-02 ENCOUNTER — Other Ambulatory Visit: Payer: Self-pay | Admitting: Podiatry

## 2024-03-02 LAB — CUP PACEART REMOTE DEVICE CHECK
Date Time Interrogation Session: 20260109230754
Implantable Pulse Generator Implant Date: 20230404

## 2024-03-03 ENCOUNTER — Ambulatory Visit: Payer: Self-pay | Admitting: Cardiology

## 2024-03-03 NOTE — Progress Notes (Signed)
 Remote Loop Recorder Transmission

## 2024-03-30 ENCOUNTER — Encounter

## 2024-03-31 ENCOUNTER — Ambulatory Visit

## 2024-04-24 ENCOUNTER — Other Ambulatory Visit (HOSPITAL_COMMUNITY)

## 2024-04-30 ENCOUNTER — Encounter

## 2024-05-01 ENCOUNTER — Ambulatory Visit

## 2024-05-31 ENCOUNTER — Encounter

## 2024-06-01 ENCOUNTER — Ambulatory Visit

## 2024-07-01 ENCOUNTER — Encounter

## 2024-07-02 ENCOUNTER — Ambulatory Visit
